# Patient Record
Sex: Male | Born: 1945
Health system: Southern US, Community
[De-identification: ages and names within clinical notes are randomized; demographics above are authoritative.]

## PROBLEM LIST (undated history)

## (undated) DIAGNOSIS — E785 Hyperlipidemia, unspecified: Secondary | ICD-10-CM

## (undated) DIAGNOSIS — M519 Unspecified thoracic, thoracolumbar and lumbosacral intervertebral disc disorder: Secondary | ICD-10-CM

## (undated) DIAGNOSIS — L409 Psoriasis, unspecified: Secondary | ICD-10-CM

## (undated) DIAGNOSIS — E119 Type 2 diabetes mellitus without complications: Secondary | ICD-10-CM

## (undated) DIAGNOSIS — R251 Tremor, unspecified: Secondary | ICD-10-CM

## (undated) DIAGNOSIS — I10 Essential (primary) hypertension: Secondary | ICD-10-CM

## (undated) DIAGNOSIS — E669 Obesity, unspecified: Secondary | ICD-10-CM

## (undated) HISTORY — DX: Essential (primary) hypertension: I10

## (undated) HISTORY — DX: Type 2 diabetes mellitus without complications: E11.9

## (undated) HISTORY — DX: Unspecified thoracic, thoracolumbar and lumbosacral intervertebral disc disorder: M51.9

## (undated) HISTORY — DX: Obesity, unspecified: E66.9

## (undated) HISTORY — DX: Psoriasis, unspecified: L40.9

## (undated) HISTORY — DX: Hyperlipidemia, unspecified: E78.5

## (undated) HISTORY — PX: LEG SURGERY: SHX1003

## (undated) HISTORY — PX: BACK SURGERY: SHX140

## (undated) HISTORY — DX: Tremor, unspecified: R25.1

---

## 1987-08-09 HISTORY — PX: LUMBAR LAMINECTOMY: SHX95

## 1998-04-12 ENCOUNTER — Encounter: Payer: Self-pay | Admitting: Neurosurgery

## 1998-04-12 ENCOUNTER — Ambulatory Visit (HOSPITAL_COMMUNITY): Admission: RE | Admit: 1998-04-12 | Discharge: 1998-04-12 | Payer: Self-pay | Admitting: Neurosurgery

## 2000-05-21 ENCOUNTER — Ambulatory Visit (HOSPITAL_COMMUNITY): Admission: RE | Admit: 2000-05-21 | Discharge: 2000-05-21 | Payer: Self-pay | Admitting: Neurosurgery

## 2000-05-21 ENCOUNTER — Encounter: Payer: Self-pay | Admitting: Neurosurgery

## 2000-09-19 ENCOUNTER — Encounter: Admission: RE | Admit: 2000-09-19 | Discharge: 2000-12-18 | Payer: Self-pay | Admitting: Emergency Medicine

## 2003-11-26 ENCOUNTER — Inpatient Hospital Stay (HOSPITAL_COMMUNITY): Admission: RE | Admit: 2003-11-26 | Discharge: 2003-11-27 | Payer: Self-pay | Admitting: Neurosurgery

## 2005-08-19 ENCOUNTER — Encounter (HOSPITAL_BASED_OUTPATIENT_CLINIC_OR_DEPARTMENT_OTHER): Admission: RE | Admit: 2005-08-19 | Discharge: 2005-09-20 | Payer: Self-pay | Admitting: Surgery

## 2006-06-21 ENCOUNTER — Encounter: Admission: RE | Admit: 2006-06-21 | Discharge: 2006-06-21 | Payer: Self-pay | Admitting: Neurosurgery

## 2007-09-11 ENCOUNTER — Encounter: Admission: RE | Admit: 2007-09-11 | Discharge: 2007-09-11 | Payer: Self-pay | Admitting: Endocrinology

## 2007-10-29 ENCOUNTER — Inpatient Hospital Stay (HOSPITAL_COMMUNITY): Admission: EM | Admit: 2007-10-29 | Discharge: 2007-11-12 | Payer: Self-pay | Admitting: Emergency Medicine

## 2007-11-06 ENCOUNTER — Ambulatory Visit: Payer: Self-pay | Admitting: Infectious Disease

## 2007-11-12 ENCOUNTER — Ambulatory Visit: Payer: Self-pay | Admitting: Surgery

## 2007-11-12 ENCOUNTER — Encounter (INDEPENDENT_AMBULATORY_CARE_PROVIDER_SITE_OTHER): Payer: Self-pay | Admitting: Internal Medicine

## 2008-09-03 ENCOUNTER — Inpatient Hospital Stay (HOSPITAL_COMMUNITY): Admission: EM | Admit: 2008-09-03 | Discharge: 2008-09-18 | Payer: Self-pay | Admitting: Emergency Medicine

## 2008-09-04 ENCOUNTER — Encounter (INDEPENDENT_AMBULATORY_CARE_PROVIDER_SITE_OTHER): Payer: Self-pay | Admitting: Internal Medicine

## 2008-09-04 ENCOUNTER — Ambulatory Visit: Payer: Self-pay | Admitting: Vascular Surgery

## 2008-09-10 ENCOUNTER — Ambulatory Visit: Payer: Self-pay | Admitting: Infectious Diseases

## 2008-09-10 ENCOUNTER — Encounter (INDEPENDENT_AMBULATORY_CARE_PROVIDER_SITE_OTHER): Payer: Self-pay | Admitting: Pulmonary Disease

## 2008-09-23 DIAGNOSIS — Z872 Personal history of diseases of the skin and subcutaneous tissue: Secondary | ICD-10-CM | POA: Insufficient documentation

## 2008-09-23 DIAGNOSIS — I10 Essential (primary) hypertension: Secondary | ICD-10-CM | POA: Insufficient documentation

## 2008-09-23 DIAGNOSIS — E119 Type 2 diabetes mellitus without complications: Secondary | ICD-10-CM | POA: Insufficient documentation

## 2008-09-23 DIAGNOSIS — E785 Hyperlipidemia, unspecified: Secondary | ICD-10-CM | POA: Insufficient documentation

## 2008-09-23 DIAGNOSIS — M109 Gout, unspecified: Secondary | ICD-10-CM | POA: Insufficient documentation

## 2008-09-25 ENCOUNTER — Ambulatory Visit: Payer: Self-pay | Admitting: Internal Medicine

## 2008-09-25 DIAGNOSIS — L97909 Non-pressure chronic ulcer of unspecified part of unspecified lower leg with unspecified severity: Secondary | ICD-10-CM | POA: Insufficient documentation

## 2008-09-25 DIAGNOSIS — R42 Dizziness and giddiness: Secondary | ICD-10-CM | POA: Insufficient documentation

## 2008-10-21 ENCOUNTER — Ambulatory Visit: Payer: Self-pay | Admitting: Internal Medicine

## 2009-03-09 ENCOUNTER — Ambulatory Visit: Payer: Self-pay | Admitting: Surgery

## 2009-03-09 ENCOUNTER — Inpatient Hospital Stay (HOSPITAL_COMMUNITY): Admission: EM | Admit: 2009-03-09 | Discharge: 2009-03-13 | Payer: Self-pay | Admitting: Emergency Medicine

## 2009-03-10 ENCOUNTER — Encounter (INDEPENDENT_AMBULATORY_CARE_PROVIDER_SITE_OTHER): Payer: Self-pay | Admitting: Internal Medicine

## 2009-11-20 ENCOUNTER — Encounter: Admission: RE | Admit: 2009-11-20 | Discharge: 2009-11-20 | Payer: Self-pay | Admitting: Neurosurgery

## 2010-08-29 ENCOUNTER — Encounter: Payer: Self-pay | Admitting: Neurosurgery

## 2010-11-13 LAB — DIFFERENTIAL
Basophils Absolute: 0 10*3/uL (ref 0.0–0.1)
Basophils Absolute: 0 10*3/uL (ref 0.0–0.1)
Basophils Relative: 1 % (ref 0–1)
Eosinophils Absolute: 0 10*3/uL (ref 0.0–0.7)
Eosinophils Relative: 1 % (ref 0–5)
Lymphocytes Relative: 22 % (ref 12–46)
Lymphocytes Relative: 29 % (ref 12–46)
Lymphs Abs: 1.5 10*3/uL (ref 0.7–4.0)
Lymphs Abs: 1.8 10*3/uL (ref 0.7–4.0)
Monocytes Absolute: 0.5 10*3/uL (ref 0.1–1.0)
Monocytes Relative: 7 % (ref 3–12)
Monocytes Relative: 9 % (ref 3–12)
Neutro Abs: 3.8 10*3/uL (ref 1.7–7.7)
Neutro Abs: 5 10*3/uL (ref 1.7–7.7)
Neutrophils Relative %: 61 % (ref 43–77)
Neutrophils Relative %: 71 % (ref 43–77)

## 2010-11-13 LAB — CBC
HCT: 35.8 % — ABNORMAL LOW (ref 39.0–52.0)
HCT: 35.9 % — ABNORMAL LOW (ref 39.0–52.0)
Hemoglobin: 12.1 g/dL — ABNORMAL LOW (ref 13.0–17.0)
Hemoglobin: 14.2 g/dL (ref 13.0–17.0)
MCHC: 33.5 g/dL (ref 30.0–36.0)
MCHC: 34 g/dL (ref 30.0–36.0)
MCV: 83.8 fL (ref 78.0–100.0)
MCV: 83.9 fL (ref 78.0–100.0)
Platelets: 209 10*3/uL (ref 150–400)
Platelets: 242 10*3/uL (ref 150–400)
Platelets: 244 10*3/uL (ref 150–400)
RBC: 4.29 MIL/uL (ref 4.22–5.81)
RBC: 5.09 MIL/uL (ref 4.22–5.81)
RDW: 13.6 % (ref 11.5–15.5)
RDW: 13.7 % (ref 11.5–15.5)
WBC: 6.2 10*3/uL (ref 4.0–10.5)
WBC: 6.7 10*3/uL (ref 4.0–10.5)

## 2010-11-13 LAB — CULTURE, BLOOD (ROUTINE X 2): Culture: NO GROWTH

## 2010-11-13 LAB — GLUCOSE, CAPILLARY
Glucose-Capillary: 114 mg/dL — ABNORMAL HIGH (ref 70–99)
Glucose-Capillary: 122 mg/dL — ABNORMAL HIGH (ref 70–99)
Glucose-Capillary: 129 mg/dL — ABNORMAL HIGH (ref 70–99)
Glucose-Capillary: 147 mg/dL — ABNORMAL HIGH (ref 70–99)
Glucose-Capillary: 147 mg/dL — ABNORMAL HIGH (ref 70–99)
Glucose-Capillary: 151 mg/dL — ABNORMAL HIGH (ref 70–99)
Glucose-Capillary: 153 mg/dL — ABNORMAL HIGH (ref 70–99)
Glucose-Capillary: 158 mg/dL — ABNORMAL HIGH (ref 70–99)
Glucose-Capillary: 158 mg/dL — ABNORMAL HIGH (ref 70–99)
Glucose-Capillary: 165 mg/dL — ABNORMAL HIGH (ref 70–99)
Glucose-Capillary: 172 mg/dL — ABNORMAL HIGH (ref 70–99)
Glucose-Capillary: 200 mg/dL — ABNORMAL HIGH (ref 70–99)

## 2010-11-13 LAB — BASIC METABOLIC PANEL
BUN: 18 mg/dL (ref 6–23)
BUN: 34 mg/dL — ABNORMAL HIGH (ref 6–23)
Calcium: 9.8 mg/dL (ref 8.4–10.5)
Chloride: 104 mEq/L (ref 96–112)
GFR calc Af Amer: >60 mL/min (ref >60)
Glucose, Bld: 114 mg/dL — ABNORMAL HIGH (ref 70–99)
Glucose, Bld: 131 mg/dL — ABNORMAL HIGH (ref 70–99)
Potassium: 3.7 mEq/L (ref 3.5–5.1)
Potassium: 3.7 mEq/L (ref 3.5–5.1)
Sodium: 136 mEq/L (ref 135–145)
Sodium: 136 mEq/L (ref 135–145)

## 2010-11-13 LAB — COMPREHENSIVE METABOLIC PANEL
Albumin: 2.9 g/dL — ABNORMAL LOW (ref 3.5–5.2)
BUN: 31 mg/dL — ABNORMAL HIGH (ref 6–23)
CO2: 25 mEq/L (ref 19–32)
Calcium: 8.9 mg/dL (ref 8.4–10.5)
Chloride: 102 mEq/L (ref 96–112)
Creatinine, Ser: 1.37 mg/dL (ref 0.4–1.5)
Total Bilirubin: 0.5 mg/dL (ref 0.3–1.2)

## 2010-11-13 LAB — WOUND CULTURE

## 2010-11-13 LAB — MRSA CULTURE

## 2010-11-13 LAB — SEDIMENTATION RATE: Sed Rate: 65 mm/hr — ABNORMAL HIGH (ref 0–16)

## 2010-11-13 LAB — C-REACTIVE PROTEIN: CRP: 12.3 mg/dL — ABNORMAL HIGH (ref <0.6)

## 2010-11-13 LAB — HEMOGLOBIN A1C
Hgb A1c MFr Bld: 6.5 % — ABNORMAL HIGH (ref 4.6–6.1)
Mean Plasma Glucose: 140 mg/dL

## 2010-11-22 LAB — GLUCOSE, CAPILLARY
Glucose-Capillary: 142 mg/dL — ABNORMAL HIGH (ref 70–99)
Glucose-Capillary: 156 mg/dL — ABNORMAL HIGH (ref 70–99)
Glucose-Capillary: 176 mg/dL — ABNORMAL HIGH (ref 70–99)
Glucose-Capillary: 184 mg/dL — ABNORMAL HIGH (ref 70–99)

## 2010-11-22 LAB — LIPID PANEL
HDL: 25 mg/dL — ABNORMAL LOW (ref >39)
Total CHOL/HDL Ratio: 3.6 RATIO
Triglycerides: 176 mg/dL — ABNORMAL HIGH (ref <150)

## 2010-11-22 LAB — CBC
HCT: 33.4 % — ABNORMAL LOW (ref 39.0–52.0)
HCT: 34 % — ABNORMAL LOW (ref 39.0–52.0)
HCT: 40.3 % (ref 39.0–52.0)
Hemoglobin: 11.2 g/dL — ABNORMAL LOW (ref 13.0–17.0)
Hemoglobin: 11.4 g/dL — ABNORMAL LOW (ref 13.0–17.0)
Hemoglobin: 13.5 g/dL (ref 13.0–17.0)
MCHC: 33.4 g/dL (ref 30.0–36.0)
MCHC: 33.5 g/dL (ref 30.0–36.0)
MCHC: 33.6 g/dL (ref 30.0–36.0)
MCV: 83.8 fL (ref 78.0–100.0)
MCV: 84.2 fL (ref 78.0–100.0)
MCV: 84.5 fL (ref 78.0–100.0)
Platelets: 250 10*3/uL (ref 150–400)
Platelets: 390 10*3/uL (ref 150–400)
RBC: 3.98 MIL/uL — ABNORMAL LOW (ref 4.22–5.81)
RDW: 14.6 % (ref 11.5–15.5)
RDW: 14.7 % (ref 11.5–15.5)
RDW: 14.8 % (ref 11.5–15.5)
WBC: 16.6 10*3/uL — ABNORMAL HIGH (ref 4.0–10.5)

## 2010-11-22 LAB — BASIC METABOLIC PANEL
BUN: 11 mg/dL (ref 6–23)
BUN: 29 mg/dL — ABNORMAL HIGH (ref 6–23)
CO2: 19 mEq/L (ref 19–32)
CO2: 23 mEq/L (ref 19–32)
CO2: 25 mEq/L (ref 19–32)
Calcium: 8.3 mg/dL — ABNORMAL LOW (ref 8.4–10.5)
Calcium: 8.5 mg/dL (ref 8.4–10.5)
Chloride: 102 mEq/L (ref 96–112)
Chloride: 92 mEq/L — ABNORMAL LOW (ref 96–112)
Creatinine, Ser: 1.07 mg/dL (ref 0.4–1.5)
Creatinine, Ser: 1.08 mg/dL (ref 0.4–1.5)
GFR calc Af Amer: >60 mL/min (ref >60)
GFR calc Af Amer: >60 mL/min (ref >60)
GFR calc non Af Amer: >60 mL/min (ref >60)
Glucose, Bld: 182 mg/dL — ABNORMAL HIGH (ref 70–99)
Glucose, Bld: 193 mg/dL — ABNORMAL HIGH (ref 70–99)
Potassium: 3.1 mEq/L — ABNORMAL LOW (ref 3.5–5.1)
Potassium: 3.8 mEq/L (ref 3.5–5.1)
Sodium: 133 mEq/L — ABNORMAL LOW (ref 135–145)

## 2010-11-22 LAB — CULTURE, BLOOD (ROUTINE X 2): Culture: NO GROWTH

## 2010-11-22 LAB — WOUND CULTURE

## 2010-11-22 LAB — HEMOGLOBIN A1C
Hgb A1c MFr Bld: 6.5 % — ABNORMAL HIGH (ref 4.6–6.1)
Mean Plasma Glucose: 140 mg/dL

## 2010-11-22 LAB — DIFFERENTIAL
Basophils Absolute: 0 10*3/uL (ref 0.0–0.1)
Basophils Absolute: 0.2 10*3/uL — ABNORMAL HIGH (ref 0.0–0.1)
Basophils Relative: 0 % (ref 0–1)
Basophils Relative: 1 % (ref 0–1)
Eosinophils Absolute: 0 10*3/uL (ref 0.0–0.7)
Eosinophils Absolute: 0 10*3/uL (ref 0.0–0.7)
Eosinophils Absolute: 0 10*3/uL (ref 0.0–0.7)
Eosinophils Relative: 0 % (ref 0–5)
Eosinophils Relative: 0 % (ref 0–5)
Lymphocytes Relative: 7 % — ABNORMAL LOW (ref 12–46)
Lymphs Abs: 1.2 10*3/uL (ref 0.7–4.0)
Lymphs Abs: 1.3 10*3/uL (ref 0.7–4.0)
Monocytes Absolute: 0.3 10*3/uL (ref 0.1–1.0)
Monocytes Absolute: 1.2 10*3/uL — ABNORMAL HIGH (ref 0.1–1.0)
Monocytes Absolute: 1.7 10*3/uL — ABNORMAL HIGH (ref 0.1–1.0)
Monocytes Relative: 10 % (ref 3–12)
Neutro Abs: 15.7 10*3/uL — ABNORMAL HIGH (ref 1.7–7.7)

## 2010-11-23 LAB — GLUCOSE, CAPILLARY
Glucose-Capillary: 109 mg/dL — ABNORMAL HIGH (ref 70–99)
Glucose-Capillary: 113 mg/dL — ABNORMAL HIGH (ref 70–99)
Glucose-Capillary: 115 mg/dL — ABNORMAL HIGH (ref 70–99)
Glucose-Capillary: 127 mg/dL — ABNORMAL HIGH (ref 70–99)
Glucose-Capillary: 128 mg/dL — ABNORMAL HIGH (ref 70–99)
Glucose-Capillary: 146 mg/dL — ABNORMAL HIGH (ref 70–99)
Glucose-Capillary: 147 mg/dL — ABNORMAL HIGH (ref 70–99)
Glucose-Capillary: 148 mg/dL — ABNORMAL HIGH (ref 70–99)
Glucose-Capillary: 152 mg/dL — ABNORMAL HIGH (ref 70–99)
Glucose-Capillary: 163 mg/dL — ABNORMAL HIGH (ref 70–99)
Glucose-Capillary: 163 mg/dL — ABNORMAL HIGH (ref 70–99)
Glucose-Capillary: 163 mg/dL — ABNORMAL HIGH (ref 70–99)
Glucose-Capillary: 170 mg/dL — ABNORMAL HIGH (ref 70–99)
Glucose-Capillary: 172 mg/dL — ABNORMAL HIGH (ref 70–99)
Glucose-Capillary: 175 mg/dL — ABNORMAL HIGH (ref 70–99)
Glucose-Capillary: 177 mg/dL — ABNORMAL HIGH (ref 70–99)
Glucose-Capillary: 178 mg/dL — ABNORMAL HIGH (ref 70–99)
Glucose-Capillary: 188 mg/dL — ABNORMAL HIGH (ref 70–99)
Glucose-Capillary: 188 mg/dL — ABNORMAL HIGH (ref 70–99)
Glucose-Capillary: 190 mg/dL — ABNORMAL HIGH (ref 70–99)
Glucose-Capillary: 193 mg/dL — ABNORMAL HIGH (ref 70–99)
Glucose-Capillary: 195 mg/dL — ABNORMAL HIGH (ref 70–99)
Glucose-Capillary: 196 mg/dL — ABNORMAL HIGH (ref 70–99)
Glucose-Capillary: 198 mg/dL — ABNORMAL HIGH (ref 70–99)
Glucose-Capillary: 203 mg/dL — ABNORMAL HIGH (ref 70–99)
Glucose-Capillary: 212 mg/dL — ABNORMAL HIGH (ref 70–99)
Glucose-Capillary: 217 mg/dL — ABNORMAL HIGH (ref 70–99)

## 2010-11-23 LAB — BASIC METABOLIC PANEL
BUN: 13 mg/dL (ref 6–23)
BUN: 13 mg/dL (ref 6–23)
CO2: 25 mEq/L (ref 19–32)
CO2: 26 mEq/L (ref 19–32)
CO2: 27 mEq/L (ref 19–32)
CO2: 27 mEq/L (ref 19–32)
Calcium: 8.1 mg/dL — ABNORMAL LOW (ref 8.4–10.5)
Calcium: 8.2 mg/dL — ABNORMAL LOW (ref 8.4–10.5)
Calcium: 8.3 mg/dL — ABNORMAL LOW (ref 8.4–10.5)
Calcium: 8.7 mg/dL (ref 8.4–10.5)
Chloride: 93 mEq/L — ABNORMAL LOW (ref 96–112)
Chloride: 96 mEq/L (ref 96–112)
Chloride: 98 mEq/L (ref 96–112)
Creatinine, Ser: 0.98 mg/dL (ref 0.4–1.5)
Creatinine, Ser: 1.07 mg/dL (ref 0.4–1.5)
Creatinine, Ser: 1.14 mg/dL (ref 0.4–1.5)
GFR calc Af Amer: >60 mL/min (ref >60)
GFR calc Af Amer: >60 mL/min (ref >60)
GFR calc Af Amer: >60 mL/min (ref >60)
GFR calc Af Amer: >60 mL/min (ref >60)
GFR calc Af Amer: >60 mL/min (ref >60)
GFR calc non Af Amer: >60 mL/min (ref >60)
GFR calc non Af Amer: >60 mL/min (ref >60)
Glucose, Bld: 220 mg/dL — ABNORMAL HIGH (ref 70–99)
Glucose, Bld: 220 mg/dL — ABNORMAL HIGH (ref 70–99)
Potassium: 4.4 mEq/L (ref 3.5–5.1)
Sodium: 130 mEq/L — ABNORMAL LOW (ref 135–145)
Sodium: 131 mEq/L — ABNORMAL LOW (ref 135–145)
Sodium: 133 mEq/L — ABNORMAL LOW (ref 135–145)

## 2010-11-23 LAB — ANAEROBIC CULTURE

## 2010-11-23 LAB — DIFFERENTIAL
Basophils Absolute: 0 10*3/uL (ref 0.0–0.1)
Basophils Absolute: 0 10*3/uL (ref 0.0–0.1)
Basophils Absolute: 0 10*3/uL (ref 0.0–0.1)
Basophils Absolute: 0.1 10*3/uL (ref 0.0–0.1)
Basophils Relative: 0 % (ref 0–1)
Basophils Relative: 0 % (ref 0–1)
Basophils Relative: 1 % (ref 0–1)
Eosinophils Absolute: 0.1 10*3/uL (ref 0.0–0.7)
Eosinophils Absolute: 0.1 10*3/uL (ref 0.0–0.7)
Eosinophils Absolute: 0.1 10*3/uL (ref 0.0–0.7)
Eosinophils Absolute: 0.1 10*3/uL (ref 0.0–0.7)
Eosinophils Absolute: 0.2 10*3/uL (ref 0.0–0.7)
Eosinophils Relative: 1 % (ref 0–5)
Eosinophils Relative: 2 % (ref 0–5)
Lymphocytes Relative: 10 % — ABNORMAL LOW (ref 12–46)
Lymphocytes Relative: 10 % — ABNORMAL LOW (ref 12–46)
Lymphs Abs: 2.1 10*3/uL (ref 0.7–4.0)
Monocytes Absolute: 0.6 10*3/uL (ref 0.1–1.0)
Monocytes Absolute: 0.6 10*3/uL (ref 0.1–1.0)
Monocytes Absolute: 1.2 10*3/uL — ABNORMAL HIGH (ref 0.1–1.0)
Monocytes Absolute: 1.8 10*3/uL — ABNORMAL HIGH (ref 0.1–1.0)
Monocytes Relative: 10 % (ref 3–12)
Monocytes Relative: 10 % (ref 3–12)
Monocytes Relative: 8 % (ref 3–12)
Neutro Abs: 11.8 10*3/uL — ABNORMAL HIGH (ref 1.7–7.7)
Neutro Abs: 14.1 10*3/uL — ABNORMAL HIGH (ref 1.7–7.7)
Neutro Abs: 16 10*3/uL — ABNORMAL HIGH (ref 1.7–7.7)
Neutro Abs: 4.5 10*3/uL (ref 1.7–7.7)
Neutrophils Relative %: 82 % — ABNORMAL HIGH (ref 43–77)
Neutrophils Relative %: 82 % — ABNORMAL HIGH (ref 43–77)
Neutrophils Relative %: 83 % — ABNORMAL HIGH (ref 43–77)

## 2010-11-23 LAB — CBC
HCT: 30.2 % — ABNORMAL LOW (ref 39.0–52.0)
HCT: 33.4 % — ABNORMAL LOW (ref 39.0–52.0)
HCT: 33.8 % — ABNORMAL LOW (ref 39.0–52.0)
HCT: 34 % — ABNORMAL LOW (ref 39.0–52.0)
HCT: 34.6 % — ABNORMAL LOW (ref 39.0–52.0)
Hemoglobin: 10.4 g/dL — ABNORMAL LOW (ref 13.0–17.0)
Hemoglobin: 11.2 g/dL — ABNORMAL LOW (ref 13.0–17.0)
Hemoglobin: 11.3 g/dL — ABNORMAL LOW (ref 13.0–17.0)
Hemoglobin: 11.6 g/dL — ABNORMAL LOW (ref 13.0–17.0)
Hemoglobin: 11.7 g/dL — ABNORMAL LOW (ref 13.0–17.0)
Hemoglobin: 11.7 g/dL — ABNORMAL LOW (ref 13.0–17.0)
MCHC: 33.1 g/dL (ref 30.0–36.0)
MCHC: 33.4 g/dL (ref 30.0–36.0)
MCHC: 33.8 g/dL (ref 30.0–36.0)
MCHC: 33.8 g/dL (ref 30.0–36.0)
MCV: 83.2 fL (ref 78.0–100.0)
MCV: 83.5 fL (ref 78.0–100.0)
MCV: 83.6 fL (ref 78.0–100.0)
MCV: 83.6 fL (ref 78.0–100.0)
MCV: 83.7 fL (ref 78.0–100.0)
MCV: 83.8 fL (ref 78.0–100.0)
MCV: 83.9 fL (ref 78.0–100.0)
Platelets: 537 10*3/uL — ABNORMAL HIGH (ref 150–400)
Platelets: 554 10*3/uL — ABNORMAL HIGH (ref 150–400)
Platelets: 617 10*3/uL — ABNORMAL HIGH (ref 150–400)
Platelets: 647 10*3/uL — ABNORMAL HIGH (ref 150–400)
Platelets: 685 10*3/uL — ABNORMAL HIGH (ref 150–400)
Platelets: 689 10*3/uL — ABNORMAL HIGH (ref 150–400)
RBC: 3.59 MIL/uL — ABNORMAL LOW (ref 4.22–5.81)
RBC: 3.85 MIL/uL — ABNORMAL LOW (ref 4.22–5.81)
RBC: 3.94 MIL/uL — ABNORMAL LOW (ref 4.22–5.81)
RBC: 4.06 MIL/uL — ABNORMAL LOW (ref 4.22–5.81)
RBC: 4.11 MIL/uL — ABNORMAL LOW (ref 4.22–5.81)
RBC: 4.18 MIL/uL — ABNORMAL LOW (ref 4.22–5.81)
RDW: 14.7 % (ref 11.5–15.5)
RDW: 14.8 % (ref 11.5–15.5)
RDW: 15 % (ref 11.5–15.5)
RDW: 15 % (ref 11.5–15.5)
RDW: 15.1 % (ref 11.5–15.5)
WBC: 11 10*3/uL — ABNORMAL HIGH (ref 4.0–10.5)
WBC: 6.4 10*3/uL (ref 4.0–10.5)
WBC: 9.8 10*3/uL (ref 4.0–10.5)

## 2010-11-23 LAB — CULTURE, ROUTINE-ABSCESS

## 2010-11-23 LAB — URINALYSIS, ROUTINE W REFLEX MICROSCOPIC
Leukocytes, UA: NEGATIVE
Nitrite: NEGATIVE
Protein, ur: 30 mg/dL — AB
Specific Gravity, Urine: 1.018 (ref 1.005–1.030)
Urobilinogen, UA: 1 mg/dL (ref 0.0–1.0)

## 2010-11-23 LAB — CULTURE, BLOOD (ROUTINE X 2): Culture: NO GROWTH

## 2010-11-23 LAB — URINE MICROSCOPIC-ADD ON

## 2010-11-23 LAB — URINE CULTURE: Special Requests: NEGATIVE

## 2010-12-21 NOTE — Group Therapy Note (Signed)
James, Roberson NO.:  [id number]   MEDICAL RECORD NO.:  [id number]          PATIENT TYPE:  INP   LOCATION:  1509                         FACILITY:  [facility]   PHYSICIAN:  Altha Harm, MDDATE OF BIRTH:  1946-04-20                                 PROGRESS NOTE   DISCHARGE DIAGNOSES:  1. Cellulitis of right lower extremity.  2. Abscess of right lower extremity, status post incision and      drainage.  3. Diabetes type 2, uncontrolled.  4. Hypertension.  5. Leukocytosis.   DISCHARGE MEDICATIONS:  To be determined at the time of discharge.   CONSULTATIONS:  Dr. Johna Sheriff, surgery.   PROCEDURE:  1. Status post culture of right wound at time of admission.  2. I and D of abscess on the right lower extremity just inferior to      the right medial malleolus.   DIAGNOSTIC STUDIES:  1. X-ray of the right foot and leg, which shows diffuse soft tissue      swelling without acute or specific findings.  2. MR of the right lower extremity done on the 27th at admission,      which shows diffuse subcutaneous soft tissue swelling or edema      without discrete soft tissue lesions.  No findings for myofascitis      or osteomyelitis.  Findings suspicious for DVT.  Recommend venous      Doppler examination.  3. Chest x-ray, 2 view, done on February 1, which shows no pneumonia      and low lung volumes.  Repeat MR of the lower extremity done today,      February 2, which shows progressive right lower leg cellulitis.      Proximal to the surgical incision, medial of the ankle lesion,      there is an enlarging and ill-defined fluid collection within the      anterior and medial subcutaneous fat, suspicious for a superficial      abscess.  No evidence of deep abscess or osteomyelitis.  4. Doppler ultrasound of the lower extremities done on January 28th,      which shows no evidence of DVT, superficial thrombosis, or baker's      cyst.   CODE STATUS:  Full code.   ALLERGIES:  1. MOXIFLOXACIN.  2. ADHESIVE.   CHIEF COMPLAINT:  Rapidly progressive right lower extremity cellulitis over the past 48  hours prior to admission.   HISTORY OF PRESENT ILLNESS:  Please refer to the H and P, performed by Dr. Ardyth Harps, for details of  the HPI.   HOSPITAL COURSE:  1. Rapidly progressive right lower extremity cellulitis:  The patient      was admitted with right lower extremity cellulitis.  The patient      was started on vancomycin and Cefepime at the time of admission.      He initially had some improvement in the cellulitis of the right      lower extremity; however, within 48 hours, the patient had      developed an area of superficial abscess on the right leg  just      inferior to the medial malleolus.  I asked Dr. Derrell Lolling, who is on      call for surgery, to come back and see the patient.  He did a      bedside I and D on the patient with drainage of serosanguineous      fluid with no purulent fluids.  All of the cultures that were taken      thus far have been negative.  The patient has continued to have an      elevated white blood cell count, despite the antibiotics.      Antibiotics were changed from vancomycin and cefepime to vancomycin      and imipenem.  MRI was repeated on the lower extremity today, which      shows progression of the cellulitis and another abscess formation,      superficial, just above the area that was incised and drained on      the 31st.  I spoke to Dr. Johna Sheriff, and his plan is to take the      patient down to surgery for an I and D under anesthesia.  There is      no evidence of myofascitis or osteomyelitis at this time; however,      the cellulitis of the right lower extremity is quite impressive.      In light of the persistent leukocytosis, despite antibiotics, I      have asked Dr. Maurice March of infectious disease to see the patient, and I      anticipate a consult by him today on this patient.  We also looked      at a  chest x-ray to look for other sources of infection, and a      chest x-ray is negative.  A urinalysis is being sent on this      patient to hopefully evaluate his urine for evidence of a urinary      tract infection; however, I suspect that the persistent      leukocytosis is all related to the cellulitis of the right lower      extremity.  2. Diabetes type 2:  The patient is usually well controlled on his      usual regimen of medications; however, with his infection, his      diabetes has been out of control with his blood pressures going      into the 200's.  I am presently in the process of changing the      patient over to an insulin drip with a glucose stabilizer for      better control of his blood sugars.  3. Hypertension:  This has been well controlled during this      hospitalization.  4. Hyperlipidemia:  Patient is on his usual medications.   In terms of his diet, the patient has been on a medium carb-modified,  heart-healthy diet.  In terms of physical restrictions, the patient has  been essentially on bedrest with lower extremities elevated.  He has  been on GI and DVT prophylaxis with Lovenox and Protonix.   This brings [place] up to date on this patient's hospital course at this  point.      Altha Harm, MD  Electronically Signed     MAM/MEDQ  D:  09/09/2008  T:  09/09/2008  Job:  [id number]   cc:   [name], M.D.  Fax: [phone]

## 2010-12-21 NOTE — Op Note (Signed)
NAMEBASTION, BOLGER NO.:  [id number]   MEDICAL RECORD NO.:  [id number]          PATIENT TYPE:  INP   LOCATION:  1509                         FACILITY:  [facility]   PHYSICIAN:  Angelia Mould. Derrell Lolling, M.D.DATE OF BIRTH:  03-Mar-1946   DATE OF PROCEDURE:  09/07/2008  DATE OF DISCHARGE:                               OPERATIVE REPORT   PREOPERATIVE DIAGNOSIS:  Soft tissue abscess right ankle with  cellulitis.   POSTOPERATIVE DIAGNOSIS:  Soft tissue abscess right ankle with  cellulitis.   OPERATION PERFORMED:  Incision and drainage of right ankle soft tissue  abscess with debridement of skin and subcutaneous tissue.   SURGEON:  Angelia Mould. Derrell Lolling, M.D.   OPERATIVE INDICATIONS:  This is a 65 year old white male with non-  insulin-dependent diabetes mellitus.  He has a history of soft tissue  infections of his right lower extremity dating to March of 2009  requiring debridement of an ulcer along his medial ankle by Dr. Colin Benton at  that time.  He was readmitted this time on January 27.  He was seen by  Dr. Glenna Fellows who evaluated him and noted that he had cellulitis  of his right lower extremity from the knee down.  Doppler studies were  negative for deep venous thrombosis.  MRI is negative for deep space  infection.  I was on call this weekend and was called by the medical  hospitalist for a focal area on the medial right ankle with concern for  soft tissue infection.  I came to evaluate him shortly thereafter.   OPERATIVE TECHNIQUE:  I first removed all of the bandages and examined  the right lower extremity.  He has cellulitis from the knee down to the  foot.  The edema in the calf is resolving nicely.  He has significant  edema of the entire foot.  He has a small ulcer near the medial  malleolus from his previous debridement.  Inferior to this there is an  area of about 2.5 cm of palpable fluid collection.  There is no  overlying skin necrosis.  This area is  tender.   I then removed all of the bandages and prepped and draped the right  foot.  Xylocaine 1% with epinephrine was used as a local infiltration  anesthetic.  Using a knife I incised into this area and got a bloody  thick fluid without any odor.  A culture was obtained at this point.  I  then debrided all of the overlying skin and subcutaneous tissue which  made the wound almost 2 cm in diameter.  The abscess then tracked  superiorly and I simply slit the skin superiorly and folded it back so  that I could make sure that I had unroofed the entire abscess cavity.  This appeared to have taken  care of the entire abscess.  This was irrigated out and then packed with  saline moistened gauze.  We then redressed the entire extremity with  Silvadene on the chronic open areas.  The patient tolerated the  procedure well.  Estimated blood loss was about 5 mL.  Complications  none.      Angelia Mould. Derrell Lolling, M.D.  Electronically Signed     HMI/MEDQ  D:  09/07/2008  T:  09/07/2008  Job:  [id number]   cc:   Lorne Skeens. Hoxworth, M.D.  [address], [address]  [place]  [place] [address]

## 2010-12-21 NOTE — Consult Note (Signed)
NAMEMarland Roberson  ALIM, CATTELL NO.:  [id number]   MEDICAL RECORD NO.:  [id number]          PATIENT TYPE:  EMS   LOCATION:  ED                           FACILITY:  [facility]   PHYSICIAN:  Lorne Skeens. Hoxworth, M.D.DATE OF BIRTH:  [birth year]   DATE OF CONSULTATION:  DATE OF DISCHARGE:                                 CONSULTATION   CHIEF COMPLAINT:  Pain, swelling, and redness right lower extremity.   HISTORY OF PRESENT ILLNESS:  I was asked by the emergency room PA at  [facility] to evaluate James Roberson.  He is a 65 year old white male with  non-insulin dependent diabetes mellitus.  He has a history of a soft  tissue infection of his right lower extremity in March 2009.  This  apparently was MRSA, according to records, and he did require  debridement of an ulcer along his medial right ankle by Dr. Colin Benton at  that time.  The infection cleared up although the patient states the  wound has never healed and he has been followed in PT and by Dr. Juleen China.  He states he was in his usual state of health until just about 24 hours  ago when he noted some redness and tenderness along his posterior right  calf.  He saw Dr. Juleen China, at that time and was started on oral Keflex.  He states over the last 24 hours he has had rapid worsening of his leg  with increasing redness, swelling and pain involving essentially his  entire right lower extremity from the knee down, and then more recently  some redness around his medial thigh and groin as well.  He has had  fever, he states up to 102 degrees at home.  He presented to the  emergency room this afternoon.  He is being admitted by [facility]  [facility].  I was asked to see the patient in consultation.   PAST MEDICAL HISTORY:  Surgery, is significant as above, plus history of  cervical laminectomy and lumbar laminectomy on 3 occasions in years  past.  Medically, he is followed for diabetes, hypertension, gout,  hyperlipidemia.   MEDICATIONS ON  ADMISSION:  1. Aleve twice daily.  2. Allopurinol 100 mg daily  3. Metformin HCl ER 500 mg twice daily.  4. Ambien 10 mg at bedtime.  5. Cephalexin started yesterday.  6. Diovan 320/25 daily.  7. Januvia 100 mg daily.  8. Lipitor 40 daily.  9. Soriatane 25 mg daily.  10.Vicodin p.r.n.   ALLERGIES:  AVELOX has caused anaphylaxis.   SOCIAL HISTORY:  He is married, accompanied by his wife.  Nonsmoker.  Occasional alcohol.   FAMILY HISTORY:  Noncontributory.   REVIEW OF SYSTEMS:  GENERAL:  Positive for fever and malaise with this  illness.  HEENT:  No vision, hearing, swallowing problems.  RESPIRATORY:  Some mild cough, nonproductive no shortness of breath.  CARDIAC:  No  chest pain, palpitations and denies history of heart disease.  ABDOMEN:  Some nausea with this illness.  No abdominal pain or change in bowels.  EXTREMITIES:  As above.  NEUROLOGIC:  No numbness, weakness, syncope.  PHYSICAL EXAM:  VITAL SIGNS:  Temperature is 97.5, heart rate 114,  respirations 20, blood pressure 134/84.  GENERAL:  Alert, well developed and in no acute distress.  SKIN:  Warm and dry.  See extremities.  HEENT:  No palpable mass or thyromegaly.  Sclerae nonicteric.  Oropharynx clear.  LUNGS:  Clear without wheezing, increased work of breathing with no  cervical, subclavicular or inguinal nodes palpable.  CARDIAC:  Regular tachycardia.  No murmur.  No JVD.  ABDOMEN:  Mildly obese, soft, nontender.  No masses.  EXTREMITIES:  There is marked cellulitis involving the right lower  extremity from the knee to the ankle with relative sparing of the foot.  There is 2+ edema of the same area.  There is superficial desquamation  over the calf.  There are a couple of small superficial bullae along the  lateral ankle with apparent purulent material beneath these.  There is a  2 cm clean base chronic-appearing ulcer of the medial ankle generally  away from the center of the cellulitis and this appears clean.   There is  no apparent necrotic tissue on exam.  His calf is moderately tender.  NEUROVASCULAR:  Intact with good dorsiflexion and plantar flexion.  Good  sensation of the foot and are bounding pedal pulses.   LABORATORY:  White count of 17.4, hemoglobin 13.5, platelets 250,000.  Electrolytes BUN and creatinine abnormal for BUN of 29, glucose is 182.  Plain tib-fib x-rays of right lower extremity negative except for soft  tissue swelling.   ASSESSMENT/PLAN:  Severe cellulitis of the right lower extremity, rapid  onset over the past 24 hours in this diabetic patient with history of  methicillin-resistant Staphylococcus aureus infection of the right lower  extremity.  This likely represents a methicillin-resistant  Staphylococcus aureus or strep infection.  At this point I do not see  any necrotic tissue or indication for immediate debridement.  I am going  to I and D the superficial bulla on the lateral right ankle and culture  this fluid.  Will obtain MRI of the lower extremity, look for any  evidence of deep abscess.  I would get Doppler ultrasound to look for  any evidence of DVT.  The patient being started on broad-spectrum  antibiotics to include gram positive coverage with vancomycin.  Will be  observed closely.  If he fails to respond quickly, may well require  surgical exploration and/or debridement.      Lorne Skeens. Hoxworth, M.D.  Electronically Signed     BTH/MEDQ  D:  09/03/2008  T:  09/03/2008  Job:  [id number]

## 2010-12-21 NOTE — Discharge Summary (Signed)
James Roberson, James Roberson NO.:  [id number]   MEDICAL RECORD NO.:  [id number]          PATIENT TYPE:  INP   LOCATION:  1304                         FACILITY:  [facility]    PHYSICIAN:  Hillery Aldo, M.D.   DATE OF BIRTH:  23-Jul-1946   DATE OF ADMISSION:  03/09/2009  DATE OF DISCHARGE:  03/13/2009                               DISCHARGE SUMMARY   PRIMARY CARE PHYSICIAN:  [name], M.D.   DERMATOLOGIST:  Ria Bush. Jorja Loa, M.D.   DISCHARGE DIAGNOSES:  1. 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  2. Pustular psoriasis.  3. Diabetes mellitus.  4. Hypertension.  5. Dyslipidemia.  6. History of gout.  7. Stage 2 chronic kidney disease.  8. Hypokalemia.  9. Mild liver function tests elevation.  10.Normocytic anemia.   DISCHARGE MEDICATIONS:  1. Avelox 400 mg daily x2 weeks.  2. Bactroban topically b.i.d. p.r.n. to any open sores.  3. Soriatane 25 mg p.o. daily.  4. Methoxsalen 1% with phototherapy topically 3 times per week.  5. Lipitor 40 mg p.o. daily.  6. Metformin 500 mg p.o. b.i.d.  7. Januvia 100 mg p.o. daily.  8. Allopurinol 100 mg p.o. daily.  9. Diovan/HCT 320/25 one tablet p.o. daily.  10.Percocet 5/325 two tablets p.o. q.6 h. p.r.n. pain.  11.Ambien 10 mg p.o. q.h.s. p.r.n. sleep.  12.Vitamin D 1000 international units p.o. daily.  13.Fish oil 1200 mg p.o. daily.  14.Aleve 2 tablets p.o. b.i.d. p.r.n.   CONSULTATIONS:  Vania Rea. Supple, M.D. of Orthopedics.   BRIEF ADMISSION HISTORY OF PRESENT ILLNESS:  The patient is a 65-year-  old male with past medical history of right lower extremity infection  with cellulitis, diabetic foot ulcer involving this area with a history  of incision and drainage as well as debridement of the subcutaneous  tissue and fascia dating back to November 05, 2007.  The patient presented  to the hospital for evaluation of increasing erythema and pain of the  right lower  extremity.  Upon review of his old medical records, initial  cultures dating back to October 29, 2007 were positive for methicillin-  sensitive staph aureus.  All subsequent cultures have been negative.  The patient was referred to the hospitalist service for IV antibiotics  for treatment of acute cellulitis.  For the full details, please see the  dictated report done by Dr. Selena Batten.   PROCEDURES/DIAGNOSTIC STUDIES:  1. Right ankle films on March 09, 2009 showed soft tissue swelling.  2. Right tibia-fibula films on March 09, 2009 showed no acute bony      abnormalities of the right tibia or fibula.  3. Right lower extremity Doppler studies on March 10, 2009 were      negative for DVT or superficial thrombosis.  4. MRI of the right lower extremity on March 10, 2009 showed diffuse      subcutaneous edema and mild enhancement compatible with      residual/recurrent cellulitis.  Compared with prior examination 6      months earlier, this has improved.  No evidence of significant  myofasciitis or soft tissue abscess.  No evidence of osteomyelitis.   LABORATORY DATA:  Discharge laboratory values:  Nasal staph screen was  negative for staph aureus.  Wound cultures grew broadly sensitive staph  aureus.  The only antibiotic it was resistant to was penicillin.  Blood  cultures were negative x2.  Sodium was 136, potassium 3.7, chloride 104,  bicarb 26, BUN 18, creatinine 1.22, glucose 131, calcium 8.9, white  blood cell count was 6.7, hemoglobin 12.1, hematocrit 35.9, platelets  242.  C. reactive protein was elevated at 12.3.  Hemoglobin A1c was  6.5%.  ESR was 65.   HOSPITAL COURSE:  1. Right lower extremity cellulitis:  The patient has recurrent      methicillin-sensitive staph aureus cellulitis.  He was initially      empirically treated with broad-spectrum antibiotic therapy      including vancomycin and Zosyn.  When culture results were      finalized, his antibiotic therapy was narrowed to  vancomycin.      Doppler studies were negative for concurrent deep venous      thrombosis.  The patient has severe pustular psoriasis and has skin      that sloughs and occasionally develops denuded skin which serves as      the portal of entry.  Given this as well as his diabetes, he s at      risk for recurrent problems with infection.  At this time, we do      recommend 2 weeks of empiric treatment with Avelox.  If he develops      any further problems with cellulitis, would consider long-term      suppressive therapy.  In the interim, would recommend attempts at      decontamination of carrier status with biweekly bleach baths, one      full tub bath with one-quarter cup of bleach, daily showers with      antibacterial soap, and Bactroban ointment to any open sores.  The      patient was encouraged to continue to travel and if necessary, to      consult with his primary care physician for a prescription of      antibiotics to take with him should he develop any problems with      recurrent cellulitis.  2. Psoriasis:  The patient has severe pustular psoriasis.  He is under      the care of Dr. Jorja Loa and receives [employer].  Telephone      consultation was made with Dr. Jorja Loa regarding appropriate      treatment.  Dr. Jorja Loa did recommend ongoing therapy with Soritane      given that it has no immunosuppressive effects.  He also      recommended the twice weekly tub baths with one-quarter cup of      bleach.  The patient was maintained on Soritane and appears to have      reasonably good control of his outbreak.  3. Diabetes:  The patient does have excellent outpatient control given      his hemoglobin A1c of 6.5%.  He had some elevation of blood      glucoses while in the hospital which is likely attributable to his      acute infection.  The patient was put on moderate sliding-scale      insulin while in the hospital an addition to his metformin.  4. Hypertension:  The patient's  blood pressure was well controlled  throughout his hospital stay.  The hydrochlorothiazide was held      secondary to mild renal insufficiency.  This is felt to be chronic      in nature.  5. Dyslipidemia:  The patient was maintained on his usual dose of      statin and fish oil therapy.  6. Gout:  The patient was maintained on Allopurinol with no acute      gouty flares during this hospital stay.  7. Stage II chronic kidney disease:  The patient does have mild renal      insufficiency consistent with a diagnosis of stage II chronic      kidney disease.  The patient's hydrochlorothiazide was held.  His      creatinine decreased from 1.37-1.22 where it stabilized.  His      underlying stage II chronic kidney disease is likely due to      diabetic nephropathy.  He should be monitored closely as an      outpatient with appropriate referral to a nephrologist as needed.  8. Hypokalemia:  The patient's potassium was appropriately repleted.  9. Mild LFT elevation:  The patient did have liver function tests done      on admission which showed mild elevation of LFTs.  This is most      likely a side effect of Soritane.  He is also on statin therapy.      His transaminases were approximately two times normal and not      indicative of discontinuing medications.  10.Normocytic anemia:  The patient does have mild normocytic anemia      which is likely due to chronic disease.  No further diagnostic      evaluation was undertaken during the course of his hospital stay.   DISPOSITION:  The patient is medically stable.   DISCHARGE INSTRUCTIONS:  He will be discharged home with an additional 2  weeks of therapy with Avelox.  He is instructed to apply Bactroban to  any open sores two times per day.  He is instructed to bathe in a full  tub of water with one-quarter cup of bleach two times per week.  He is  encouraged to shower daily with antibacterial soap.  He is encouraged to  contact his primary  care physician for any plans to travel out of the  country and to receive a supply of antibiotic therapy for travel  purposes should he have a recurrence of cellulitis.  He is instructed to  follow up with Dr. Jorja Loa as usual and to follow up with Dr. Juleen China as  needed.   DISCHARGE DIET:  Diabetic, low-sodium, heart-healthy.   ACTIVITY:  No restrictions on activity.   CONDITION ON DISCHARGE:  Improved.   Time spent coordinating care for discharge and discharge instructions  equals 35 minutes.      Hillery Aldo, M.D.  Electronically Signed     CR/MEDQ  D:  03/13/2009  T:  03/13/2009  Job:  [id number]   cc:   [name], M.D.  Fax: [phone]   Ria Bush. Jorja Loa, M.D.  Fax: [phone]

## 2010-12-21 NOTE — Op Note (Signed)
[name], [name] NO.:  [id number]   MEDICAL RECORD NO.:  [id number]          PATIENT TYPE:  INP   LOCATION:  1509                         FACILITY:  [facility]   PHYSICIAN:  Sharlet Salina T. Hoxworth, M.D.DATE OF BIRTH:  May 01, 1946   DATE OF PROCEDURE:  09/16/2008  DATE OF DISCHARGE:                               OPERATIVE REPORT   PREOPERATIVE DIAGNOSIS:  Subcutaneous abscesses right lower extremity  surgery.   POSTOPERATIVE DIAGNOSIS:  Subcutaneous abscesses right lower extremity.   SURGICAL PROCEDURE:  Incision, drainage and debridement of subcutaneous  abscesses right lower extremity.   SURGEON:  B. Hoxworth, M.D.   ANESTHESIA:  General.   BRIEF HISTORY:  Mr. James Roberson is a 65 year old male admitted September 03, 2008 with very severe cellulitis of the right lower extremity.  He is  diabetic.  His cellulitis has gradually improved over the last couple of  weeks, but diffuse severe cellulitis has coalesced into several  subcutaneous pockets of purulent material which required incision and  drainage.  He continues to improve overall, but now has persistent  tenderness, redness and fluctuance of the posterior mid calf with a  smaller area over the mid tibia as well.  I have recommended incision  and drainage in the operating room.  He is brought to the OR for this  procedure.   DESCRIPTION OF OPERATION:  The patient was brought to the operating room  and placed supine position on the operating table and general  orotracheal anesthesia was induced.  He was carefully positioned and  padded in a frog-leg position so that the circumference of the right  extremity could be prepped from the knee to the ankle and sterilely  draped.  Correct patient and procedure were verified.  He was already on  broad-spectrum antibiotics.  I made a longitudinal incision over the  larger pocket in the mid calf and a large discrete pocket of thick  purulent material and necrotic  subcutaneous tissue was entered.  This  was all suctioned.  All pockets mechanically broken up and debrided.  This all was in the subcutaneous tissue from apparent subcu necrosis  with viable skin and underlying muscle.  Again, all loculations were  broken up and the cavity ended up being about 10 cm in length x 5-6 cm  in width over the posterior calf.  This was done to about a 4-5 cm  incision.  The wound was thoroughly irrigated and packed with 1-inch  Iodoform gauze.  There was a smaller area directly anterior over the  shin as well that was incised and about a 4 cm pocket of similar  material entered, cultured, drained, debrided, irrigated and packed as  well.  The other wounds on his leg appeared very clean and there was no  other evidence of abscesses.  The wounds were dressed with 4x4 and  Kerlix and he was taken to the recovery room in good condition.      Lorne Skeens. Hoxworth, M.D.  Electronically Signed     BTH/MEDQ  D:  09/16/2008  T:  09/16/2008  Job:  [id number]

## 2010-12-21 NOTE — Op Note (Signed)
NAME:  NEWEL, OIEN NO.:  [id number]   MEDICAL RECORD NO.:  [id number]          PATIENT TYPE:  INP   LOCATION:  5157                         FACILITY:  [facility]   PHYSICIAN:  Alfonse Ras, MD   DATE OF BIRTH:  1945-11-16   DATE OF PROCEDURE:  11/05/2007  DATE OF DISCHARGE:                               OPERATIVE REPORT   PREOPERATIVE DIAGNOSES:  Soft tissue infection, diabetic ulcer of the  right lower extremity.   POSTOPERATIVE DIAGNOSES:  Soft tissue infection, diabetic ulcer of the  right lower extremity.   PROCEDURE:  Incision and drainage and debridement of skin and  subcutaneous tissue and fascia.   ANESTHESIA:  General laryngeal mask.   SURGEON:  Alfonse Ras, M.D.   DESCRIPTION:  The patient was taken to the operating room, placed in a  supine position.  After adequate general anesthesia was induced using  laryngeal mask, the right lower extremity was prepped and draped in a  normal sterile fashion.  Using a circular incision around the previous  ulcer, dead subcutaneous tissue and fascia were debrided with blunt  dissection and vigorously pulse lavaged.  Tissue cultures were sent.  This was packed with Betadine-soaked Kerlix and wrapped with a Kerlix  wrap.  The patient tolerated the procedure well and went to PACU in good  condition.      Alfonse Ras, MD  Electronically Signed     KRE/MEDQ  D:  11/05/2007  T:  11/06/2007  Job:  [id number]

## 2010-12-21 NOTE — Discharge Summary (Signed)
NAMEBOWMAN, HIGBIE NO.:  [id number]   MEDICAL RECORD NO.:  [id number]          PATIENT TYPE:  INP   LOCATION:  1509                         FACILITY:  [facility]   PHYSICIAN:  Charlestine Massed, MDDATE OF BIRTH:  08-02-1946   DATE OF ADMISSION:  09/03/2008  DATE OF DISCHARGE:  09/18/2008                               DISCHARGE SUMMARY   PRIMARY CARE PHYSICIAN:  1. Dr. Juleen China.  2. Infectious disease specialist, Dr. Orvan Falconer.  3. Surgeon, Dr. Johna Sheriff.   REASON FOR ADMISSION:  Right lower extremity cellulitis.   HOSPITAL COURSE:  Mr. Charlis Harner is pleasant 65 year old Caucasian  male who has prior history of type 2 diabetes, psoriasis and history of  MRSA cellulitis of right ankle in April of 2009, who came with rapidly  progressive right lower extremity cellulitis over the past 24 hours.  The patient noticed he had a small blister that was draining on the  right calf also, and leg was erythematous and edematous.  The patient  was on cephalexin at home as per advice of his PMD, but the infection  did not respond to that.  He was having high fevers of 103.   The patient was diagnosed with diagnosed with cellulitis/fasciitis  secondary to infection, and he was started on vancomycin and broad-  spectrum antibiotic coverage, and MRI was also ordered.  The patient had  a surgical consult.   Surgery did a debridement of the right lower extremity on January 31 and  was found to have fluid collection inside and a small opening in the  medial malleolus, and debridement was done.  The patient had repeated  debridement done on February 2 and on February 9 after which the patient  was being seen by Dr. Johna Sheriff of surgery, and regular dressing has been  advised.   So far, all the blood cultures, urine cultures and wound cultures taken  during surgery have reportedly shown no growth.  The patient was started  on antibiotics right away, but the cultures are all still  out so far.  The patient has been continued on vancomycin and has completed a 14-day  course of vancomycin so far and has completed a 7-day course of  Augmentin also so far.   Infectious disease specialist, Dr. Orvan Falconer, had been called for consult  from the beginning, and he has been following this case.  Currently, the  patient's antibiotic coverage was put on as per his advice.   The patient has been seen by surgery today and has been advised for  discharge to continue home care with wound dressing daily, and Dr.  Orvan Falconer was consulted about discharge medications, and Dr. Orvan Falconer  concluded that there is no need for any further outpatient medication as  the patient has completed a full course of antibiotics here in the  hospital 14 days.  The patient is being discharged with previous  medications as he was taking before and with the new medications for  diabetes started during this admission.   DISCHARGE DIAGNOSES:  1. Right lower extremity cellulitis.  All cultures have been negative  so far.  2. Diabetes.  3. Hypertension.  4. History of psoriasis, gout and dyslipidemia.   DISCHARGE MEDICATIONS:  New medications on discharge are:  1. Percocet 5/325 two tablets p.o. q.6 h. p.r.n. for pain.  2. Lantus insulin 25 units at bedtime subcutaneously.  3. NovoLog insulin coverage q.a.c. every night sensitive scale dosing.  4. Dilantin 160 mg p.o. daily.   The previous medication Diovan/hydrochlorothiazide combination was  stopped.   The existing medications to be continued are:  1. Allopurinol 100 mg daily.  2. Metformin 500 mg b.i.d.  3. Ambien 10 mg p.o. every night.  4. Fish oil 1200 mg p.o. daily.  5. Vitamin D 1000 mg daily.  6. Januvia 100 mg daily.  7. Lipitor 40 mg daily.  8. Soriatane 25 mg daily.   The medications to be stopped are Aleve, Keflex,  Diovan/hydrochlorothiazide combination and Vicodin.   DISCHARGE INSTRUCTIONS:  1. Fall precautions to be  observed, walk with assistance, use crutches      for walking.  2. Daily dressing as mentioned by surgery with saline gauze by home      care.  Home care has been set up.  3. Proper insulin dosing and blood sugar control.   FOLLOW UP:  1. Follow up with Dr. Johna Sheriff within 2 weeks; call [phone] for      appointment.  2. Follow up with Dr. Orvan Falconer on February 18; appointment will be      been given to the patient.  3. Please see the primary doctor, Dr. Juleen China, in 3 weeks.   DISPOSITION:  Being discharged back home with home care.   A total of spent on the discharge, home care setup and  education the patient.      Charlestine Massed, MD  Electronically Signed     UT/MEDQ  D:  09/18/2008  T:  09/18/2008  Job:  [id number]   cc:   [name], M.D.  Fax: [phone]   Cliffton Asters, M.D.  Fax: [phone]   Lorne Skeens. Hoxworth, M.D.  [address], [address]  [place]  [place] [address]

## 2010-12-21 NOTE — Discharge Summary (Signed)
NAMEYISHAI, REHFELD NO.:  [id number]   MEDICAL RECORD NO.:  [id number]          PATIENT TYPE:  INP   LOCATION:  5157                         FACILITY:  [facility]   PHYSICIAN:  Lonia Blood, M.D.DATE OF BIRTH:  03/10/1946   DATE OF ADMISSION:  10/29/2007  DATE OF DISCHARGE:  11/15/2007                               DISCHARGE SUMMARY   PRIMARY CARE PHYSICIAN:  [name], M.D.   DISCHARGE DIAGNOSES:  1. Severe right lower extremity diabetic cellulitis with abscess.      a.     Status post incision and drainage with debridement.      b.     Methicillin susceptible staph aureus by culture.      c.     Much improved on IV antibiotics.      d.     Outpatient wound care arranged.      e.     Normal ABIs confirmed during hospital stay  2. Diabetes mellitus.  3. Hyperlipidemia.  4. Hypertension.  5. Gout.  6. Psoriasis.  7. Status post lumbar laminectomy, L3-L4, with diskectomy.   DISCHARGE MEDICATIONS:  1. Keflex 500 mg p.o. q.i.d. through November 17, 2007; then discontinue.  2. MS Contin 60 mg p.o. b.i.d. - #40 given.  3. Morphine sulfate 15 mg one to two p.o. q.3h. p.r.n. pain - #60      given.  4. Soriatane 25 mg p.o. daily.  5. Dovonex cream applied twice daily.  6. Lipitor 40 mg q.h.s.  7. Amaryl 1 mg p.o. daily.  8. Metformin 500 mg p.o. b.i.d.  9. Januvia 100 mg p.o. daily.  10.Allopurinol 100 mg p.o. daily.  11.Diovan 320/25 daily.  12.Ambien 10 mg p.o. q.h.s. p.r.n.  13.Vitamin D 1000 mg p.o. daily.  14.Fish oil 1200 mg p.o. daily.   FOLLOW UP:  1. Patient is advised to call Dr. Michaelle Birks to arrange for follow-      up appointment at [phone].  He should be seen within 7-10 days for      reevaluation of his foot wound.  2. The patient is advised to follow up with Dr. Juleen China within 7-10 days      for recheck of his diabetes and to assure that he is progressing      well from the standpoint of his other chronic medical problems.   WOUND  CARE:  The patient is to continue wet-to-dry dressings to his  right foot wound on a b.i.d. basis.  The patient is also scheduled for  daily pulsatile lavage at an outpatient facility.  This wound care is to  continue until the patient is instructed otherwise by Dr. Colin Benton.  The  patient's lower extremity should be wrapped with an Ace wrap, snug from  toes up to his knee.   CONSULTATIONS:  1. General surgery - Dr. Baruch Merl.  2. Infectious disease.   PROCEDURES:  1. Incision and drainage with debridement of skin and subcutaneous      tissue and fascia, November 05, 2007, of the right foot.  2. Bilateral lower extremity ABIs accomplished on November 12, 2007 - 1.2      bilaterally.  3. MRI of the right foot, November 04, 2007 - progression of inflammatory      process within the distal right ankle.  An ulceration is identified      with an underlying subcutaneous abscess measuring 3.0 cm superior      to inferior.  Phlegmonous inflammatory change tract cephalad.  Deep      abscess inflammation of the flexor hallicis muscle which is      reflective of underlying myositis.  No osseous involvement      appreciated.  4. MRI of the right extremity on October 30, 2007 - soft tissue edema      consistent with cellulitis.  No abscess or osteomyelitis.   HOSPITAL COURSE:  Mr. Megan Hayduk is a pleasant 65 year old gentleman  with a complex medical history as detailed above.  He presented to the  hospital on the date of admission with complaints of severe right lower  extremity swelling and erythema.  The patient had been undergoing  outpatient treatment with Augmentin for cellulitis but this clearly had  failed.  The patient was admitted to the acute units.  MRI revealed  significant severe cellulitis but initially did not suggest an actual  abscess.  The patient was placed on empiric IV antibiotics.  He failed  to improve.  As a result, repeat MRI was carried out.  This time, there  was evidence  to suggest an abscess as noted above.  The patient was  taken to the OR.  Incision and drainage and debridement was carried out.  The patient tolerated the procedure well.  Postoperative wound care was  directed by the general surgery service.  ID was consulted to assure  that the patient was on appropriate antibiotic therapy.  Wound cultures  in fact did return positive for methicillin-susceptible Staphylococcus  aureus.  The patient's antibiotics were narrowed accordingly.  The  patient tolerated IV Ancef without difficulty.  He continued to improve  clinically.  Arrangements were made for outpatient wound care.  ABIs  were carried out for completeness sake to assure that there was adequate  blood flow to the extremities.  This was felt to be the case, as the  patient was in fact already healing nicely.  ABIs confirmed appropriate  blood flow to both extremities.   On November 12, 2007, the patient was cleared for discharge home.  Wound  care and antibiotic therapy is as noted above.      Lonia Blood, M.D.  Electronically Signed     JTM/MEDQ  D:  11/12/2007  T:  11/12/2007  Job:  [id number]   cc:   Alfonse Ras, MD  [name], M.D.

## 2010-12-21 NOTE — H&P (Signed)
NAMEMarland Kitchen  TIMOUTHY, GILARDI NO.:  [id number]   MEDICAL RECORD NO.:  [id number]          PATIENT TYPE:  EMS   LOCATION:  ED                           FACILITY:  [facility]   PHYSICIAN:  Massie Maroon, MD        DATE OF BIRTH:  15-Oct-1945   DATE OF ADMISSION:  03/09/2009  DATE OF DISCHARGE:                              HISTORY & PHYSICAL   CHIEF COMPLAINT:  Right lower extremity cellulitis.   HISTORY OF PRESENT ILLNESS:  A 65 year old male with diabetes,  psoriasis, history of MRSA cellulitis of the right ankle in April of  2009 presents with complaints of redness of the right distal lower  extremity spreading upwards towards the knee and to the inner thigh of  his right leg over the past day.  He has had fevers.  He has noted some  small blisters on the right distal lower extremity.  There is warmth and  tenderness of his leg.  Patient has previously been diagnosed with  cellulitis/fasciitis secondary to infection and was started on  vancomycin and broad-spectrum antibiotic coverage in the past.  Surgery  apparently did debridement of the right lower extremity on January 31  and found a fluid collection inside a small opening in the medial  malleolus and debridement was done.  The patient had repeated  debridement on February 2 and February 9 after which the patient was  seen by Dr. Johna Sheriff of Surgery and regular dressings have been advised.  Blood cultures, urine cultures and wound cultures reportedly showed no  growth.  The patient was brought to the ED for cellulitis.  The patient  will be admitted for cellulitis, possibly MRSA infection.   PAST MEDICAL HISTORY:  1. Hypertension.  2. Hyperlipidemia.  3. Diabetes.  4. Gout.  5. Right lower extremity cellulitis, soft tissue abscess in the right      ankle status post I and D on January 31, February 2, February 9.  6. History of soft tissue infection, diabetic ulcer of the right      distal lower extremity with incision,  drainage and debridement of      the skin and subcutaneous tissue and fascia on November 05, 2007.  7. Removal of syntheses small stature cervical plate from Z6-X0,      exploration of C6-C7 arthrodesis and C3-C4 and C4-C5 anterior      cervical diskectomy and arthrodesis with iliac crest allograft      small stature cervical plating by Dr. Shirlean Kelly for C3-C4, C4-      C5 cervical spondylosis and degenerative disk disease with      myelopathy.  8. Lumbar laminectomy L3-L4 and diskectomy.  9. Psoriasis   SOCIAL HISTORY:  The patient lives with his spouse.  He occasionally  drinks.  He is a nonsmoker.  He denies drug abuse.   FAMILY HISTORY:  No history of MRSA disease.   ALLERGIES:  AVELOX CAUSES ANAPHYLAXIS, AMBIEN CAUSES HALLUCINATIONS,  PLASTIC TAPE.   MEDICATIONS:  1. Allopurinol 100 mg p.o. daily.  2. Fish oil 1200 mg p.o. daily.  3.  Vitamin B1 one thousand international units daily.  4. Januvia 100 mg p.o. daily.  5. Lipitor 40 mg p.o. daily (pravastatin 80 mg p.o. nightly).  6. Soriatane 25 mg p.o. daily.  7. Metformin 1000 mg p.o. b.i.d.  8. Diovan 80 mg p.o. daily.  9. Naproxen 500 mg p.o. b.i.d.   REVIEW OF SYSTEMS:  Negative for all 10 organ systems except for  pertinent positives stated above.   PHYSICAL EXAM:  Temperature 97.4, pulse 96, blood pressure 143/88,  respiratory rate 20, pulse ox 97% on room air.  HEENT:  Anicteric, EOMI, no nystagmus, pupils 1.5 mm, symmetric, direct,  consensual, near reflexes intact.  Mucous membranes moist.  NECK:  No JVD, no bruit, no thyromegaly, no adenopathy.  HEART:  Regular rate and rhythm.  S1, S2.  No murmurs, gallops or rubs.  LUNGS:  Clear to auscultation bilaterally.  ABDOMEN:  Soft, nontender, nondistended.  Positive bowel sounds.  EXTREMITIES:  No cyanosis, clubbing, DP pulses 2+ bilaterally.  SKIN:  Erythema spreading from the right ankle to the right knee and  then also in the right inner thigh.  There is warmth  associated with the  erythema.  There are some subcutaneous pustules in the right distal  lower extremity on the back of the left distal lower extremity.  There  is a 0.5 cm opening near the right medial malleolus which is packed with  gauze.  There is no evidence of inguinal adenopathy.  Negative Homans'  sign.  NEURO EXAM:  Nonfocal, cranial nerves II-XII intact, reflexes 2+,  symmetric, diffuse with downgoing toes bilaterally, motor strength 5/5  in all four extremities, pinprick intact.   X-ray of the ankle shows soft tissue swelling, no fracture dislocation.  X-ray of the right tib-fib shows no bony abnormalities to the right  tibia or fibula.   LABORATORIES:  Dilantin less than 2.5.  Sodium 136, potassium 3.7,  chloride 100, bicarb 24, BUN 34, creatinine 1.39.  WBC 7.0, hemoglobin 14.2, platelet count 244.   ASSESSMENT/PLAN:  1. Cellulitis, presumed methicillin-resistant Staphylococcus aureus:      We will obtain a wound culture from the right medial malleolus as      well as blood cultures x2 sets.  We will check an ESR and a CRP.      The patient was started on vancomycin, pharmacy to dose IV as well      as Zosyn 4.5 grams IV every 8 hours.  I wonder if he may have some      prepatellar bursitis as well as his right knee.  We will check an      ultrasound to rule out deep vein thrombosis though I think this is      doubtful.  Swelling is most likely secondary to cellulitis.  2. Diabetes:  This patient will be continued on Januvia as well as      metformin.  We will check fingerstick blood sugars in the morning      and at bedtime and use insulin sliding scale.  3. Hyperlipidemia:  Continue pravastatin 80 mg nightly.  4. Hypertension:  Diovan 80 mg p.o. daily.  5. Gout:  Continue allopurinol 100 mg p.o. daily.  6. Psoriasis:  Continue Soriatane 25 mg p.o. daily.  7. Deep vein thrombosis prophylaxis:  Lovenox 40 mg subcu daily.      Massie Maroon, MD  Electronically  Signed     JYK/MEDQ  D:  03/09/2009  T:  03/09/2009  Job:  [id number]   cc:   [name], M.D.  Fax: [phone]   Cliffton Asters, M.D.  Fax: [phone]   Lorne Skeens. Hoxworth, M.D.  [address], [address]  [place]  [place] [address]

## 2010-12-21 NOTE — Discharge Summary (Signed)
NAMEMICKIE, KOZIKOWSKI NO.:  [id number]   MEDICAL RECORD NO.:  [id number]          PATIENT TYPE:  INP   LOCATION:  1304                         FACILITY:  [facility]   PHYSICIAN:  Hillery Aldo, M.D.   DATE OF BIRTH:  [birth year]   DATE OF ADMISSION:  03/09/2009  DATE OF DISCHARGE:  03/13/2009                               DISCHARGE SUMMARY   ADDENDUM:   PRIMARY CARE PHYSICIAN:  [name], M.D.   DERMATOLOGIST:  Ria Bush. Jorja Loa, M.D.   The patient was noted to have an Avelox allergy and therefore he was  discharged on clindamycin 300 mg p.o. q.6 h x2 weeks instead of Avelox.  The patient was instructed to contact his primary care physician and  discontinue clindamycin if he should develop any severe diarrhea.      Hillery Aldo, M.D.  Electronically Signed     CR/MEDQ  D:  03/13/2009  T:  03/13/2009  Job:  [id number]   cc:   [name], M.D.  Fax: [phone]   Ria Bush. Jorja Loa, M.D.  Fax: [phone]

## 2010-12-21 NOTE — H&P (Signed)
NAME:  James Roberson, James Roberson              ACCOUNT NO.:  [id number]   MEDICAL RECORD NO.:  [id number]          PATIENT TYPE:  INP   LOCATION:  1823                         FACILITY:  [facility]   PHYSICIAN:  Hind I Eda Paschal, MD      DATE OF BIRTH:  09-28-1945   DATE OF ADMISSION:  10/29/2007  DATE OF DISCHARGE:                              HISTORY & PHYSICAL   PRIMARY CARE PHYSICIAN:  Dr. Darci Needle.   CHIEF COMPLAINT:  Right lower extremity swelling.   HISTORY OF PRESENT ILLNESS:  This is a 65 year old male with a history  of diabetes mellitus on oral hypoglycemic agents admitted today with  right lower extremity swelling and redness which started on Saturday as  a small red area around the size of a silver coin.  He woke up today and  found the swelling was progressively worse.  The patient had to go to  his primary care where he recommended the patient come to the emergency  room for evaluation of severe cellulitis and failed outpatient  treatment.  The patient was treated with Augmentin p.o. for 3 days.   The patient denies any fever, denies any shortness of breath, denies any  nausea or vomiting, denies any numbness or weakness of his extremities.   ALLERGIES:  AVELOX.   MEDICATIONS:  1. Augmentin 875 one tab twice daily for infection.  2. Soriatane 25 mg one tab daily.  3. Dovonex cream apply twice daily.  4. Lipitor 40 mg p.o. nightly.  5. Amaryl 1 mg daily.  6. Metformin 500 mg p.o. b.i.d.  7. Januvia 100 mg daily.  8. Allopurinol 100 mg.  9. Diovan 320/25 one tab daily for blood pressure.  10.Hydrocodone/APAP 5/500 one to two tabs as needed for pain.  11.Ambien 10 mg p.o. as needed.  12.Vitamin D 1000 mg p.o. daily.  13.Fish oil 1200 mg p.o. daily.   PAST MEDICAL HISTORY:  1. Diabetes mellitus.  2. Hyperlipidemia.  3. Hypertension.  4. Gout.  5. History of  psoriasis.  6. Status post lumbar laminectomy of L3-L4 and diskectomy.   FAMILY HISTORY:  Positive for diabetes,  hypertension.   SOCIAL HISTORY:  He is married.  He lives in [place].  He denies any  smoking.  He drinks alcohol occasionally.  He denies any IV drug abuse.  He has 3 children, all of them are healthy.   PHYSICAL EXAMINATION:  VITAL SIGNS:  Temperature 97.7, blood pressure  155/81, pulse rate 113, respiratory rate 20, saturation 97% on room air.  GENERAL:  The patient is alert, oriented, very pleasant gentleman.  HEENT:  Normocephalic atraumatic.  Pupils equal, reactive to light and  accommodation.  Extraocular muscle movements within normal.  NECK:  Supple.  No JVD.  No lymphadenopathy.  HEART:  S1 and S2.  No other sound.  ABDOMEN:  Soft, nontender.  Bowel sounds positive.  EXTREMITIES:  Peripheral pulses intact bilaterally.  There is some right  medial and lateral malleolus erythema, tender to palpation, warm,  extending from the above the toes to the mid thigh.  There are some  pustules around  the medial malleolus.  Peripheral pulses intact and  there is no evidence of cyanosis.  NEUROLOGIC:  The patient alert and oriented x3 with no focal  neurological findings.   LABORATORY:  White blood cells 15.6, hemoglobin 14.4, hematocrit 42.8,  platelets 285.  Sodium 136, potassium 3.18, chloride 99, carbon dioxide  26, glucose 117, BUN 21, creatinine 1.31, calcium 9.6.  Total protein  7.6, albumin 3.8.  X-ray of the right ankle with no acute bony  pathology.   ASSESSMENT:  1. Right lower extremity cellulitis.  2. Diabetes mellitus.  3. Hypertension.  4. Hyperlipidemia.  5. Mild tachycardia.   PLAN:  1. We will admit the patient for IV antibiotics, mainly vancomycin and      Zosyn.  2. We will get an MRI of the lower extremity to rule out osteomyelitis      or gas gangrene.  3. We will follow the patient's hospital course.  4. We will get hemoglobin A1c for evaluation of his diabetes.  5. Meanwhile, we will continue with his home diabetic medication in      addition to  sliding scale coverage.  6. Further recommendations to be addressed as hospital course.      Hind Bosie Helper, MD  Electronically Signed     HIE/MEDQ  D:  10/29/2007  T:  10/29/2007  Job:  [id number]

## 2010-12-21 NOTE — H&P (Signed)
NAME:  KEITON, COSMA NO.:  [id number]   MEDICAL RECORD NO.:  [id number]          PATIENT TYPE:  EMS   LOCATION:  ED                           FACILITY:  [facility]   PHYSICIAN:  Peggye Pitt, M.D. DATE OF BIRTH:  1946-01-09   DATE OF ADMISSION:  09/03/2008  DATE OF DISCHARGE:                              HISTORY & PHYSICAL   PRIMARY CARE PHYSICIAN:  [name], M.D.   CHIEF COMPLAINT:  Right lower extremity cellulitis.   HISTORY OF PRESENT ILLNESS:  Mr. Kantner is a 65 year old Caucasian man  with a history of diabetes mellitus and psoriasis.  Also a history of  MSSA right lower extremity cellulitis in March of 2009.  He presents to  the hospital with a 24-hour history of rapidly progressive right lower  extremity edema and erythema.   Dictation ended at this point.      Peggye Pitt, M.D.     EH/MEDQ  D:  09/03/2008  T:  09/03/2008  Job:  [id number]

## 2010-12-21 NOTE — Op Note (Signed)
James Roberson, James Roberson NO.:  [id number]   MEDICAL RECORD NO.:  [id number]          PATIENT TYPE:  INP   LOCATION:  1509                         FACILITY:  [facility]   PHYSICIAN:  Sharlet Salina T. Hoxworth, M.D.DATE OF BIRTH:  Feb 26, 1946   DATE OF PROCEDURE:  09/09/2008  DATE OF DISCHARGE:                               OPERATIVE REPORT   PREOPERATIVE DIAGNOSES:  Cellulitis and abscess right lower extremity.   POSTOPERATIVE DIAGNOSES:  Cellulitis and abscess right lower extremity.   SURGICAL PROCEDURES:  Incision and drainage, debridement right lower  extremity.   SURGEON:  Lorne Skeens. Hoxworth, M.D.   ANESTHESIA:  General.   BRIEF HISTORY:  This is a 65 year old diabetic male with venous stasis  disease and history of cellulitis of the right lower extremity.  He was  admitted with severe cellulitis of the right lower extremity on 01/27.  He initially had only a small superficial abscess over the lateral  malleolus apparent for drainage.  He initially improved on antibiotics.  Four days ago he developed an area of fluctuance over the medial  malleolus which was incised and drained by Dr. Derrell Lolling with initially  some improvement but now he has failed to resolve significant cellulitis  from the knee down and increasing white count.  MR today shows a  definite fluid collection about 5 cm consistent with abscess more  proximally on the medial right lower extremity.  I have recommended  general anesthesia incision and drainage.  He is brought to the  operating room for this procedure.  Ashby Dawes, risks of bleeding,  anesthetic complications discussed and understood.   DESCRIPTION OF OPERATION:  The patient brought to the operating room,  placed in supine position on the operating table.  General anesthesia  was induced.  The right lower extremity was widely sterilely prepped and  draped.  He was already on broad-spectrum antibiotics.  Directed  examination based on the MR did  reveal an area of bogginess and  fluctuance about 6 or 7 cm above the medial malleolus, low anterior.  I  incised into this and there was thick purulent material and about a 6 x  4 cm cavity.  This was cultured.  There was also necrotic subcutaneous  tissue that was debrided.  All loculations were broken up and I  attempted to explore back in all directions from the cavity and there  did not appear to be any further tracking or necrotic tissue from this  area.  Hemostasis obtained with cautery and the wound was thoroughly  irrigated with saline.  The previous I and D site over the medial  malleolus was then carefully explored and although it looked fine, with  pressure down over the dorsum the foot and inferior to the I and D site  there was thick purulent material that was expressed into the wound.  This was also true from just above the wound.  I then therefore extended  this incision inferiorly down onto the dorsum of foot and superiorly up  toward the I and D site I had just performed and found further necrotic  subcutaneous  tissue and some purulence which was cultured and then also  completely debrided and irrigated and I could not find any further  tracking after this point.  Also on the original superficial I and D  site over the lateral malleolus there was a small amount of  necrotic subcu at the base of the wound that was debrided.  This did not  seem to track anywhere.  Hemostasis obtained with cautery in the wounds.  Again they were all packed with moist saline gauze and the wound dressed  with a bulky 4 x 4 and Kerlix dressing.  He was taken to recovery in  good condition.      Lorne Skeens. Hoxworth, M.D.  Electronically Signed     BTH/MEDQ  D:  09/09/2008  T:  09/10/2008  Job:  [id number]

## 2010-12-21 NOTE — Group Therapy Note (Signed)
NAMEMarland Kitchen  James Roberson, James Roberson NO.:  [id number]   MEDICAL RECORD NO.:  [id number]          PATIENT TYPE:  INP   LOCATION:  1509                         FACILITY:  [facility]   PHYSICIAN:  Richarda Overlie, MD       DATE OF BIRTH:  12/31/1945                                 PROGRESS NOTE   Mr. James Roberson was assessed by Lorne Skeens. Hoxworth, M.D. this  morning and has been scheduled to go down to the OR for another area  that needs to be debrided.   PHYSICAL EXAMINATION:  VITAL SIGNS:  Blood pressure 99/61, pulse of 96, respirations 20, 96% on  room air.  HEENT:  Pupils equal and reactive.  Extraocular movements intact.  LUNGS:  Clear to auscultation.  CARDIOVASCULAR:  Regular rate and rhythm.  ABDOMEN:  Soft, nontender, nondistended.  RIGHT LOWER EXTREMITY:  Still has an area of increased erythema and  tenderness on the posteromedial aspect of his right calf.  Distal pulses 2+ bilaterally.   LABS:  Sodium 130, potassium 4, chloride 96, bicarb 27, glucose 162, creatinine  1.28, calcium 8.7.  Vancomycin trough 30.2.  WBC 7.7, hemoglobin 11.2,  hematocrit 34 and platelet count of 439.   ASSESSMENT/PLAN:  1. Right lower extremity cellulitis.  Discussed with Dr. Orvan Falconer      today at pager # [phone] to see if the patient's antibiotic      coverage needs to be changed at this point.  As far as his      assessment no change in his antibiotic coverage as needed.  We      appreciate Dr. Blair Dolphin and Dr. Jamse Mead input.  The patient      will continue to need intravenous antibiotics at this point.  2. Mild hypotension.  We will hold Benicar, hydrochlorothiazide for      now.  3. Diabetes.  Stable at this time.  4. Hyponatremia.  Discontinue hydrochlorothiazide.   DISPOSITION:  Continue inpatient care at this time.  The patient not stable for  discharge.      Richarda Overlie, MD  Electronically Signed     NA/MEDQ  D:  09/16/2008  T:  09/16/2008  Job:  [id number]

## 2010-12-21 NOTE — H&P (Signed)
NAME:  James Roberson, MINUS NO.:  [id number]   MEDICAL RECORD NO.:  [id number]          PATIENT TYPE:  EMS   LOCATION:  ED                           FACILITY:  [facility]   PHYSICIAN:  Peggye Pitt, M.D. DATE OF BIRTH:  08/22/1945   DATE OF ADMISSION:  09/03/2008  DATE OF DISCHARGE:                              HISTORY & PHYSICAL   PRIMARY CARE PHYSICIAN:  [name], M.D.   CHIEF COMPLAINT:  Right lower extremity cellulitis.   HISTORY OF PRESENT ILLNESS:  Mr. Leeds is a pleasant 65 year old  Caucasian man with a history of type 2 diabetes mellitus, psoriasis, and  a history of methicillin-sensitive Staphylococcus aureus cellulitis to  his right ankle back in April of 2009.  He presents with a rapidly  progressive right lower extremity cellulitis over the past 24 hours.  On  Monday night, which is 2 days prior to admission, he noticed a small  blister that was draining some serosanguineous material over the inner  aspect of his right calf.  When he woke up the next day, the cellulitis  had progressed to his knee.  His leg was very erythematous and  edematous.  He also had 3 draining blisters with fluid that was  purulent.  He went to his primary care physician, Dr. Juleen China, who  prescribed him cephalexin, which he has taken all 5 doses.  Today he  noticed that the erythema had spread to his groin area and he had some  lymph nodes.  He became concerned and came into the hospital to seek  further evaluation and management.  For this reason, he is brought into  the hospital.  He also describes fevers up to 102.3 and chills the day  prior to admission.  Other than that, no other symptoms.   ALLERGIES:  HE HAS ALLERGIES TO AVELOX.  THIS IS AN ANAPHYLACTIC  REACTION.   PAST MEDICAL HISTORY:  Significant for:  1. Type 2 diabetes.  2. Hypertension.  3. Hyperlipidemia.  4. Gout.  5. Psoriasis.  6. A history of right lower extremity Methicillin-sensitive  Staphylococcus aureus cellulitis in April of 2009.   HOME MEDICATIONS:  1. Allopurinol 100 mg daily.  2. Metformin 500 mg twice daily.  3. Ambien 10 mg q.h.s. p.r.n.  4. Keflex 500 mg 4 times a day.  5. Diovan/HCTZ 320/25 mg daily.  6. Fish oil 1200 mg daily.  7. Vitamin B 1000 mg daily.  8. Januvia 1000 mg daily.  9. Lipitor 40 mg daily.  10.Vicodin 5/500 mg 2 tablets as needed for pain.  11.Soriatane 25 mg daily.   SOCIAL HISTORY:  Denies any illicit drug use or smoking.  He does state  that he drinks about 1-2 glasses of wine per week.  He is married.  He  has 2 children.   FAMILY HISTORY:  Noncontributory.   REVIEW OF SYSTEMS:  Negative except as already mentioned in the HPI.   PHYSICAL EXAM:  VITAL SIGNS:  Upon admission blood pressure 134/84,  heart rate 114, respirations 20, 02 saturation 96% on room air with a  temp of 97.5.  GENERAL:  He is alert, awake, oriented x3.  Not in acute distress.  HEENT:  Normocephalic, atraumatic.  His pupils are equally reactive to  light and accommodation.  His neck is supple.  No JVD.  No lymphadenopathy.  No bruits.  No  goiter.  His heart is tachycardic but with a regular rhythm.  No murmurs, rubs,  or gallops.  His lungs are clear to auscultation bilaterally.  His abdomen is soft, nontender, nondistended with positive bowel sounds.  His neurologic exam is grossly intact and nonfocal.  His extremities, his left he has no edema, positive pulses.  On his  right lower extremity he has about 3+ pitting edema up to his thigh.  He  has erythema that progresses from the dorsum of his foot all the way up  to his knee.  He also has some erythema around the lateral aspect of his  thigh right above his knee.  Also, the inner aspect of his thigh right  at the groin area with 2 palpable lymph nodes.  The skin of his right  lower extremity appears to be desquamating and he has some areas of  seropurulent drainage.  No onychomycosis.   LAB WORK  ON ADMISSION:  Sodium 133, potassium 3.8, chloride 102, bicarb  19, BUN 29, creatinine 1.18, with a glucose of 182.  WBC 17.4 with an  ANC of 15.7, a hemoglobin of 13.5, and a platelet count of 250.  He had  an x-ray of his right leg that is consistent with some soft tissue  edema.   ASSESSMENT AND PLAN:  1. For his right lower extremity cellulitis, will proceed with      obtaining blood cultures.  Dr. Johna Sheriff with surgery has already      been consulted by the emergency department physician.  He has      opened one of the blisters and has cultured the fluid.  For now,      will continue the antibiotics that were started in the emergency      room, which are vancomycin and cefepime.  Will also check magnetic      resonance imaging to rule out deep infection, an abscess, or      osteomyelitis.  Will also check a right lower extremity Doppler to      make sure that he does not have deep vein thrombosis.  Surgery has      recommended Silvadene wraps twice daily to that extremity.  Will      also obtain a wound care consultation.  2. For his type 2 diabetes, will check an A1c.  Will continue his      Januvia, hold the metformin while in the hospital, and start him on      a moderate sliding-scale insulin.  3. For his hypertension, will continue his home medications.  Blood      pressure is currently well controlled.  4. For his hyperlipidemia, will check a fasting lipid panel and      continue his home dose of statins.  5. For his psoriasis, will continue his Soriatane.  6. For prophylaxis while in the hospital, placed on Protonix for      gastrointestinal prophylaxis and Lovenox for deep vein thrombosis      prophylaxis.      Peggye Pitt, M.D.  Electronically Signed     EH/MEDQ  D:  09/03/2008  T:  09/03/2008  Job:  [id number]   cc:   [name], M.D.  Fax: [phone]

## 2011-05-02 LAB — CBC
HCT: 35 — ABNORMAL LOW
MCHC: 33.6
MCHC: 34.3
MCV: 83.6
MCV: 84.1
Platelets: 254
Platelets: 273
Platelets: 285
Platelets: 395
RDW: 13.1
WBC: 10.1
WBC: 10.6 — ABNORMAL HIGH

## 2011-05-02 LAB — BASIC METABOLIC PANEL
BUN: 11
BUN: 11
BUN: 13
CO2: 24
CO2: 24
Chloride: 100
Chloride: 103
Chloride: 104
Creatinine, Ser: 1.06
GFR calc non Af Amer: >60
Glucose, Bld: 132 — ABNORMAL HIGH
Glucose, Bld: 135 — ABNORMAL HIGH
Potassium: 3.7
Potassium: 3.8

## 2011-05-02 LAB — URINALYSIS, ROUTINE W REFLEX MICROSCOPIC
Nitrite: NEGATIVE
Specific Gravity, Urine: 1.03
Urobilinogen, UA: 0.2
pH: 5.5

## 2011-05-02 LAB — WOUND CULTURE

## 2011-05-02 LAB — URINE DRUGS OF ABUSE SCREEN W ALC, ROUTINE (REF LAB)
Amphetamine Screen, Ur: NEGATIVE
Barbiturate Quant, Ur: NEGATIVE
Benzodiazepines.: NEGATIVE
Ethyl Alcohol: <10
Marijuana Metabolite: NEGATIVE
Opiate Screen, Urine: NEGATIVE
Propoxyphene: NEGATIVE

## 2011-05-02 LAB — PHOSPHORUS: Phosphorus: 3.2

## 2011-05-02 LAB — URINE MICROSCOPIC-ADD ON

## 2011-05-02 LAB — TROPONIN I: Troponin I: 0.02

## 2011-05-02 LAB — URIC ACID: Uric Acid, Serum: 4.9

## 2011-05-02 LAB — PROTIME-INR: Prothrombin Time: 13.9

## 2011-05-02 LAB — DIFFERENTIAL
Basophils Relative: 0
Lymphs Abs: 1.7
Monocytes Absolute: 1.3 — ABNORMAL HIGH
Monocytes Relative: 9
Neutro Abs: 12.5 — ABNORMAL HIGH
Neutrophils Relative %: 80 — ABNORMAL HIGH

## 2011-05-02 LAB — ANAEROBIC CULTURE

## 2011-05-02 LAB — LIPID PANEL
Cholesterol: 110
HDL: 41
Total CHOL/HDL Ratio: 2.7
Triglycerides: 101

## 2011-05-02 LAB — COMPREHENSIVE METABOLIC PANEL
Albumin: 3.8
Alkaline Phosphatase: 71
BUN: 21
Calcium: 9.6
Glucose, Bld: 117 — ABNORMAL HIGH
Potassium: 3.8
Sodium: 136
Total Protein: 7.6

## 2011-05-02 LAB — HEMOGLOBIN A1C
Hgb A1c MFr Bld: 6.6 — ABNORMAL HIGH
Mean Plasma Glucose: 158

## 2011-05-02 LAB — CARDIAC PANEL(CRET KIN+CKTOT+MB+TROPI)
CK, MB: 2
CK, MB: 2.2
Relative Index: INVALID
Total CK: 83
Troponin I: 0.02

## 2011-05-02 LAB — CULTURE, BLOOD (ROUTINE X 2)

## 2011-05-02 LAB — HOMOCYSTEINE: Homocysteine: 8.2

## 2011-05-02 LAB — CALCIUM: Calcium: 9.5

## 2011-05-03 LAB — CBC
HCT: 39.2
Platelets: 428 — ABNORMAL HIGH
RDW: 13.1

## 2011-05-03 LAB — BASIC METABOLIC PANEL
BUN: 14
Calcium: 9.6
GFR calc non Af Amer: >60
Glucose, Bld: 105 — ABNORMAL HIGH

## 2011-05-03 LAB — C-REACTIVE PROTEIN: CRP: 1.6 — ABNORMAL HIGH (ref <0.6)

## 2011-11-10 DIAGNOSIS — L255 Unspecified contact dermatitis due to plants, except food: Secondary | ICD-10-CM | POA: Diagnosis not present

## 2011-11-10 DIAGNOSIS — I10 Essential (primary) hypertension: Secondary | ICD-10-CM | POA: Diagnosis not present

## 2011-11-10 DIAGNOSIS — E119 Type 2 diabetes mellitus without complications: Secondary | ICD-10-CM | POA: Diagnosis not present

## 2011-12-07 DIAGNOSIS — E119 Type 2 diabetes mellitus without complications: Secondary | ICD-10-CM | POA: Diagnosis not present

## 2011-12-07 DIAGNOSIS — Z125 Encounter for screening for malignant neoplasm of prostate: Secondary | ICD-10-CM | POA: Diagnosis not present

## 2011-12-07 DIAGNOSIS — E789 Disorder of lipoprotein metabolism, unspecified: Secondary | ICD-10-CM | POA: Diagnosis not present

## 2011-12-07 DIAGNOSIS — I10 Essential (primary) hypertension: Secondary | ICD-10-CM | POA: Diagnosis not present

## 2011-12-12 DIAGNOSIS — E789 Disorder of lipoprotein metabolism, unspecified: Secondary | ICD-10-CM | POA: Diagnosis not present

## 2011-12-12 DIAGNOSIS — M109 Gout, unspecified: Secondary | ICD-10-CM | POA: Diagnosis not present

## 2011-12-12 DIAGNOSIS — E119 Type 2 diabetes mellitus without complications: Secondary | ICD-10-CM | POA: Diagnosis not present

## 2011-12-12 DIAGNOSIS — I10 Essential (primary) hypertension: Secondary | ICD-10-CM | POA: Diagnosis not present

## 2012-01-25 DIAGNOSIS — L408 Other psoriasis: Secondary | ICD-10-CM | POA: Diagnosis not present

## 2012-02-06 DIAGNOSIS — E1139 Type 2 diabetes mellitus with other diabetic ophthalmic complication: Secondary | ICD-10-CM | POA: Diagnosis not present

## 2012-04-10 DIAGNOSIS — E789 Disorder of lipoprotein metabolism, unspecified: Secondary | ICD-10-CM | POA: Diagnosis not present

## 2012-04-12 DIAGNOSIS — E789 Disorder of lipoprotein metabolism, unspecified: Secondary | ICD-10-CM | POA: Diagnosis not present

## 2012-04-12 DIAGNOSIS — R109 Unspecified abdominal pain: Secondary | ICD-10-CM | POA: Diagnosis not present

## 2012-04-12 DIAGNOSIS — E119 Type 2 diabetes mellitus without complications: Secondary | ICD-10-CM | POA: Diagnosis not present

## 2012-05-24 DIAGNOSIS — Z23 Encounter for immunization: Secondary | ICD-10-CM | POA: Diagnosis not present

## 2012-05-24 DIAGNOSIS — E119 Type 2 diabetes mellitus without complications: Secondary | ICD-10-CM | POA: Diagnosis not present

## 2012-08-16 DIAGNOSIS — E119 Type 2 diabetes mellitus without complications: Secondary | ICD-10-CM | POA: Diagnosis not present

## 2012-08-16 DIAGNOSIS — I10 Essential (primary) hypertension: Secondary | ICD-10-CM | POA: Diagnosis not present

## 2012-08-23 DIAGNOSIS — E119 Type 2 diabetes mellitus without complications: Secondary | ICD-10-CM | POA: Diagnosis not present

## 2012-08-23 DIAGNOSIS — F4321 Adjustment disorder with depressed mood: Secondary | ICD-10-CM | POA: Diagnosis not present

## 2012-09-26 DIAGNOSIS — L408 Other psoriasis: Secondary | ICD-10-CM | POA: Diagnosis not present

## 2012-12-05 DIAGNOSIS — Z125 Encounter for screening for malignant neoplasm of prostate: Secondary | ICD-10-CM | POA: Diagnosis not present

## 2012-12-05 DIAGNOSIS — E789 Disorder of lipoprotein metabolism, unspecified: Secondary | ICD-10-CM | POA: Diagnosis not present

## 2012-12-05 DIAGNOSIS — Z79899 Other long term (current) drug therapy: Secondary | ICD-10-CM | POA: Diagnosis not present

## 2012-12-05 DIAGNOSIS — E119 Type 2 diabetes mellitus without complications: Secondary | ICD-10-CM | POA: Diagnosis not present

## 2012-12-12 DIAGNOSIS — L0291 Cutaneous abscess, unspecified: Secondary | ICD-10-CM | POA: Diagnosis not present

## 2012-12-12 DIAGNOSIS — E789 Disorder of lipoprotein metabolism, unspecified: Secondary | ICD-10-CM | POA: Diagnosis not present

## 2012-12-12 DIAGNOSIS — L0390 Cellulitis, unspecified: Secondary | ICD-10-CM | POA: Diagnosis not present

## 2012-12-12 DIAGNOSIS — E119 Type 2 diabetes mellitus without complications: Secondary | ICD-10-CM | POA: Diagnosis not present

## 2013-01-02 DIAGNOSIS — L408 Other psoriasis: Secondary | ICD-10-CM | POA: Diagnosis not present

## 2013-02-06 DIAGNOSIS — M65849 Other synovitis and tenosynovitis, unspecified hand: Secondary | ICD-10-CM | POA: Diagnosis not present

## 2013-02-06 DIAGNOSIS — M79609 Pain in unspecified limb: Secondary | ICD-10-CM | POA: Diagnosis not present

## 2013-02-06 DIAGNOSIS — E119 Type 2 diabetes mellitus without complications: Secondary | ICD-10-CM | POA: Diagnosis not present

## 2013-02-06 DIAGNOSIS — M7989 Other specified soft tissue disorders: Secondary | ICD-10-CM | POA: Diagnosis not present

## 2013-02-06 DIAGNOSIS — M65839 Other synovitis and tenosynovitis, unspecified forearm: Secondary | ICD-10-CM | POA: Diagnosis not present

## 2013-02-11 DIAGNOSIS — M659 Synovitis and tenosynovitis, unspecified: Secondary | ICD-10-CM | POA: Diagnosis not present

## 2013-04-09 DIAGNOSIS — E789 Disorder of lipoprotein metabolism, unspecified: Secondary | ICD-10-CM | POA: Diagnosis not present

## 2013-04-09 DIAGNOSIS — E119 Type 2 diabetes mellitus without complications: Secondary | ICD-10-CM | POA: Diagnosis not present

## 2013-04-15 DIAGNOSIS — E119 Type 2 diabetes mellitus without complications: Secondary | ICD-10-CM | POA: Diagnosis not present

## 2013-04-15 DIAGNOSIS — E789 Disorder of lipoprotein metabolism, unspecified: Secondary | ICD-10-CM | POA: Diagnosis not present

## 2013-04-15 DIAGNOSIS — L0291 Cutaneous abscess, unspecified: Secondary | ICD-10-CM | POA: Diagnosis not present

## 2013-05-10 DIAGNOSIS — E789 Disorder of lipoprotein metabolism, unspecified: Secondary | ICD-10-CM | POA: Diagnosis not present

## 2013-05-17 DIAGNOSIS — E119 Type 2 diabetes mellitus without complications: Secondary | ICD-10-CM | POA: Diagnosis not present

## 2013-05-17 DIAGNOSIS — E789 Disorder of lipoprotein metabolism, unspecified: Secondary | ICD-10-CM | POA: Diagnosis not present

## 2013-05-17 DIAGNOSIS — L0291 Cutaneous abscess, unspecified: Secondary | ICD-10-CM | POA: Diagnosis not present

## 2013-05-17 DIAGNOSIS — N189 Chronic kidney disease, unspecified: Secondary | ICD-10-CM | POA: Diagnosis not present

## 2013-05-17 DIAGNOSIS — Z23 Encounter for immunization: Secondary | ICD-10-CM | POA: Diagnosis not present

## 2013-05-20 ENCOUNTER — Other Ambulatory Visit: Payer: Self-pay | Admitting: Endocrinology

## 2013-05-20 DIAGNOSIS — N19 Unspecified kidney failure: Secondary | ICD-10-CM

## 2013-05-23 ENCOUNTER — Ambulatory Visit
Admission: RE | Admit: 2013-05-23 | Discharge: 2013-05-23 | Disposition: A | Discharge status: Home / Self Care | Payer: Medicare Other | Source: Ambulatory Visit | Attending: Endocrinology | Admitting: Endocrinology

## 2013-05-23 DIAGNOSIS — N19 Unspecified kidney failure: Secondary | ICD-10-CM

## 2013-05-23 DIAGNOSIS — N179 Acute kidney failure, unspecified: Secondary | ICD-10-CM | POA: Diagnosis not present

## 2013-05-31 DIAGNOSIS — E119 Type 2 diabetes mellitus without complications: Secondary | ICD-10-CM | POA: Diagnosis not present

## 2013-06-03 DIAGNOSIS — L408 Other psoriasis: Secondary | ICD-10-CM | POA: Diagnosis not present

## 2013-06-05 DIAGNOSIS — E789 Disorder of lipoprotein metabolism, unspecified: Secondary | ICD-10-CM | POA: Diagnosis not present

## 2013-06-05 DIAGNOSIS — I10 Essential (primary) hypertension: Secondary | ICD-10-CM | POA: Diagnosis not present

## 2013-06-05 DIAGNOSIS — E119 Type 2 diabetes mellitus without complications: Secondary | ICD-10-CM | POA: Diagnosis not present

## 2013-06-05 DIAGNOSIS — Z23 Encounter for immunization: Secondary | ICD-10-CM | POA: Diagnosis not present

## 2013-07-23 ENCOUNTER — Encounter: Payer: Medicare Other | Attending: Endocrinology | Admitting: *Deleted

## 2013-07-23 ENCOUNTER — Encounter: Payer: Self-pay | Admitting: *Deleted

## 2013-07-23 VITALS — Ht 69.0 in | Wt 204.0 lb

## 2013-07-23 DIAGNOSIS — Z713 Dietary counseling and surveillance: Secondary | ICD-10-CM | POA: Diagnosis not present

## 2013-07-23 DIAGNOSIS — E119 Type 2 diabetes mellitus without complications: Secondary | ICD-10-CM | POA: Diagnosis not present

## 2013-07-23 NOTE — Patient Instructions (Signed)
Plan:  Consider checking BG at alternate times per day   Consider talking to your MD about adding basal insulin to assist with BG control especially in the AM

## 2013-07-23 NOTE — Progress Notes (Signed)
Appt start time: 1000 end time:  1130.  Assessment:  Patient was seen on  07/23/13 for individual diabetes education. He was diagnosed 20 years ago and received diabetes education about 15 years ago. He has an infection on his ankle which can become severe in a matter of hours so he needs to stay near home to be able to get medical care immediately if his ankle flares up. He lives with his wife, their daughter and 4 grandchildren ages 12-16. He is financially responsible for the extended family which is quite stressful for him. Also, his wife has dementia which adds to his stress. He states he has frequent headaches and is unable to sleep unless he takes a sleeping pill. He also states he is unwilling to take insulin.  Current HbA1c: 7.4%  Preferred Learning Style: Auditory  Learning Readiness:   Ready  Change in progress  MEDICATIONS: see list  DIETARY INTAKE:  24-hr recall: for past 4-6 months since he cut out the carbs completely B ( AM): skips  Snk ( AM): none  L (11 AM): regular oatmeal with cream, New Balance oleo, Splenda OR 2-3 eggs or omelet with vegetables and cheese OR tuna with lettuce and tomato, water Snk (3 PM): occasionally a small portion of leftover meat and sometimes vegetables D (6 PM): meat, vegetables, water Snk ( PM): enjoys cookies and chocolate, typically has low carb cracker with almond butter Beverages: water  Usual physical activity: house and yard work  Estimated energy needs: 1600 calories 180 g carbohydrates 120 g protein 44 g fat  Intervention:  Nutrition counseling provided.  Discussed diabetes disease process and treatment options.  Discussed physiology of diabetes and role of obesity on insulin resistance.  Encouraged moderate weight reduction to improve glucose levels.  Discussed role of medications and diet in glucose control  Provided initial education on macronutrients on glucose levels.  Plan to provided education on carb counting,  importance of regularly scheduled meals/snacks, and meal planning at next visit  Discussed effects of physical activity on glucose levels and long-term glucose control.    Reviewed patient medications.  Discussed role of medication on blood glucose and possible side effects. Also discussed insulin action and the benefit of providing a basal insulin to help improve BG control as well as multiple symptoms of hyperglycemia that he is having.  Discussed blood glucose monitoring and interpretation.  Discussed recommended target ranges and individual ranges.    Plan to discuss at next visit:  Described short-term complications: hyper- and hypo-glycemia.  Discussed causes,symptoms, and treatment options.  Discussed prevention, detection, and treatment of long-term complications.  Discussed the role of prolonged elevated glucose levels on body systems.  Discussed role of stress on blood glucose levels and discussed strategies to manage psychosocial issues.  Discussed recommendations for long-term diabetes self-care.  Established checklist for medical, dental, and emotional self-care.  Plan:  Consider checking BG at alternate times per day   Consider talking to your MD about adding basal insulin to assist with BG control especially in the AM   Teaching Method Utilized: Visual and Auditory  Handouts given during visit include: Living Well with Diabetes Carb Counting and Food Label handouts Meal Plan Card  Insulin handout  Barriers to learning/adherence to lifestyle change: financial and stress of living with extended family and wife with early dementia  Diabetes self-care support plan:   [facility] support group  Demonstrated degree of understanding via:  Teach Back   Monitoring/Evaluation:  Dietary intake, exercise, consider  taking insulin to better control BG, and body weight in 6 week(s).

## 2013-08-06 DIAGNOSIS — E119 Type 2 diabetes mellitus without complications: Secondary | ICD-10-CM | POA: Diagnosis not present

## 2013-08-13 DIAGNOSIS — E119 Type 2 diabetes mellitus without complications: Secondary | ICD-10-CM | POA: Diagnosis not present

## 2013-08-13 DIAGNOSIS — L02419 Cutaneous abscess of limb, unspecified: Secondary | ICD-10-CM | POA: Diagnosis not present

## 2013-08-13 DIAGNOSIS — M7730 Calcaneal spur, unspecified foot: Secondary | ICD-10-CM | POA: Diagnosis not present

## 2013-08-14 ENCOUNTER — Other Ambulatory Visit: Payer: Self-pay | Admitting: Endocrinology

## 2013-08-14 DIAGNOSIS — IMO0002 Reserved for concepts with insufficient information to code with codable children: Secondary | ICD-10-CM

## 2013-08-15 ENCOUNTER — Ambulatory Visit
Admission: RE | Admit: 2013-08-15 | Discharge: 2013-08-15 | Disposition: A | Discharge status: Home / Self Care | Payer: Medicare Other | Source: Ambulatory Visit | Attending: Endocrinology | Admitting: Endocrinology

## 2013-08-15 DIAGNOSIS — IMO0002 Reserved for concepts with insufficient information to code with codable children: Secondary | ICD-10-CM

## 2013-08-15 DIAGNOSIS — L03119 Cellulitis of unspecified part of limb: Secondary | ICD-10-CM | POA: Diagnosis not present

## 2013-08-15 DIAGNOSIS — L02419 Cutaneous abscess of limb, unspecified: Secondary | ICD-10-CM | POA: Diagnosis not present

## 2013-08-16 DIAGNOSIS — L97309 Non-pressure chronic ulcer of unspecified ankle with unspecified severity: Secondary | ICD-10-CM | POA: Diagnosis not present

## 2013-08-19 DIAGNOSIS — E119 Type 2 diabetes mellitus without complications: Secondary | ICD-10-CM | POA: Diagnosis not present

## 2013-08-21 DIAGNOSIS — L0291 Cutaneous abscess, unspecified: Secondary | ICD-10-CM | POA: Diagnosis not present

## 2013-08-21 DIAGNOSIS — L0390 Cellulitis, unspecified: Secondary | ICD-10-CM | POA: Diagnosis not present

## 2013-08-21 DIAGNOSIS — E119 Type 2 diabetes mellitus without complications: Secondary | ICD-10-CM | POA: Diagnosis not present

## 2013-08-21 DIAGNOSIS — E789 Disorder of lipoprotein metabolism, unspecified: Secondary | ICD-10-CM | POA: Diagnosis not present

## 2013-08-24 DIAGNOSIS — M19079 Primary osteoarthritis, unspecified ankle and foot: Secondary | ICD-10-CM | POA: Diagnosis not present

## 2013-09-03 DIAGNOSIS — N183 Chronic kidney disease, stage 3 unspecified: Secondary | ICD-10-CM | POA: Diagnosis not present

## 2013-09-03 DIAGNOSIS — D631 Anemia in chronic kidney disease: Secondary | ICD-10-CM | POA: Diagnosis not present

## 2013-09-03 DIAGNOSIS — N2581 Secondary hyperparathyroidism of renal origin: Secondary | ICD-10-CM | POA: Diagnosis not present

## 2013-09-05 DIAGNOSIS — L97309 Non-pressure chronic ulcer of unspecified ankle with unspecified severity: Secondary | ICD-10-CM | POA: Diagnosis not present

## 2013-09-12 DIAGNOSIS — I83219 Varicose veins of right lower extremity with both ulcer of unspecified site and inflammation: Secondary | ICD-10-CM | POA: Diagnosis not present

## 2013-09-12 DIAGNOSIS — L97309 Non-pressure chronic ulcer of unspecified ankle with unspecified severity: Secondary | ICD-10-CM | POA: Diagnosis not present

## 2013-09-12 DIAGNOSIS — L97919 Non-pressure chronic ulcer of unspecified part of right lower leg with unspecified severity: Secondary | ICD-10-CM | POA: Diagnosis not present

## 2013-09-12 DIAGNOSIS — L97929 Non-pressure chronic ulcer of unspecified part of left lower leg with unspecified severity: Secondary | ICD-10-CM | POA: Diagnosis not present

## 2013-09-12 DIAGNOSIS — E1149 Type 2 diabetes mellitus with other diabetic neurological complication: Secondary | ICD-10-CM | POA: Diagnosis not present

## 2013-09-19 DIAGNOSIS — L97309 Non-pressure chronic ulcer of unspecified ankle with unspecified severity: Secondary | ICD-10-CM | POA: Diagnosis not present

## 2013-09-19 DIAGNOSIS — I83229 Varicose veins of left lower extremity with both ulcer of unspecified site and inflammation: Secondary | ICD-10-CM | POA: Diagnosis not present

## 2013-09-19 DIAGNOSIS — L97919 Non-pressure chronic ulcer of unspecified part of right lower leg with unspecified severity: Secondary | ICD-10-CM | POA: Diagnosis not present

## 2013-09-19 DIAGNOSIS — I83219 Varicose veins of right lower extremity with both ulcer of unspecified site and inflammation: Secondary | ICD-10-CM | POA: Diagnosis not present

## 2013-09-19 DIAGNOSIS — E1149 Type 2 diabetes mellitus with other diabetic neurological complication: Secondary | ICD-10-CM | POA: Diagnosis not present

## 2013-11-19 DIAGNOSIS — N2581 Secondary hyperparathyroidism of renal origin: Secondary | ICD-10-CM | POA: Diagnosis not present

## 2013-11-19 DIAGNOSIS — D631 Anemia in chronic kidney disease: Secondary | ICD-10-CM | POA: Diagnosis not present

## 2013-11-19 DIAGNOSIS — N183 Chronic kidney disease, stage 3 unspecified: Secondary | ICD-10-CM | POA: Diagnosis not present

## 2013-11-19 DIAGNOSIS — N039 Chronic nephritic syndrome with unspecified morphologic changes: Secondary | ICD-10-CM | POA: Diagnosis not present

## 2013-11-25 DIAGNOSIS — L408 Other psoriasis: Secondary | ICD-10-CM | POA: Diagnosis not present

## 2013-12-24 DIAGNOSIS — H35379 Puckering of macula, unspecified eye: Secondary | ICD-10-CM | POA: Diagnosis not present

## 2013-12-24 DIAGNOSIS — E119 Type 2 diabetes mellitus without complications: Secondary | ICD-10-CM | POA: Diagnosis not present

## 2013-12-25 DIAGNOSIS — Z125 Encounter for screening for malignant neoplasm of prostate: Secondary | ICD-10-CM | POA: Diagnosis not present

## 2013-12-25 DIAGNOSIS — E789 Disorder of lipoprotein metabolism, unspecified: Secondary | ICD-10-CM | POA: Diagnosis not present

## 2013-12-25 DIAGNOSIS — Z79899 Other long term (current) drug therapy: Secondary | ICD-10-CM | POA: Diagnosis not present

## 2013-12-25 DIAGNOSIS — N189 Chronic kidney disease, unspecified: Secondary | ICD-10-CM | POA: Diagnosis not present

## 2013-12-25 DIAGNOSIS — E119 Type 2 diabetes mellitus without complications: Secondary | ICD-10-CM | POA: Diagnosis not present

## 2014-01-01 DIAGNOSIS — E119 Type 2 diabetes mellitus without complications: Secondary | ICD-10-CM | POA: Diagnosis not present

## 2014-01-01 DIAGNOSIS — L97509 Non-pressure chronic ulcer of other part of unspecified foot with unspecified severity: Secondary | ICD-10-CM | POA: Diagnosis not present

## 2014-01-01 DIAGNOSIS — E789 Disorder of lipoprotein metabolism, unspecified: Secondary | ICD-10-CM | POA: Diagnosis not present

## 2014-01-01 DIAGNOSIS — M109 Gout, unspecified: Secondary | ICD-10-CM | POA: Diagnosis not present

## 2014-03-27 DIAGNOSIS — E119 Type 2 diabetes mellitus without complications: Secondary | ICD-10-CM | POA: Diagnosis not present

## 2014-04-03 DIAGNOSIS — R51 Headache: Secondary | ICD-10-CM | POA: Diagnosis not present

## 2014-04-03 DIAGNOSIS — E119 Type 2 diabetes mellitus without complications: Secondary | ICD-10-CM | POA: Diagnosis not present

## 2014-04-03 DIAGNOSIS — I10 Essential (primary) hypertension: Secondary | ICD-10-CM | POA: Diagnosis not present

## 2014-04-29 DIAGNOSIS — M542 Cervicalgia: Secondary | ICD-10-CM | POA: Diagnosis not present

## 2014-04-29 DIAGNOSIS — IMO0002 Reserved for concepts with insufficient information to code with codable children: Secondary | ICD-10-CM | POA: Diagnosis not present

## 2014-04-29 DIAGNOSIS — G5600 Carpal tunnel syndrome, unspecified upper limb: Secondary | ICD-10-CM | POA: Diagnosis not present

## 2014-04-29 DIAGNOSIS — R51 Headache: Secondary | ICD-10-CM | POA: Diagnosis not present

## 2014-07-14 DIAGNOSIS — E118 Type 2 diabetes mellitus with unspecified complications: Secondary | ICD-10-CM | POA: Diagnosis not present

## 2014-07-21 DIAGNOSIS — E789 Disorder of lipoprotein metabolism, unspecified: Secondary | ICD-10-CM | POA: Diagnosis not present

## 2014-07-21 DIAGNOSIS — I10 Essential (primary) hypertension: Secondary | ICD-10-CM | POA: Diagnosis not present

## 2014-07-21 DIAGNOSIS — E118 Type 2 diabetes mellitus with unspecified complications: Secondary | ICD-10-CM | POA: Diagnosis not present

## 2014-10-29 ENCOUNTER — Inpatient Hospital Stay (HOSPITAL_COMMUNITY)
Admission: EM | Admit: 2014-10-29 | Discharge: 2014-11-03 | DRG: 872 | Disposition: A | Discharge status: Home / Self Care | Payer: Medicare Other | Attending: Internal Medicine | Admitting: Internal Medicine

## 2014-10-29 ENCOUNTER — Encounter (HOSPITAL_COMMUNITY): Payer: Self-pay | Admitting: Emergency Medicine

## 2014-10-29 DIAGNOSIS — Z888 Allergy status to other drugs, medicaments and biological substances status: Secondary | ICD-10-CM

## 2014-10-29 DIAGNOSIS — M109 Gout, unspecified: Secondary | ICD-10-CM | POA: Diagnosis present

## 2014-10-29 DIAGNOSIS — N183 Chronic kidney disease, stage 3 (moderate): Secondary | ICD-10-CM | POA: Diagnosis not present

## 2014-10-29 DIAGNOSIS — Z87891 Personal history of nicotine dependence: Secondary | ICD-10-CM

## 2014-10-29 DIAGNOSIS — N179 Acute kidney failure, unspecified: Secondary | ICD-10-CM | POA: Diagnosis present

## 2014-10-29 DIAGNOSIS — I129 Hypertensive chronic kidney disease with stage 1 through stage 4 chronic kidney disease, or unspecified chronic kidney disease: Secondary | ICD-10-CM | POA: Diagnosis not present

## 2014-10-29 DIAGNOSIS — A419 Sepsis, unspecified organism: Secondary | ICD-10-CM | POA: Diagnosis not present

## 2014-10-29 DIAGNOSIS — L03115 Cellulitis of right lower limb: Secondary | ICD-10-CM | POA: Diagnosis present

## 2014-10-29 DIAGNOSIS — E119 Type 2 diabetes mellitus without complications: Secondary | ICD-10-CM

## 2014-10-29 DIAGNOSIS — L02419 Cutaneous abscess of limb, unspecified: Secondary | ICD-10-CM

## 2014-10-29 DIAGNOSIS — E669 Obesity, unspecified: Secondary | ICD-10-CM | POA: Diagnosis present

## 2014-10-29 DIAGNOSIS — L02415 Cutaneous abscess of right lower limb: Secondary | ICD-10-CM | POA: Diagnosis not present

## 2014-10-29 DIAGNOSIS — N189 Chronic kidney disease, unspecified: Secondary | ICD-10-CM | POA: Diagnosis present

## 2014-10-29 DIAGNOSIS — Z66 Do not resuscitate: Secondary | ICD-10-CM | POA: Diagnosis present

## 2014-10-29 DIAGNOSIS — I10 Essential (primary) hypertension: Secondary | ICD-10-CM | POA: Diagnosis present

## 2014-10-29 DIAGNOSIS — Z6829 Body mass index (BMI) 29.0-29.9, adult: Secondary | ICD-10-CM

## 2014-10-29 DIAGNOSIS — L0390 Cellulitis, unspecified: Secondary | ICD-10-CM | POA: Diagnosis present

## 2014-10-29 DIAGNOSIS — L03119 Cellulitis of unspecified part of limb: Secondary | ICD-10-CM

## 2014-10-29 DIAGNOSIS — E785 Hyperlipidemia, unspecified: Secondary | ICD-10-CM | POA: Diagnosis present

## 2014-10-29 LAB — CBC WITH DIFFERENTIAL/PLATELET
BASOS PCT: 0 % (ref 0–1)
Basophils Absolute: 0 10*3/uL (ref 0.0–0.1)
EOS ABS: 0.1 10*3/uL (ref 0.0–0.7)
EOS PCT: 1 % (ref 0–5)
HEMATOCRIT: 39.7 % (ref 39.0–52.0)
HEMOGLOBIN: 13.3 g/dL (ref 13.0–17.0)
LYMPHS ABS: 1.8 10*3/uL (ref 0.7–4.0)
LYMPHS PCT: 18 % (ref 12–46)
MCH: 28.1 pg (ref 26.0–34.0)
MCHC: 33.5 g/dL (ref 30.0–36.0)
MCV: 83.8 fL (ref 78.0–100.0)
MONO ABS: 0.7 10*3/uL (ref 0.1–1.0)
MONOS PCT: 7 % (ref 3–12)
Neutro Abs: 7.2 10*3/uL (ref 1.7–7.7)
Neutrophils Relative %: 74 % (ref 43–77)
PLATELETS: 289 10*3/uL (ref 150–400)
RBC: 4.74 MIL/uL (ref 4.22–5.81)
RDW: 13.1 % (ref 11.5–15.5)
WBC: 9.9 10*3/uL (ref 4.0–10.5)

## 2014-10-29 LAB — BASIC METABOLIC PANEL
ANION GAP: 11 (ref 5–15)
BUN: 22 mg/dL (ref 6–23)
CHLORIDE: 103 mmol/L (ref 96–112)
CO2: 23 mmol/L (ref 19–32)
CREATININE: 1.72 mg/dL — AB (ref 0.50–1.35)
Calcium: 9.6 mg/dL (ref 8.4–10.5)
GFR calc Af Amer: 45 mL/min — ABNORMAL LOW (ref >90)
GFR calc non Af Amer: 39 mL/min — ABNORMAL LOW (ref >90)
GLUCOSE: 169 mg/dL — AB (ref 70–99)
Potassium: 4.8 mmol/L (ref 3.5–5.1)
Sodium: 137 mmol/L (ref 135–145)

## 2014-10-29 NOTE — ED Notes (Signed)
Patient here with lower right leg swelling, redness, and warmth. States previous history of the same several times. Patient's PCP is [facility], and was put on clindamycin yesterday. Presents tonight because leg is getting worse and redness is moving proximal. Tender to palpation, obvious heat radiating from leg.

## 2014-10-29 NOTE — ED Notes (Signed)
Patient has redness to the groin states the "infection" has never been in that area before.

## 2014-10-29 NOTE — ED Provider Notes (Signed)
CSN: [id number]     Arrival date & time 10/29/14  2004 History   First MD Initiated Contact with Patient 10/29/14 2202     Chief Complaint  Patient presents with  . Recurrent Skin Infections     (Consider location/radiation/quality/duration/timing/severity/associated sxs/prior Treatment) HPI James Roberson is a 69 year old male with past medical history of diabetes, hyperlipidemia, hypertension who presents the ER complaining of right lower extremity redness, swelling. Patient states his symptoms began gradually on Monday, and have since progressed rapidly. Patient states he has had multiple admissions in the past for cellulitis of his leg, and several washout procedures for cellulitis of the same leg. Patient reports associated redness in his right upper thigh which he has never noticed before. Patient states he has not had a infection is likely the past 5 years that has required admission. Patient states he is had multiple episodes of what appeared to be infection, began oral clindamycin, and his symptoms resolved. Patient states he was evaluated by his PCP yesterday, was placed on oral clindamycin, and has not had any improvement of his symptoms since being on clindamycin. Patient denies fever, numbness, tingling, shortness of breath, chest pain, nausea, vomiting.  Past Medical History  Diagnosis Date  . Diabetes mellitus without complication   . Hyperlipidemia   . Hypertension   . Obesity    Past Surgical History  Procedure Laterality Date  . Lumbar laminectomy  1989  . Back surgery    . Leg surgery     History reviewed. No pertinent family history. History  Substance Use Topics  . Smoking status: Former Smoker    Quit date: 07/23/1966  . Smokeless tobacco: Never Used  . Alcohol Use: Yes     Comment: 1 a quarter    Review of Systems  Constitutional: Negative for fever.  HENT: Negative for trouble swallowing.   Eyes: Negative for visual disturbance.  Respiratory: Negative  for shortness of breath.   Cardiovascular: Negative for chest pain.  Gastrointestinal: Negative for nausea, vomiting and abdominal pain.  Genitourinary: Negative for dysuria.  Musculoskeletal: Negative for neck pain.  Skin: Positive for color change. Negative for rash.       Leg swelling Leg redness  Neurological: Negative for dizziness, weakness and numbness.  Psychiatric/Behavioral: Negative.       Allergies  Avelox abc; Zolpidem tartrate; and Tape  Home Medications   Prior to Admission medications   Medication Sig Start Date End Date Taking? Authorizing Provider  acitretin (SORIATANE) 25 MG capsule Take 25 mg by mouth every other day.    Yes Historical Provider, MD  allopurinol (ZYLOPRIM) 100 MG tablet Take 100 mg by mouth daily.   Yes Historical Provider, MD  atorvastatin (LIPITOR) 40 MG tablet Take 40 mg by mouth daily.   Yes Historical Provider, MD  BuPROPion HCl (WELLBUTRIN PO) Take 1 tablet by mouth daily. Patient unsure of dose. Please call pharmacy in AM if patient is admitted   Yes Historical Provider, MD  Choline Fenofibrate (TRILIPIX) 135 MG capsule Take 135 mg by mouth every other day.   Yes Historical Provider, MD  CLINDAMYCIN HCL PO Take 1 tablet by mouth 4 (four) times daily. Started medication on 10-28-14. Patient unsure of dose. Please call pharmacy in AM if patient is admitted   Yes Historical Provider, MD  insulin aspart (NOVOLOG) 100 UNIT/ML injection Inject 16 Units into the skin at bedtime.   Yes Historical Provider, MD  sitaGLIPtin (JANUVIA) 100 MG tablet Take 100 mg by mouth daily.  Yes Historical Provider, MD  valsartan-hydrochlorothiazide (DIOVAN-HCT) 320-25 MG per tablet Take 1 tablet by mouth daily.   Yes Historical Provider, MD  zolpidem (AMBIEN) 10 MG tablet Take 10 mg by mouth at bedtime as needed for sleep.   Yes Historical Provider, MD   BP 138/65 mmHg  Pulse 101  Temp(Src) 98 F (36.7 C) (Oral)  Resp 18  Ht '5\' 9"'  (1.753 m)  Wt 200 lb (90.719  kg)  BMI 29.52 kg/m2  SpO2 93% Physical Exam  Constitutional: He is oriented to person, place, and time. He appears well-developed and well-nourished. No distress.  HENT:  Head: Normocephalic and atraumatic.  Mouth/Throat: Oropharynx is clear and moist. No oropharyngeal exudate.  Eyes: Right eye exhibits no discharge. Left eye exhibits no discharge. No scleral icterus.  Neck: Normal range of motion.  Cardiovascular: Normal rate, regular rhythm and normal heart sounds.   No murmur heard. Pulmonary/Chest: Effort normal and breath sounds normal. No respiratory distress.  Abdominal: Soft. There is no tenderness.  Musculoskeletal: Normal range of motion. He exhibits no edema or tenderness.  Neurological: He is alert and oriented to person, place, and time. No cranial nerve deficit. Coordination normal.  Skin: Skin is warm and dry. No rash noted. He is not diaphoretic.  Psychiatric: He has a normal mood and affect.  Nursing note and vitals reviewed.   ED Course  Procedures (including critical care time) Labs Review Labs Reviewed  BASIC METABOLIC PANEL - Abnormal; Notable for the following:    Glucose, Bld 169 (*)    Creatinine, Ser 1.72 (*)    GFR calc non Af Amer 39 (*)    GFR calc Af Amer 45 (*)    All other components within normal limits  URINALYSIS, ROUTINE W REFLEX MICROSCOPIC - Abnormal; Notable for the following:    Hgb urine dipstick SMALL (*)    All other components within normal limits  CULTURE, BLOOD (ROUTINE X 2)  CULTURE, BLOOD (ROUTINE X 2)  CBC WITH DIFFERENTIAL/PLATELET  URINE MICROSCOPIC-ADD ON  SEDIMENTATION RATE  C-REACTIVE PROTEIN    Imaging Review No results found.   EKG Interpretation None           MDM   Final diagnoses:  Cellulitis and abscess of leg   Patient with 3 days of swelling to right lower extremity. Patient with multiple episodes of this in the past, had several procedures occluding washout procedures, return tonight after 24  hours of antibiotics by mouth. Patient reports worsening of his symptoms even after being on outpatient therapy. Although patient has not been on outpatient therapy long, patient's cellulitis is significant in his lower leg, there is streaking in his upper thigh. With patient's significant history of cellulitis in that same leg and requiring procedures and multiple admissions in the past, we'll admit patient for IV antibiotics and further workup. Spoke with Dr. Hal Hope who agrees to admit patient to [facility]. Blood cultures drawn, ESR, CRP sent. Patient placed on vancomycin per pharmacy consult. The patient appears reasonably stabilized for admission considering the current resources, flow, and capabilities available in the ED at this time, and I doubt any other [facility] requiring further screening and/or treatment in the ED prior to admission.  BP 138/65 mmHg  Pulse 101  Temp(Src) 98 F (36.7 C) (Oral)  Resp 18  Ht '5\' 9"'  (1.753 m)  Wt 200 lb (90.719 kg)  BMI 29.52 kg/m2  SpO2 93%  Signed,  Dahlia Bailiff, PA-C 12:55 AM   Patient discussed with Dr.  Ernestina Patches, MD     Dahlia Bailiff, PA-C 10/30/14 9787  Ernestina Patches, MD 10/30/14 [phone]

## 2014-10-30 ENCOUNTER — Encounter (HOSPITAL_COMMUNITY): Payer: Self-pay | Admitting: Internal Medicine

## 2014-10-30 DIAGNOSIS — I129 Hypertensive chronic kidney disease with stage 1 through stage 4 chronic kidney disease, or unspecified chronic kidney disease: Secondary | ICD-10-CM | POA: Diagnosis present

## 2014-10-30 DIAGNOSIS — A419 Sepsis, unspecified organism: Principal | ICD-10-CM

## 2014-10-30 DIAGNOSIS — E119 Type 2 diabetes mellitus without complications: Secondary | ICD-10-CM

## 2014-10-30 DIAGNOSIS — N179 Acute kidney failure, unspecified: Secondary | ICD-10-CM

## 2014-10-30 DIAGNOSIS — L0390 Cellulitis, unspecified: Secondary | ICD-10-CM | POA: Diagnosis present

## 2014-10-30 DIAGNOSIS — Z87891 Personal history of nicotine dependence: Secondary | ICD-10-CM | POA: Diagnosis not present

## 2014-10-30 DIAGNOSIS — L03115 Cellulitis of right lower limb: Secondary | ICD-10-CM

## 2014-10-30 DIAGNOSIS — Z888 Allergy status to other drugs, medicaments and biological substances status: Secondary | ICD-10-CM | POA: Diagnosis not present

## 2014-10-30 DIAGNOSIS — I10 Essential (primary) hypertension: Secondary | ICD-10-CM | POA: Diagnosis not present

## 2014-10-30 DIAGNOSIS — Z66 Do not resuscitate: Secondary | ICD-10-CM | POA: Diagnosis present

## 2014-10-30 DIAGNOSIS — E785 Hyperlipidemia, unspecified: Secondary | ICD-10-CM

## 2014-10-30 DIAGNOSIS — N189 Chronic kidney disease, unspecified: Secondary | ICD-10-CM | POA: Diagnosis present

## 2014-10-30 DIAGNOSIS — M109 Gout, unspecified: Secondary | ICD-10-CM | POA: Diagnosis present

## 2014-10-30 DIAGNOSIS — N183 Chronic kidney disease, stage 3 (moderate): Secondary | ICD-10-CM | POA: Diagnosis present

## 2014-10-30 DIAGNOSIS — Z6829 Body mass index (BMI) 29.0-29.9, adult: Secondary | ICD-10-CM | POA: Diagnosis not present

## 2014-10-30 DIAGNOSIS — E669 Obesity, unspecified: Secondary | ICD-10-CM | POA: Diagnosis present

## 2014-10-30 LAB — COMPREHENSIVE METABOLIC PANEL
ALK PHOS: 89 U/L (ref 39–117)
ALT: 46 U/L (ref 0–53)
AST: 33 U/L (ref 0–37)
Albumin: 2.8 g/dL — ABNORMAL LOW (ref 3.5–5.2)
Anion gap: 8 (ref 5–15)
BILIRUBIN TOTAL: 0.5 mg/dL (ref 0.3–1.2)
BUN: 22 mg/dL (ref 6–23)
CO2: 26 mmol/L (ref 19–32)
Calcium: 9.1 mg/dL (ref 8.4–10.5)
Chloride: 103 mmol/L (ref 96–112)
Creatinine, Ser: 1.57 mg/dL — ABNORMAL HIGH (ref 0.50–1.35)
GFR calc Af Amer: 50 mL/min — ABNORMAL LOW (ref >90)
GFR, EST NON AFRICAN AMERICAN: 43 mL/min — AB (ref >90)
Glucose, Bld: 240 mg/dL — ABNORMAL HIGH (ref 70–99)
Potassium: 3.6 mmol/L (ref 3.5–5.1)
SODIUM: 137 mmol/L (ref 135–145)
Total Protein: 6.4 g/dL (ref 6.0–8.3)

## 2014-10-30 LAB — CBC WITH DIFFERENTIAL/PLATELET
Basophils Absolute: 0 10*3/uL (ref 0.0–0.1)
Basophils Relative: 0 % (ref 0–1)
Eosinophils Absolute: 0.1 10*3/uL (ref 0.0–0.7)
Eosinophils Relative: 1 % (ref 0–5)
HCT: 37.3 % — ABNORMAL LOW (ref 39.0–52.0)
Hemoglobin: 12.4 g/dL — ABNORMAL LOW (ref 13.0–17.0)
LYMPHS ABS: 1.6 10*3/uL (ref 0.7–4.0)
LYMPHS PCT: 19 % (ref 12–46)
MCH: 27.4 pg (ref 26.0–34.0)
MCHC: 33.2 g/dL (ref 30.0–36.0)
MCV: 82.5 fL (ref 78.0–100.0)
Monocytes Absolute: 0.7 10*3/uL (ref 0.1–1.0)
Monocytes Relative: 8 % (ref 3–12)
Neutro Abs: 6.2 10*3/uL (ref 1.7–7.7)
Neutrophils Relative %: 72 % (ref 43–77)
PLATELETS: 284 10*3/uL (ref 150–400)
RBC: 4.52 MIL/uL (ref 4.22–5.81)
RDW: 13.1 % (ref 11.5–15.5)
WBC: 8.6 10*3/uL (ref 4.0–10.5)

## 2014-10-30 LAB — GLUCOSE, CAPILLARY
GLUCOSE-CAPILLARY: 224 mg/dL — AB (ref 70–99)
GLUCOSE-CAPILLARY: 233 mg/dL — AB (ref 70–99)
Glucose-Capillary: 184 mg/dL — ABNORMAL HIGH (ref 70–99)
Glucose-Capillary: 180 mg/dL — ABNORMAL HIGH (ref 70–99)

## 2014-10-30 LAB — URINALYSIS, ROUTINE W REFLEX MICROSCOPIC
Bilirubin Urine: NEGATIVE
GLUCOSE, UA: NEGATIVE mg/dL
Ketones, ur: NEGATIVE mg/dL
Leukocytes, UA: NEGATIVE
Nitrite: NEGATIVE
PH: 5 (ref 5.0–8.0)
PROTEIN: NEGATIVE mg/dL
Specific Gravity, Urine: 1.026 (ref 1.005–1.030)
Urobilinogen, UA: 0.2 mg/dL (ref 0.0–1.0)

## 2014-10-30 LAB — C-REACTIVE PROTEIN: CRP: 9.9 mg/dL — ABNORMAL HIGH (ref <0.60)

## 2014-10-30 LAB — LACTIC ACID, PLASMA
Lactic Acid, Venous: 2 mmol/L (ref 0.5–2.0)
Lactic Acid, Venous: 1.4 mmol/L (ref 0.5–2.0)

## 2014-10-30 LAB — URINE MICROSCOPIC-ADD ON

## 2014-10-30 LAB — SEDIMENTATION RATE: SED RATE: 68 mm/h — AB (ref 0–16)

## 2014-10-30 MED ORDER — ACETAMINOPHEN 650 MG RE SUPP: 650.0000 mg | Freq: Four times a day (QID) | RECTAL | Status: DC | PRN

## 2014-10-30 MED ORDER — FENOFIBRATE 160 MG PO TABS
160.0000 mg | ORAL_TABLET | Freq: Every day | ORAL | Status: DC
  Administered 2014-10-30 – 2014-11-03 (×5): 160 mg via ORAL
  Filled 2014-10-30 (×5): qty 1

## 2014-10-30 MED ORDER — ONDANSETRON HCL 4 MG/2ML IJ SOLN: 4.0000 mg | Freq: Four times a day (QID) | INTRAMUSCULAR | Status: DC | PRN

## 2014-10-30 MED ORDER — ACITRETIN 25 MG PO CAPS
25.0000 mg | ORAL_CAPSULE | ORAL | Status: DC
  Administered 2014-10-31 – 2014-11-02 (×2): 25 mg via ORAL
  Filled 2014-10-30: qty 1

## 2014-10-30 MED ORDER — HYDROCODONE-ACETAMINOPHEN 5-325 MG PO TABS
1.0000 | ORAL_TABLET | ORAL | Status: DC | PRN
  Administered 2014-10-30 – 2014-11-02 (×12): 1 via ORAL
  Filled 2014-10-30 (×13): qty 1

## 2014-10-30 MED ORDER — ACETAMINOPHEN 325 MG PO TABS: 650.0000 mg | ORAL_TABLET | Freq: Four times a day (QID) | ORAL | Status: DC | PRN

## 2014-10-30 MED ORDER — MORPHINE SULFATE 2 MG/ML IJ SOLN
1.0000 mg | INTRAMUSCULAR | Status: DC | PRN
  Administered 2014-10-30 – 2014-11-02 (×5): 1 mg via INTRAVENOUS
  Filled 2014-10-30 (×5): qty 1

## 2014-10-30 MED ORDER — PIPERACILLIN-TAZOBACTAM 3.375 G IVPB
3.3750 g | Freq: Three times a day (TID) | INTRAVENOUS | Status: DC
  Administered 2014-10-30 – 2014-11-03 (×14): 3.375 g via INTRAVENOUS
  Filled 2014-10-30 (×16): qty 50

## 2014-10-30 MED ORDER — INSULIN ASPART 100 UNIT/ML ~~LOC~~ SOLN
0.0000 [IU] | Freq: Three times a day (TID) | SUBCUTANEOUS | Status: DC
  Administered 2014-10-30 (×2): 3 [IU] via SUBCUTANEOUS
  Administered 2014-10-30: 2 [IU] via SUBCUTANEOUS
  Administered 2014-10-31: 3 [IU] via SUBCUTANEOUS
  Administered 2014-10-31 (×2): 2 [IU] via SUBCUTANEOUS
  Administered 2014-11-01 (×2): 3 [IU] via SUBCUTANEOUS
  Administered 2014-11-01 – 2014-11-02 (×2): 2 [IU] via SUBCUTANEOUS
  Administered 2014-11-02: 3 [IU] via SUBCUTANEOUS
  Administered 2014-11-02 – 2014-11-03 (×2): 2 [IU] via SUBCUTANEOUS
  Administered 2014-11-03: 3 [IU] via SUBCUTANEOUS

## 2014-10-30 MED ORDER — ZOLPIDEM TARTRATE 5 MG PO TABS
5.0000 mg | ORAL_TABLET | Freq: Every evening | ORAL | Status: DC | PRN
Start: 2014-10-30 — End: 2014-11-03
  Administered 2014-10-30 – 2014-11-02 (×4): 5 mg via ORAL
  Filled 2014-10-30 (×4): qty 1

## 2014-10-30 MED ORDER — VANCOMYCIN HCL 10 G IV SOLR
1500.0000 mg | INTRAVENOUS | Status: DC
  Administered 2014-10-31 – 2014-11-03 (×4): 1500 mg via INTRAVENOUS
  Filled 2014-10-30 (×5): qty 1500

## 2014-10-30 MED ORDER — ATORVASTATIN CALCIUM 40 MG PO TABS
40.0000 mg | ORAL_TABLET | Freq: Every day | ORAL | Status: DC
  Administered 2014-10-30 – 2014-11-03 (×5): 40 mg via ORAL
  Filled 2014-10-30 (×5): qty 1

## 2014-10-30 MED ORDER — ENOXAPARIN SODIUM 40 MG/0.4ML ~~LOC~~ SOLN
40.0000 mg | Freq: Every day | SUBCUTANEOUS | Status: DC
Start: 2014-10-30 — End: 2014-11-03
  Administered 2014-10-30 – 2014-11-03 (×5): 40 mg via SUBCUTANEOUS
  Filled 2014-10-30 (×5): qty 0.4

## 2014-10-30 MED ORDER — LINAGLIPTIN 5 MG PO TABS
5.0000 mg | ORAL_TABLET | Freq: Every day | ORAL | Status: DC
Start: 2014-10-30 — End: 2014-11-03
  Administered 2014-10-30 – 2014-11-03 (×5): 5 mg via ORAL
  Filled 2014-10-30 (×5): qty 1

## 2014-10-30 MED ORDER — VANCOMYCIN HCL 10 G IV SOLR
1500.0000 mg | Freq: Once | INTRAVENOUS | Status: AC
  Administered 2014-10-30: 1500 mg via INTRAVENOUS
  Filled 2014-10-30: qty 1500

## 2014-10-30 MED ORDER — PIPERACILLIN-TAZOBACTAM 3.375 G IVPB 30 MIN: 3.3750 g | Freq: Once | INTRAVENOUS | Status: DC

## 2014-10-30 MED ORDER — VANCOMYCIN HCL IN DEXTROSE 1-5 GM/200ML-% IV SOLN: 1000.0000 mg | Freq: Once | INTRAVENOUS | Status: DC

## 2014-10-30 MED ORDER — ONDANSETRON HCL 4 MG PO TABS: 4.0000 mg | ORAL_TABLET | Freq: Four times a day (QID) | ORAL | Status: DC | PRN

## 2014-10-30 MED ORDER — SODIUM CHLORIDE 0.9 % IV SOLN
INTRAVENOUS | Status: AC
  Administered 2014-10-30: 02:00:00 via INTRAVENOUS

## 2014-10-30 MED ORDER — INSULIN DETEMIR 100 UNIT/ML ~~LOC~~ SOLN
16.0000 [IU] | Freq: Every day | SUBCUTANEOUS | Status: DC
  Administered 2014-10-30 – 2014-10-31 (×3): 16 [IU] via SUBCUTANEOUS
  Filled 2014-10-30 (×4): qty 0.16

## 2014-10-30 MED ORDER — ALLOPURINOL 100 MG PO TABS
100.0000 mg | ORAL_TABLET | Freq: Every day | ORAL | Status: DC
  Administered 2014-10-30 – 2014-11-03 (×5): 100 mg via ORAL
  Filled 2014-10-30 (×5): qty 1

## 2014-10-30 NOTE — Progress Notes (Signed)
[facility]                                                                              Patient Demographics  James Roberson, is a 69 y.o. male, DOB - 09/17/1945, AVW:[id number]  Admit date - 10/29/2014   Admitting Physician Eduard Clos, MD  Outpatient Primary MD for the patient is Cliffton Asters, MD  LOS - 0   Chief Complaint  Patient presents with  . Recurrent Skin Infections      HPI on 10/30/2014 by Dr. Midge Minium James Roberson is a 69 y.o. male with history of recurrent cellulitis, diabetes mellitus type 2, hypertension, hyperlipidemia, gout, chronic kidney disease presents to the ER because of worsening swelling and erythema of his right lower ex 20. Patient has noticed erythema and swelling on his distal right leg 3 days ago which has progressively worsened. Patient denies any fever chills. Patient was prescribed clindamycin and had taken it for last 24 hours. Despite being on clindamycin patient's erythema has progressed proximally to his thighs. Patient has been admitted for IV antibiotics for his cellulitis.  Assessment & Plan   Sepsis secondary to RLE cellulitis -Up on admission, patient was tachycardic with a lactic acid of 2, CRP of 9.9 -vital signs have normalized, lactic acid currently 1.4 -Continue vancomycin and Zosyn -Posterior extremity Doppler pending  Diabetes Mellitus, type 2 -Metformin held -Continue Levemir, Januvia, insulin sliding scale a CBG monitoring  Hypertension -Patient currently on valsartan/HCTZ at home -Currently held, blood pressure stable  Hyperlipidemia  -Continue statin and fenofibrate  History of gout -Continue allopurinol  Chronic kidney disease, stage III -Creatinine slightly improved  Code Status: DNR  Family Communication: None at bedside  Disposition Plan: Admitted this morning, continue IV antibiotics  Time Spent in minutes   30 minutes  Procedures  None  Consults   None  DVT Prophylaxis   Lovenox  Lab Results  Component Value Date   PLT 284 10/30/2014    Medications  Scheduled Meds: . [START ON 10/31/2014] acitretin  25 mg Oral QODAY  . allopurinol  100 mg Oral Daily  . atorvastatin  40 mg Oral Daily  . enoxaparin (LOVENOX) injection  40 mg Subcutaneous Daily  . fenofibrate  160 mg Oral Daily  . insulin aspart  0-9 Units Subcutaneous TID WC  . insulin detemir  16 Units Subcutaneous QHS  . linagliptin  5 mg Oral Daily  . piperacillin-tazobactam (ZOSYN)  IV  3.375 g Intravenous Q8H  . [START ON 10/31/2014] vancomycin  1,500 mg Intravenous Q24H   Continuous Infusions: . sodium chloride 75 mL/hr at 10/30/14 0219   PRN Meds:.acetaminophen **OR** acetaminophen, HYDROcodone-acetaminophen, morphine injection, ondansetron **OR** ondansetron (ZOFRAN) IV, zolpidem  Antibiotics    Anti-infectives    Start     Dose/Rate Route Frequency Ordered Stop   10/31/14 0100  vancomycin (VANCOCIN) 1,500 mg in sodium chloride 0.9 % 500 mL IVPB     1,500 mg 250 mL/hr over 120 Minutes Intravenous Every 24 hours 10/30/14 0022     10/30/14 0200  piperacillin-tazobactam (ZOSYN) IVPB 3.375 g     3.375 g 12.5 mL/hr over 240 Minutes Intravenous Every 8 hours 10/30/14 0110  10/30/14 0115  piperacillin-tazobactam (ZOSYN) IVPB 3.375 g  Status:  Discontinued     3.375 g 100 mL/hr over 30 Minutes Intravenous  Once 10/30/14 0108 10/30/14 0109   10/30/14 0115  vancomycin (VANCOCIN) IVPB 1000 mg/200 mL premix  Status:  Discontinued     1,000 mg 200 mL/hr over 60 Minutes Intravenous  Once 10/30/14 0108 10/30/14 0110   10/30/14 0100  vancomycin (VANCOCIN) 1,500 mg in sodium chloride 0.9 % 500 mL IVPB     1,500 mg 250 mL/hr over 120 Minutes Intravenous  Once 10/30/14 0021 10/30/14 [id number]        Subjective:   James Roberson seen and examined today.  Patient feels his leg swelling has improved some, but continues to have pain in his right calf.  Denies chest pain, shortness of breath, abdominal  pain.   Objective:   Filed Vitals:   10/30/14 0106 10/30/14 0107 10/30/14 0520 10/30/14 1301  BP: 146/69  133/70 139/66  Pulse:    90  Temp: 99.1 F (37.3 C)  98.9 F (37.2 C) 97.5 F (36.4 C)  TempSrc: Oral  Oral Oral  Resp: 20  16   Height:  5\' 9"  (1.753 m)    Weight:  91.3 kg (201 lb 4.5 oz)    SpO2: 95%  96% 98%    Wt Readings from Last 3 Encounters:  10/30/14 91.3 kg (201 lb 4.5 oz)  07/23/13 92.534 kg (204 lb)  10/21/08 88.043 kg (194 lb 1.6 oz)     Intake/Output Summary (Last 24 hours) at 10/30/14 1306 Last data filed at 10/30/14 [id number]  Gross per 24 hour  Intake 1111.25 ml  Output    450 ml  Net 661.25 ml    Exam  General: Well developed, well nourished, NAD, appears stated age  HEENT: NCAT, mucous membranes moist.   Cardiovascular: S1 S2 auscultated, no rubs, murmurs or gallops. Regular rate and rhythm.  Respiratory: Clear to auscultation bilaterally with equal chest rise  Abdomen: Soft, nontender, nondistended, + bowel sounds  Extremities: warm dry without cyanosis clubbing. RLE edema and erythema, area of erythema on right inner thigh.    Neuro: AAOx3, cranial nerves grossly intact. 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  Radiology Reports No results found.  CBC  Recent Labs Lab 10/29/14 2317 10/30/14 0422  WBC 9.9 8.6  HGB 13.3 12.4*  HCT 39.7 37.3*  PLT 289 284  MCV 83.8 82.5  MCH 28.1 27.4  MCHC 33.5 33.2  RDW 13.1 13.1  LYMPHSABS 1.8 1.6  MONOABS 0.7 0.7  EOSABS 0.1 0.1  BASOSABS 0.0 0.0    Chemistries   Recent Labs Lab 10/29/14 2317 10/30/14 0422  NA 137 137  K 4.8 3.6  CL 103 103  CO2 23 26  GLUCOSE 169* 240*  BUN 22 22    CREATININE 1.72* 1.57*  CALCIUM 9.6 9.1  AST  --  33  ALT  --  46  ALKPHOS  --  89  BILITOT  --  0.5   ------------------------------------------------------------------------------------------------------------------ estimated creatinine clearance is 49.6 mL/min (by C-G formula based on Cr of 1.57). ------------------------------------------------------------------------------------------------------------------ No results for input(s): HGBA1C in the last 72 hours. ------------------------------------------------------------------------------------------------------------------ No results for input(s): CHOL, HDL, LDLCALC,  TRIG, CHOLHDL, LDLDIRECT in the last 72 hours. ------------------------------------------------------------------------------------------------------------------ No results for input(s): TSH, T4TOTAL, T3FREE, THYROIDAB in the last 72 hours.  Invalid input(s): FREET3 ------------------------------------------------------------------------------------------------------------------ No results for input(s): VITAMINB12, FOLATE, FERRITIN, TIBC, IRON, RETICCTPCT in the last 72 hours.  Coagulation profile No results for input(s): INR, PROTIME in the last 168 hours.  No results for input(s): DDIMER in the last 72 hours.  Cardiac Enzymes No results for input(s): CKMB, TROPONINI, MYOGLOBIN in the last 168 hours.  Invalid input(s): CK ------------------------------------------------------------------------------------------------------------------ Invalid input(s): POCBNP    Saoirse Legere D.O. on 10/30/2014 at 1:06 PM  Between 7am to 7pm - Pager - [phone]  After 7pm go to [email or web address] - password [email or web address]  And look for the night coverage person covering for me after hours  [facility] Office  [phone]

## 2014-10-30 NOTE — Progress Notes (Signed)
Inpatient Diabetes Program Recommendations  AACE/ADA: New Consensus Statement on Inpatient Glycemic Control (2013)  Target Ranges:  Prepandial:   less than 140 mg/dL      Peak postprandial:   less than 180 mg/dL (1-2 hours)      Critically ill patients:  140 - 180 mg/dL   Results for James Roberson, Xylon S (MRN [id number]) as of 10/30/2014 09:52  Ref. Range 10/30/2014 08:26  Glucose-Capillary Latest Range: 70-99 mg/dL 409224 (H)    Reason for assessment: new admission, cbg elevated  Diabetes history: Type 2 Outpatient Diabetes medications: Januvia 100mg /day Current orders for Inpatient glycemic control: Levemir 16 units q day, Novolog 0-9 units tid with meals, Trajenta 5mg /day.   Please consider ordering a HgA1C to determine blood sugar control over the past three months- this will help us determine discharge medication needs.   Susette RacerJulie Nya Monds, RN, BA, MHA, CDE Diabetes Coordinator Inpatient Diabetes Program  [phone] (Team Pager) [phone] Patrcia Dolly([place] Office) 10/30/2014 9:56 AM

## 2014-10-30 NOTE — Progress Notes (Signed)
Patient arrived to assigned room @ 0100 via stretcher, ambulated from stretcher to bed using can and 1 assist, gait unsteady due to cellulitis of right lower ext, Patient alert, oriented, wife at bedside, SR up, call bell in reach. MD at bedside at this time. Will monitor

## 2014-10-30 NOTE — Care Management Note (Unsigned)
    Page 1 of 1   10/30/2014     6:12:41 PM CARE MANAGEMENT NOTE 10/30/2014  Patient:  James Roberson,James Roberson   Account Number:  [id number]  Date Initiated:  10/30/2014  Documentation initiated by:  Letha CapeAYLOR,Broc Caspers  Subjective/Objective Assessment:   dx l foot cellulitis  admit- from home with spouse.     Action/Plan:   Anticipated DC Date:  11/01/2014   Anticipated DC Plan:  HOME/SELF CARE      DC Planning Services  CM consult      Choice offered to / List presented to:             Status of service:  In process, will continue to follow Medicare Important Message given?  NA - LOS <3 / Initial given by admissions (If response is "NO", the following Medicare IM given date fields will be blank) Date Medicare IM given:   Medicare IM given by:   Date Additional Medicare IM given:   Additional Medicare IM given by:    Discharge Disposition:    Per UR Regulation:  Reviewed for med. necessity/level of care/duration of stay  If discussed at Long Length of Stay Meetings, dates discussed:    Comments:  10/30/14 1811 Letha Capeeborah Gibbs Naugle RN, BSN [phone] NCM will cont to follow for dc needs.

## 2014-10-30 NOTE — Progress Notes (Signed)
Utilization review completed.  

## 2014-10-30 NOTE — Progress Notes (Signed)
Dr Toniann FailKakrakandy sent message that patient wants to be a DNR

## 2014-10-30 NOTE — ED Notes (Signed)
IV attempt unsuccessful

## 2014-10-30 NOTE — H&P (Addendum)
[facility] History and Physical  CHAPMAN MATTEUCCI YNW:[id number] DOB: August 04, 1946 DOA: 10/29/2014  Referring physician: ER physician. PCP: Cliffton Asters, MD Dr. Juleen China.  Chief Complaint: Right lower extremity worsening erythema and swelling.  HPI: James Roberson is a 69 y.o. male with history of recurrent cellulitis, diabetes mellitus type 2, hypertension, hyperlipidemia, gout, chronic kidney disease presents to the ER because of worsening swelling and erythema of his right lower ex 20. Patient has noticed erythema and swelling on his distal right leg 3 days ago which has progressively worsened. Patient denies any fever chills. Patient was prescribed clindamycin and had taken it for last 24 hours. Despite being on clindamycin patient's erythema has progressed proximally to his thighs. Patient has been admitted for IV antibiotics for his cellulitis.   Review of Systems: As presented in the history of presenting illness, rest negative.  Past Medical History  Diagnosis Date  . Diabetes mellitus without complication   . Hyperlipidemia   . Hypertension   . Obesity    Past Surgical History  Procedure Laterality Date  . Lumbar laminectomy  1989  . Back surgery    . Leg surgery     Social History:  reports that he quit smoking about 48 years ago. He has never used smokeless tobacco. He reports that he drinks alcohol. His drug history is not on file. Where does patient live home. Can patient participate in ADLs? Yes.  Allergies  Allergen Reactions  . Avelox Abc [Moxifloxacin]   . Zolpidem Tartrate     REACTION: hallucinations  . Tape Rash    Family History:  Family History  Problem Relation Age of Onset  . CAD Father       Prior to Admission medications   Medication Sig Start Date End Date Taking? Authorizing Provider  acitretin (SORIATANE) 25 MG capsule Take 25 mg by mouth every other day.    Yes Historical Provider, MD  allopurinol (ZYLOPRIM) 100 MG tablet Take 100 mg  by mouth daily.   Yes Historical Provider, MD  atorvastatin (LIPITOR) 40 MG tablet Take 40 mg by mouth daily.   Yes Historical Provider, MD  BuPROPion HCl (WELLBUTRIN PO) Take 1 tablet by mouth daily. Patient unsure of dose. Please call pharmacy in AM if patient is admitted   Yes Historical Provider, MD  Choline Fenofibrate (TRILIPIX) 135 MG capsule Take 135 mg by mouth every other day.   Yes Historical Provider, MD  CLINDAMYCIN HCL PO Take 1 tablet by mouth 4 (four) times daily. Started medication on 10-28-14. Patient unsure of dose. Please call pharmacy in AM if patient is admitted   Yes Historical Provider, MD  insulin aspart (NOVOLOG) 100 UNIT/ML injection Inject 16 Units into the skin at bedtime.   Yes Historical Provider, MD  sitaGLIPtin (JANUVIA) 100 MG tablet Take 100 mg by mouth daily.   Yes Historical Provider, MD  valsartan-hydrochlorothiazide (DIOVAN-HCT) 320-25 MG per tablet Take 1 tablet by mouth daily.   Yes Historical Provider, MD  zolpidem (AMBIEN) 10 MG tablet Take 10 mg by mouth at bedtime as needed for sleep.   Yes Historical Provider, MD    Physical Exam: Filed Vitals:   10/29/14 2330 10/30/14 0015 10/30/14 0106 10/30/14 0107  BP: 142/65 138/65 146/69   Pulse: 96 101    Temp:   99.1 F (37.3 C)   TempSrc:   Oral   Resp:   20   Height:  (1.753 m)    (1.753 m)  Weight: 90.719  kg (200 lb)   91.3 kg (201 lb 4.5 oz)  SpO2: 96% 93% 95%      General:  Well-developed and nourished.  Eyes: Anicteric no pallor.  ENT: No discharge from the ears eyes nose or mouth.  Neck: No mass felt.  Cardiovascular: S1-S2 heard.  Respiratory: No rhonchi or crepitations.  Abdomen: Soft nontender bowel sounds present.  Skin: Patient has erythema extending from the distal right lower extremity up to the knee and there is also erythema in the medial aspect of the thigh. Patient has chronic skin changes of the palm and plantar aspects secondary to palmar plantar  pustulosis.  Musculoskeletal: Edema of the right lower extremity.  Psychiatric: Appears normal.  Neurologic: Alert and oriented to time place and person. Moves all extremities.  Labs on Admission:  Basic Metabolic Panel:  Recent Labs Lab 10/29/14 2317  NA 137  K 4.8  CL 103  CO2 23  GLUCOSE 169*  BUN 22  CREATININE 1.72*  CALCIUM 9.6   Liver Function Tests: No results for input(s): AST, ALT, ALKPHOS, BILITOT, PROT, ALBUMIN in the last 168 hours. No results for input(s): LIPASE, AMYLASE in the last 168 hours. No results for input(s): AMMONIA in the last 168 hours. CBC:  Recent Labs Lab 10/29/14 2317  WBC 9.9  NEUTROABS 7.2  HGB 13.3  HCT 39.7  MCV 83.8  PLT 289   Cardiac Enzymes: No results for input(s): CKTOTAL, CKMB, CKMBINDEX, TROPONINI in the last 168 hours.  BNP (last 3 results) No results for input(s): BNP in the last 8760 hours.  ProBNP (last 3 results) No results for input(s): PROBNP in the last 8760 hours.  CBG: No results for input(s): GLUCAP in the last 168 hours.  Radiological Exams on Admission: No results found.   Assessment/Plan Principal Problem:   Cellulitis Active Problems:   Essential hypertension   Diabetes mellitus type 2, controlled   Hyperlipidemia   Renal failure (ARF), acute on chronic   Sepsis   1. Sepsis secondary to Right lower extremity cellulitis - patient has similar areas extending from the distal right lower extremity to the proximal thigh. Does not have any symptoms concerning for compartment syndrome at this time. Patient has been placed on vancomycin and Zosyn. Keep right leg elevated while lying down. Closely observe. Check Dopplers to rule out DVT. Check lactate levels. And gently hydrate. 2. Diabetes mellitus type 2 - continue Levemir and aspart sliding scale coverage. Since patient has chronic kidney disease we will hold off metformin for now. Continue Januvia. 3. Hypertension - continue present medications.  Patient is on ARB. Closely follow metabolic panel given that patient has chronic kidney disease. Holding of HCTZ secondary to patient getting IV hydration. 4. Hyperlipidemia - on statins. 5. History of gout - on allopurinol. 6. Chronic kidney disease stage III - closely follow metabolic panel. See #3.   DVT Prophylaxis Lovenox.  Code Status: Full code.  Family Communication: Patient's wife at the bedside.  Disposition Plan: Admit to inpatient.    Novie Maggio N. [facility] Pager [phone].  If 7PM-7AM, please contact night-coverage [email or web address] Password [email or web address] 10/30/2014, 1:47 AM

## 2014-10-30 NOTE — Progress Notes (Signed)
ANTIBIOTIC CONSULT NOTE - INITIAL  Pharmacy Consult for Vancomcyin and Zosyn Indication:  Cellulitis  Allergies  Allergen Reactions  . Avelox Abc [Moxifloxacin]   . Zolpidem Tartrate     REACTION: hallucinations  . Tape Rash    Patient Measurements: Height: 5\' 9"  (175.3 cm) Weight: 201 lb 4.5 oz (91.3 kg) IBW/kg (Calculated) : 70.7   Vital Signs: Temp: 99.1 F (37.3 C) (03/24 0106) Temp Source: Oral (03/24 0106) BP: 146/69 mmHg (03/24 0106) Pulse Rate: 101 (03/24 0015) Intake/Output from previous day:   Intake/Output from this shift:    Labs:  Recent Labs  10/29/14 2317  WBC 9.9  HGB 13.3  PLT 289  CREATININE 1.72*   Estimated Creatinine Clearance: 45.2 mL/min (by C-G formula based on Cr of 1.72). No results for input(s): VANCOTROUGH, VANCOPEAK, VANCORANDOM, GENTTROUGH, GENTPEAK, GENTRANDOM, TOBRATROUGH, TOBRAPEAK, TOBRARND, AMIKACINPEAK, AMIKACINTROU, AMIKACIN in the last 72 hours.   Microbiology: No results found for this or any previous visit (from the past 720 hour(s)).  Medical History: Past Medical History  Diagnosis Date  . Diabetes mellitus without complication   . Hyperlipidemia   . Hypertension   . Obesity      Assessment: 69 y.o male presented to the ER complaining of right lower extremity redness, swelling. He has had multiple admissions in the past for cellulitis of his leg, and several washout procedures for cellulitis of the same leg. Pt states he was evaluated by his PCP on 10/28/14, was placed on oral clindamycin, and has not had any improvement of his symptoms since being on clindamycin. Pharmacy consulted to dose IV vancomycin and zosyn for cellulitis. SCr 1.72, estimated CrCl ~ 45 ml/min.   Goal of Therapy:  Vancomycin trough level 10-15 mcg/ml  Plan:  Vancomycin 1500 mg IV q24h Zosyn 3.375 gm IV q8h (infuse each dose over 4hrs) Monitor clinical status, renal function daily.    James Roberson, RPh Clinical Pharmacist Pager:  [phone] 10/30/2014,3:20 AM

## 2014-10-30 NOTE — Progress Notes (Signed)
Patient refuses to elevate right leg on pillow, states "I've tried it all and it doesn't help".

## 2014-10-30 NOTE — Progress Notes (Signed)
Admission paperwork/assessment complete, patient oriented to room, call bell, bed controls and all orders with verbal understanding. Medicated per orders, IVF infusing to left upper arm without problems. Patient instructed to call for assistance to go to bathroom with verbal understanding. Refusing to watch safety video at this time. Will monitor.

## 2014-10-31 LAB — GLUCOSE, CAPILLARY
GLUCOSE-CAPILLARY: 256 mg/dL — AB (ref 70–99)
Glucose-Capillary: 172 mg/dL — ABNORMAL HIGH (ref 70–99)
Glucose-Capillary: 187 mg/dL — ABNORMAL HIGH (ref 70–99)
Glucose-Capillary: 233 mg/dL — ABNORMAL HIGH (ref 70–99)

## 2014-10-31 LAB — BASIC METABOLIC PANEL
ANION GAP: 10 (ref 5–15)
BUN: 12 mg/dL (ref 6–23)
CO2: 27 mmol/L (ref 19–32)
CREATININE: 1.36 mg/dL — AB (ref 0.50–1.35)
Calcium: 9.3 mg/dL (ref 8.4–10.5)
Chloride: 99 mmol/L (ref 96–112)
GFR calc non Af Amer: 52 mL/min — ABNORMAL LOW (ref >90)
GFR, EST AFRICAN AMERICAN: 60 mL/min — AB (ref >90)
Glucose, Bld: 234 mg/dL — ABNORMAL HIGH (ref 70–99)
POTASSIUM: 3.6 mmol/L (ref 3.5–5.1)
Sodium: 136 mmol/L (ref 135–145)

## 2014-10-31 LAB — CBC
HCT: 37.5 % — ABNORMAL LOW (ref 39.0–52.0)
HEMOGLOBIN: 12.4 g/dL — AB (ref 13.0–17.0)
MCH: 27.4 pg (ref 26.0–34.0)
MCHC: 33.1 g/dL (ref 30.0–36.0)
MCV: 83 fL (ref 78.0–100.0)
Platelets: 332 10*3/uL (ref 150–400)
RBC: 4.52 MIL/uL (ref 4.22–5.81)
RDW: 13.2 % (ref 11.5–15.5)
WBC: 8 10*3/uL (ref 4.0–10.5)

## 2014-10-31 NOTE — Progress Notes (Signed)
[facility]                                                                              Patient Demographics  James Roberson, is a 69 y.o. male, DOB - 1946/07/06, ZOX:[id number]  Admit date - 10/29/2014   Admitting Physician Eduard ClosArshad N Kakrakandy, MD  Outpatient Primary MD for the patient is Cliffton AstersJohn Campbell, MD  LOS - 1   Chief Complaint  Patient presents with  . Recurrent Skin Infections      HPI on 10/30/2014 by Dr. Midge MiniumArshad Kakrakandy Wynne DustJonathan S Roberson is a 69 y.o. male with history of recurrent cellulitis, diabetes mellitus type 2, hypertension, hyperlipidemia, gout, chronic kidney disease presents to the ER because of worsening swelling and erythema of his right lower ex 20. Patient has noticed erythema and swelling on his distal right leg 3 days ago which has progressively worsened. Patient denies any fever chills. Patient was prescribed clindamycin and had taken it for last 24 hours. Despite being on clindamycin patient's erythema has progressed proximally to his thighs. Patient has been admitted for IV antibiotics for his cellulitis.  Assessment & Plan   Sepsis secondary to RLE cellulitis -Up on admission, patient was tachycardic with a lactic acid of 2, CRP of 9.9 -vital signs have normalized, lactic acid currently 1.4 -Erythema and swelling improving -Continue vancomycin and Zosyn -Lower extremity Doppler pending  Diabetes Mellitus, type 2 -Metformin held -Continue Levemir, Januvia, insulin sliding scale a CBG monitoring  Hypertension -Patient currently on valsartan/HCTZ at home -Currently held, blood pressure stable  Hyperlipidemia  -Continue statin and fenofibrate  History of gout -Continue allopurinol  Chronic kidney disease, stage III -Creatinine  Improving, Currently 1.36  Code Status: DNR  Family Communication: None at bedside  Disposition Plan: Admitted, continue IV antibiotics  Time Spent in minutes   30 minutes  Procedures  None  Consults     None  DVT Prophylaxis  Lovenox  Lab Results  Component Value Date   PLT 332 10/31/2014    Medications  Scheduled Meds: . acitretin  25 mg Oral QODAY  . allopurinol  100 mg Oral Daily  . atorvastatin  40 mg Oral Daily  . enoxaparin (LOVENOX) injection  40 mg Subcutaneous Daily  . fenofibrate  160 mg Oral Daily  . insulin aspart  0-9 Units Subcutaneous TID WC  . insulin detemir  16 Units Subcutaneous QHS  . linagliptin  5 mg Oral Daily  . piperacillin-tazobactam (ZOSYN)  IV  3.375 g Intravenous Q8H  . vancomycin  1,500 mg Intravenous Q24H   Continuous Infusions:   PRN Meds:.acetaminophen **OR** acetaminophen, HYDROcodone-acetaminophen, morphine injection, ondansetron **OR** ondansetron (ZOFRAN) IV, zolpidem  Antibiotics    Anti-infectives    Start     Dose/Rate Route Frequency Ordered Stop   10/31/14 0100  vancomycin (VANCOCIN) 1,500 mg in sodium chloride 0.9 % 500 mL IVPB     1,500 mg 250 mL/hr over 120 Minutes Intravenous Every 24 hours 10/30/14 0022     10/30/14 0200  piperacillin-tazobactam (ZOSYN) IVPB 3.375 g     3.375 g 12.5 mL/hr over 240 Minutes Intravenous Every 8 hours 10/30/14 0110     10/30/14 0115  piperacillin-tazobactam (ZOSYN)  IVPB 3.375 g  Status:  Discontinued     3.375 g 100 mL/hr over 30 Minutes Intravenous  Once 10/30/14 0108 10/30/14 0109   10/30/14 0115  vancomycin (VANCOCIN) IVPB 1000 mg/200 mL premix  Status:  Discontinued     1,000 mg 200 mL/hr over 60 Minutes Intravenous  Once 10/30/14 0108 10/30/14 0110   10/30/14 0100  vancomycin (VANCOCIN) 1,500 mg in sodium chloride 0.9 % 500 mL IVPB     1,500 mg 250 mL/hr over 120 Minutes Intravenous  Once 10/30/14 0021 10/30/14 1610        Subjective:   Tzvi Economou seen and examined today.  Patient feels his leg swelling and pain have improved.  Denies chest pain, shortness of breath, abdominal pain.   Objective:   Filed Vitals:   10/30/14 0520 10/30/14 1301 10/30/14 2106 10/31/14 0543   BP: 133/70 139/66 133/69 132/79  Pulse:  90 83 80  Temp: 98.9 F (37.2 C) 97.5 F (36.4 C) 98.4 F (36.9 C) 98.1 F (36.7 C)  TempSrc: Oral Oral Oral Oral  Resp: Height:      Weight:      SpO2: 96% 98% 99% 98%    Wt Readings from Last 3 Encounters:  10/30/14 91.3 kg (201 lb 4.5 oz)  07/23/13 92.534 kg (204 lb)  10/21/08 88.043 kg (194 lb 1.6 oz)     Intake/Output Summary (Last 24 hours) at 10/31/14 1321 Last data filed at 10/31/14 0300  Gross per 24 hour  Intake   1070 ml  Output    400 ml  Net    670 ml    Exam  General: Well developed, well nourished, NAD  HEENT: NCAT, mucous membranes moist.   Cardiovascular: S1 S2 auscultated, no murmurs, RRR  Respiratory: Clear to auscultation  Abdomen: Soft, nontender, nondistended, + bowel sounds  Extremities: warm dry without cyanosis clubbing. RLE edema and erythema- improving, area of erythema on right inner thigh.    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  Psych: Appropriate mood and affect  Data Review   Micro Results Recent Results (from the past 240 hour(s))  Blood culture (routine x 2)     Status: None (Preliminary result)   Collection Time: 10/30/14 12:30 AM  Result Value Ref Range Status   Specimen Description BLOOD RIGHT ARM  Final   Special Requests BOTTLES DRAWN AEROBIC AND ANAEROBIC 5 CC  Final   Culture   Final           BLOOD CULTURE RECEIVED NO GROWTH TO DATE CULTURE WILL BE HELD FOR 5 DAYS BEFORE ISSUING A FINAL NEGATIVE REPORT Performed at [facility]    Report Status PENDING  Incomplete  Blood culture (routine x 2)     Status: None (Preliminary result)   Collection Time: 10/30/14 12:34 AM  Result Value Ref Range Status   Specimen Description BLOOD LEFT UPPER ARM  Final   Special Requests BOTTLES DRAWN AEROBIC AND ANAEROBIC 5 CC  Final   Culture   Final           BLOOD CULTURE RECEIVED NO GROWTH TO  DATE CULTURE WILL BE HELD FOR 5 DAYS BEFORE ISSUING A FINAL NEGATIVE REPORT Performed at [facility]    Report Status PENDING  Incomplete    Radiology Reports No results found.  CBC  Recent Labs Lab 10/29/14 2317 10/30/14 0422 10/31/14 0942  WBC 9.9  8.6 8.0  HGB 13.3 12.4* 12.4*  HCT 39.7 37.3* 37.5*  PLT 289 284 332  MCV 83.8 82.5 83.0  MCH 28.1 27.4 27.4  MCHC 33.5 33.2 33.1  RDW 13.1 13.1 13.2  LYMPHSABS 1.8 1.6  --   MONOABS 0.7 0.7  --   EOSABS 0.1 0.1  --   BASOSABS 0.0 0.0  --     Chemistries   Recent Labs Lab 10/29/14 2317 10/30/14 0422 10/31/14 0942  NA 137 137 136  K 4.8 3.6 3.6  CL 103 103 99  CO2 GLUCOSE 169* 240* 234*  BUN CREATININE 1.72* 1.57* 1.36*  CALCIUM 9.6 9.1 9.3  AST  --  33  --   ALT  --  46  --   ALKPHOS  --  89  --   BILITOT  --  0.5  --    ------------------------------------------------------------------------------------------------------------------ estimated creatinine clearance is 57.2 mL/min (by C-G formula based on Cr of 1.36). ------------------------------------------------------------------------------------------------------------------ No results for input(s): HGBA1C in the last 72 hours. ------------------------------------------------------------------------------------------------------------------ No results for input(s): CHOL, HDL, LDLCALC, TRIG, CHOLHDL, LDLDIRECT in the last 72 hours. ------------------------------------------------------------------------------------------------------------------ No results for input(s): TSH, T4TOTAL, T3FREE, THYROIDAB in the last 72 hours.  Invalid input(s): FREET3 ------------------------------------------------------------------------------------------------------------------ No results for input(s): VITAMINB12, FOLATE, FERRITIN, TIBC, IRON, RETICCTPCT in the last 72 hours.  Coagulation profile No results for input(s): INR, PROTIME in the  last 168 hours.  No results for input(s): DDIMER in the last 72 hours.  Cardiac Enzymes No results for input(s): CKMB, TROPONINI, MYOGLOBIN in the last 168 hours.  Invalid input(s): CK ------------------------------------------------------------------------------------------------------------------ Invalid input(s): POCBNP    Saliha Salts D.O. on 10/31/2014 at 1:21 PM  Between 7am to 7pm - Pager - [phone]  After 7pm go to [email or web address] - password [email or web address]  And look for the night coverage person covering for me after hours  [facility] Office  [phone]

## 2014-10-31 NOTE — Progress Notes (Signed)
Pt off unit to radiology, pt stable, no complaints or concerns stated

## 2014-10-31 NOTE — Progress Notes (Signed)
VASCULAR LAB PRELIMINARY  PRELIMINARY  PRELIMINARY  PRELIMINARY  Right lower extremity venous duplex completed.    Preliminary report:  Right:  No evidence of DVT, superficial thrombosis, or Baker's cyst.  Sanaia Jasso, RVS 10/31/2014, 1:35 PM

## 2014-10-31 NOTE — Progress Notes (Signed)
Pt back from radiology, pt stable

## 2014-10-31 NOTE — Progress Notes (Signed)
Inpatient Diabetes Program Recommendations  AACE/ADA: New Consensus Statement on Inpatient Glycemic Control (2013)  Target Ranges:  Prepandial:   less than 140 mg/dL      Peak postprandial:   less than 180 mg/dL (1-2 hours)      Critically ill patients:  140 - 180 mg/dL   Results for James Roberson, James Roberson (MRN [id number]) as of 10/31/2014 14:11  Ref. Range 10/30/2014 11:31 10/30/2014 17:05 10/30/2014 22:13 10/31/2014 08:17 10/31/2014 12:21  Glucose-Capillary Latest Range: 70-99 mg/dL 562233 (H) 130184 (H) 865180 (H) 172 (H) 187 (H)    Reason for elevated CBG  Current orders for Inpatient glycemic control: Levemir 16 units q day, Novolog correction 0-9 units tid with meals, Tradjenta 5mg  qday  Please consider increasing Levemir to 18 units q day (elevated fasting) and adding Novolog 3 units tid with meals (post prandial blood sugars elevated)  James RacerJulie Leinaala Catanese, RN, [place], [place], CDE Diabetes Coordinator Inpatient Diabetes Program  [phone] (Team Pager) [phone] Patrcia Dolly([place] Office) 10/31/2014 2:16 PM

## 2014-10-31 NOTE — Plan of Care (Signed)
Problem: Discharge Progression Outcomes Goal: Pain controlled with appropriate interventions Outcome: Completed/Met Date Met:  10/31/14 Pain medication given at request for any complaints of pain

## 2014-11-01 LAB — GLUCOSE, CAPILLARY
Glucose-Capillary: 208 mg/dL — ABNORMAL HIGH (ref 70–99)
Glucose-Capillary: 195 mg/dL — ABNORMAL HIGH (ref 70–99)
Glucose-Capillary: 203 mg/dL — ABNORMAL HIGH (ref 70–99)
Glucose-Capillary: 171 mg/dL — ABNORMAL HIGH (ref 70–99)

## 2014-11-01 MED ORDER — LUBRIDERM SERIOUSLY SENSITIVE EX LOTN
TOPICAL_LOTION | Freq: Two times a day (BID) | CUTANEOUS | Status: DC
  Administered 2014-11-01 – 2014-11-03 (×5): via TOPICAL
  Filled 2014-11-01: qty 562

## 2014-11-01 MED ORDER — INSULIN ASPART 100 UNIT/ML ~~LOC~~ SOLN
3.0000 [IU] | Freq: Three times a day (TID) | SUBCUTANEOUS | Status: DC
  Administered 2014-11-01 – 2014-11-03 (×7): 3 [IU] via SUBCUTANEOUS

## 2014-11-01 MED ORDER — INSULIN DETEMIR 100 UNIT/ML ~~LOC~~ SOLN
18.0000 [IU] | Freq: Every day | SUBCUTANEOUS | Status: DC
  Administered 2014-11-01 – 2014-11-02 (×2): 18 [IU] via SUBCUTANEOUS
  Filled 2014-11-01 (×2): qty 0.18

## 2014-11-01 NOTE — Progress Notes (Signed)
[facility]                                                                              Patient Demographics  James Roberson, is a 69 y.o. male, DOB - [birth year], JXB:[id number]  Admit date - 10/29/2014   Admitting Physician Eduard Clos, MD  Outpatient Primary MD for the patient is Cliffton Asters, MD  LOS - 2   Chief Complaint  Patient presents with  . Recurrent Skin Infections      HPI on 10/30/2014 by Dr. Midge Minium James Roberson is a 69 y.o. male with history of recurrent cellulitis, diabetes mellitus type 2, hypertension, hyperlipidemia, gout, chronic kidney disease presents to the ER because of worsening swelling and erythema of his right lower ex 20. Patient has noticed erythema and swelling on his distal right leg 3 days ago which has progressively worsened. Patient denies any fever chills. Patient was prescribed clindamycin and had taken it for last 24 hours. Despite being on clindamycin patient's erythema has progressed proximally to his thighs. Patient has been admitted for IV antibiotics for his cellulitis.  Assessment & Plan   Sepsis secondary to RLE cellulitis -Up on admission, patient was tachycardic with a lactic acid of 2, CRP of 9.9 -vital signs have normalized, lactic acid currently 1.4 -Erythema and swelling improving, small area of fluctuance on lateral calf  -Continue vancomycin and Zosyn -R Lower extremity Doppler: no DVT, superficial thrombosis or Baker's cyst -Patient has had several surgeries before for abscesses  -Will consult surgery   Diabetes Mellitus, type 2 -Metformin held -Continue Levemir, Januvia, insulin sliding scale a CBG monitoring -Will increase levemir to 18u and add novolog 3u with meals  Hypertension -Patient currently on valsartan/HCTZ at home -Currently held, blood pressure stable  Hyperlipidemia  -Continue statin and fenofibrate  History of gout -Continue allopurinol  Chronic kidney disease, stage  III -Creatinine  Improving, Currently 1.36  Code Status: DNR  Family Communication: None at bedside  Disposition Plan: Admitted, continue IV antibiotics, will obtain surgery consult  Time Spent in minutes   30 minutes  Procedures  None  Consults   Surgery  DVT Prophylaxis  Lovenox  Lab Results  Component Value Date   PLT 332 10/31/2014    Medications  Scheduled Meds: . acitretin  25 mg Oral QODAY  . allopurinol  100 mg Oral Daily  . atorvastatin  40 mg Oral Daily  . enoxaparin (LOVENOX) injection  40 mg Subcutaneous Daily  . fenofibrate  160 mg Oral Daily  . insulin aspart  0-9 Units Subcutaneous TID WC  . insulin aspart  3 Units Subcutaneous TID WC  . insulin detemir  18 Units Subcutaneous QHS  . linagliptin  5 mg Oral Daily  . piperacillin-tazobactam (ZOSYN)  IV  3.375 g Intravenous Q8H  . vancomycin  1,500 mg Intravenous Q24H   Continuous Infusions:   PRN Meds:.acetaminophen **OR** acetaminophen, HYDROcodone-acetaminophen, morphine injection, ondansetron **OR** ondansetron (ZOFRAN) IV, zolpidem  Antibiotics    Anti-infectives    Start     Dose/Rate Route Frequency Ordered Stop   10/31/14 0100  vancomycin (VANCOCIN) 1,500 mg in sodium chloride 0.9 % 500 mL IVPB  1,500 mg 250 mL/hr over 120 Minutes Intravenous Every 24 hours 10/30/14 0022     10/30/14 0200  piperacillin-tazobactam (ZOSYN) IVPB 3.375 g     3.375 g 12.5 mL/hr over 240 Minutes Intravenous Every 8 hours 10/30/14 0110     10/30/14 0115  piperacillin-tazobactam (ZOSYN) IVPB 3.375 g  Status:  Discontinued     3.375 g 100 mL/hr over 30 Minutes Intravenous  Once 10/30/14 0108 10/30/14 0109   10/30/14 0115  vancomycin (VANCOCIN) IVPB 1000 mg/200 mL premix  Status:  Discontinued     1,000 mg 200 mL/hr over 60 Minutes Intravenous  Once 10/30/14 0108 10/30/14 0110   10/30/14 0100  vancomycin (VANCOCIN) 1,500 mg in sodium chloride 0.9 % 500 mL IVPB     1,500 mg 250 mL/hr over 120 Minutes Intravenous   Once 10/30/14 0021 10/30/14 1610        Subjective:   James Roberson seen and examined today.  Patient feels his leg swelling and pain have improved but is worried he may have an abscess.  Denies chest pain, shortness of breath, abdominal pain.   Objective:   Filed Vitals:   10/31/14 0543 10/31/14 1341 10/31/14 2048 11/01/14 0536  BP: 132/79 130/66 144/75 129/57  Pulse: 80  89 82  Temp: 98.1 F (36.7 C) 98.1 F (36.7 C) 98.4 F (36.9 C) 98.6 F (37 C)  TempSrc: Oral Oral Oral Oral  Resp: Height:      Weight:      SpO2: 98% 98% 100% 96%    Wt Readings from Last 3 Encounters:  10/30/14 91.3 kg (201 lb 4.5 oz)  07/23/13 92.534 kg (204 lb)  10/21/08 88.043 kg (194 lb 1.6 oz)     Intake/Output Summary (Last 24 hours) at 11/01/14 1010 Last data filed at 11/01/14 0940  Gross per 24 hour  Intake    582 ml  Output    575 ml  Net      7 ml    Exam  General: Well developed, well nourished  HEENT: NCAT, mucous membranes moist.   Cardiovascular: S1 S2 auscultated,RRR, no murmurs  Respiratory: Clear to auscultation  Abdomen: Soft, nontender, nondistended, + bowel sounds  Extremities: warm dry without cyanosis clubbing. RLE edema and erythema- improving, area of erythema on right inner thigh. 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  Psych: Pleasant, Appropriate mood and affect  Data Review   Micro Results Recent Results (from the past 240 hour(s))  Blood culture (routine x 2)     Status: None (Preliminary result)   Collection Time: 10/30/14 12:30 AM  Result Value Ref Range Status   Specimen Description BLOOD RIGHT ARM  Final   Special Requests BOTTLES DRAWN AEROBIC AND ANAEROBIC 5 CC  Final   Culture   Final           BLOOD CULTURE RECEIVED NO GROWTH TO DATE CULTURE WILL BE HELD FOR 5 DAYS BEFORE ISSUING A FINAL NEGATIVE  REPORT Performed at [facility]    Report Status PENDING  Incomplete  Blood culture (routine x 2)     Status: None (Preliminary result)   Collection Time: 10/30/14 12:34 AM  Result Value Ref Range Status   Specimen Description BLOOD LEFT UPPER ARM  Final   Special Requests BOTTLES DRAWN AEROBIC AND ANAEROBIC 5 CC  Final   Culture  Final           BLOOD CULTURE RECEIVED NO GROWTH TO DATE CULTURE WILL BE HELD FOR 5 DAYS BEFORE ISSUING A FINAL NEGATIVE REPORT [facility]    Report Status PENDING  Incomplete    Radiology Reports No results found.  CBC  Recent Labs Lab 10/29/14 2317 10/30/14 0422 10/31/14 0942  WBC 9.9 8.6 8.0  HGB 13.3 12.4* 12.4*  HCT 39.7 37.3* 37.5*  PLT 289 284 332  MCV 83.8 82.5 83.0  MCH 28.1 27.4 27.4  MCHC 33.5 33.2 33.1  RDW 13.1 13.1 13.2  LYMPHSABS 1.8 1.6  --   MONOABS 0.7 0.7  --   EOSABS 0.1 0.1  --   BASOSABS 0.0 0.0  --     Chemistries   Recent Labs Lab 10/29/14 2317 10/30/14 0422 10/31/14 0942  NA 137 137 136  K 4.8 3.6 3.6  CL 103 103 99  CO2 23 26 27   GLUCOSE 169* 240* 234*  BUN 22 22 12   CREATININE 1.72* 1.57* 1.36*  CALCIUM 9.6 9.1 9.3  AST  --  33  --   ALT  --  46  --   ALKPHOS  --  89  --   BILITOT  --  0.5  --    ------------------------------------------------------------------------------------------------------------------ estimated creatinine clearance is 57.2 mL/min (by C-G formula based on Cr of 1.36). ------------------------------------------------------------------------------------------------------------------ No results for input(s): HGBA1C in the last 72 hours. ------------------------------------------------------------------------------------------------------------------ No results for input(s): CHOL, HDL, LDLCALC, TRIG, CHOLHDL, LDLDIRECT in the last 72  hours. ------------------------------------------------------------------------------------------------------------------ No results for input(s): TSH, T4TOTAL, T3FREE, THYROIDAB in the last 72 hours.  Invalid input(s): FREET3 ------------------------------------------------------------------------------------------------------------------ No results for input(s): VITAMINB12, FOLATE, FERRITIN, TIBC, IRON, RETICCTPCT in the last 72 hours.  Coagulation profile No results for input(s): INR, PROTIME in the last 168 hours.  No results for input(s): DDIMER in the last 72 hours.  Cardiac Enzymes No results for input(s): CKMB, TROPONINI, MYOGLOBIN in the last 168 hours.  Invalid input(s): CK ------------------------------------------------------------------------------------------------------------------ Invalid input(s): POCBNP    Toye Rouillard D.O. on 11/01/2014 at 10:10 AM  Between 7am to 7pm - Pager - [phone]  After 7pm go to [email or web address] - password [email or web address]  And look for the night coverage person covering for me after hours  [facility] Office  [phone]

## 2014-11-01 NOTE — Consult Note (Signed)
Reason for Consult: right leg and foot cellulitis Referring Physician: Ree Kida MD  ZURICH CARRENO is an 69 y.o. male.  HPI: recurrent problem with right LE cellulitis , in past years he has had I and D which took up to 2 years to close. On Vanc and Zosyn currently. Has palm and plantar psoriasis type condition.  Symptoms began 5 to 6 days ago.   Past Medical History  Diagnosis Date  . Diabetes mellitus without complication   . Hyperlipidemia   . Hypertension   . Obesity     Past Surgical History  Procedure Laterality Date  . Lumbar laminectomy  1989  . Back surgery    . Leg surgery      Family History  Problem Relation Age of Onset  . CAD Father     Social History:  reports that he quit smoking about 48 years ago. He has never used smokeless tobacco. He reports that he drinks alcohol. His drug history is not on file.  Allergies:  Allergies  Allergen Reactions  . Ambien Cr [Zolpidem Tartrate Er]     Has Hallucinations  . Ambien [Zolpidem]     Has hallucinations  . Avelox Abc [Moxifloxacin]   . Tape Rash    Medications: I have reviewed the patient's current medications.  Results for orders placed or performed during the hospital encounter of 10/29/14 (from the past 48 hour(s))  Glucose, capillary     Status: Abnormal   Collection Time: 10/30/14  5:05 PM  Result Value Ref Range   Glucose-Capillary 184 (H) 70 - 99 mg/dL  Glucose, capillary     Status: Abnormal   Collection Time: 10/30/14 10:13 PM  Result Value Ref Range   Glucose-Capillary 180 (H) 70 - 99 mg/dL   Comment 1 Notify RN    Comment 2 Document in Chart   Glucose, capillary     Status: Abnormal   Collection Time: 10/31/14  8:17 AM  Result Value Ref Range   Glucose-Capillary 172 (H) 70 - 99 mg/dL  CBC     Status: Abnormal   Collection Time: 10/31/14  9:42 AM  Result Value Ref Range   WBC 8.0 4.0 - 10.5 K/uL   RBC 4.52 4.22 - 5.81 MIL/uL   Hemoglobin 12.4 (L) 13.0 - 17.0 g/dL   HCT 37.5 (L) 39.0 -  52.0 %   MCV 83.0 78.0 - 100.0 fL   MCH 27.4 26.0 - 34.0 pg   MCHC 33.1 30.0 - 36.0 g/dL   RDW 13.2 11.5 - 15.5 %   Platelets 332 150 - 400 K/uL  Basic metabolic panel     Status: Abnormal   Collection Time: 10/31/14  9:42 AM  Result Value Ref Range   Sodium 136 135 - 145 mmol/L   Potassium 3.6 3.5 - 5.1 mmol/L   Chloride 99 96 - 112 mmol/L   CO2 27 19 - 32 mmol/L   Glucose, Bld 234 (H) 70 - 99 mg/dL   BUN 12 6 - 23 mg/dL   Creatinine, Ser 1.36 (H) 0.50 - 1.35 mg/dL   Calcium 9.3 8.4 - 10.5 mg/dL   GFR calc non Af Amer 52 (L) >90 mL/min   GFR calc Af Amer 60 (L) >90 mL/min    Comment: (NOTE) The eGFR has been calculated using the CKD EPI equation. This calculation has not been validated in all clinical situations. eGFR's persistently <90 mL/min signify possible Chronic Kidney Disease.    Anion gap 10 5 - 15  Glucose, capillary     Status: Abnormal   Collection Time: 10/31/14 12:21 PM  Result Value Ref Range   Glucose-Capillary 187 (H) 70 - 99 mg/dL  Glucose, capillary     Status: Abnormal   Collection Time: 10/31/14  5:22 PM  Result Value Ref Range   Glucose-Capillary 233 (H) 70 - 99 mg/dL  Glucose, capillary     Status: Abnormal   Collection Time: 10/31/14 11:00 PM  Result Value Ref Range   Glucose-Capillary 256 (H) 70 - 99 mg/dL   Comment 1 Notify RN    Comment 2 Document in Chart   Glucose, capillary     Status: Abnormal   Collection Time: 11/01/14  7:49 AM  Result Value Ref Range   Glucose-Capillary 208 (H) 70 - 99 mg/dL  Glucose, capillary     Status: Abnormal   Collection Time: 11/01/14 11:49 AM  Result Value Ref Range   Glucose-Capillary 195 (H) 70 - 99 mg/dL    No results found.  Review of Systems  Constitutional: Positive for fever. Negative for chills.  Cardiovascular: Negative for chest pain and orthopnea.  Genitourinary: Negative for hematuria.  Neurological: Positive for sensory change.  Endo/Heme/Allergies:       Diabetes   Blood pressure  129/57, pulse 82, temperature 98.6 F (37 C), temperature source Oral, resp. rate 18, height '5\' 9"'  (1.753 m), weight 91.3 kg (201 lb 4.5 oz), SpO2 96 %. Physical Exam  Constitutional: He appears well-developed and well-nourished.  HENT:  Head: Normocephalic.  Neck: Neck supple.  Cardiovascular: Normal rate.   Respiratory: Effort normal.  GI: Soft.  Musculoskeletal:  Right LE edema , cellulitis, ( resolving with tx ) multiple scars from previous drainage.     Assessment/Plan:   Venous stasis disease with foot psoriasis.  When leg edema gets worse with swelling , cellulitis likely entry site may be psoriatic foot and ankle.     Plan ELEVATION, lotion to legs. Cont IV ABX.  Would not want to make incision since it will takes months/years to heal.   Needs lotion , elevation and TEDS once swelling goes down.  Thanks will follow    [phone]  Marybelle Killings 11/01/2014, 12:02 PM

## 2014-11-02 LAB — BASIC METABOLIC PANEL
Anion gap: 9 (ref 5–15)
BUN: 15 mg/dL (ref 6–23)
CALCIUM: 9.2 mg/dL (ref 8.4–10.5)
CHLORIDE: 99 mmol/L (ref 96–112)
CO2: 25 mmol/L (ref 19–32)
Creatinine, Ser: 1.6 mg/dL — ABNORMAL HIGH (ref 0.50–1.35)
GFR calc Af Amer: 49 mL/min — ABNORMAL LOW (ref >90)
GFR, EST NON AFRICAN AMERICAN: 42 mL/min — AB (ref >90)
GLUCOSE: 266 mg/dL — AB (ref 70–99)
POTASSIUM: 3.9 mmol/L (ref 3.5–5.1)
SODIUM: 133 mmol/L — AB (ref 135–145)

## 2014-11-02 LAB — CBC
HEMATOCRIT: 34.9 % — AB (ref 39.0–52.0)
Hemoglobin: 11.6 g/dL — ABNORMAL LOW (ref 13.0–17.0)
MCH: 27.6 pg (ref 26.0–34.0)
MCHC: 33.2 g/dL (ref 30.0–36.0)
MCV: 82.9 fL (ref 78.0–100.0)
PLATELETS: 424 10*3/uL — AB (ref 150–400)
RBC: 4.21 MIL/uL — AB (ref 4.22–5.81)
RDW: 13.1 % (ref 11.5–15.5)
WBC: 8 10*3/uL (ref 4.0–10.5)

## 2014-11-02 LAB — GLUCOSE, CAPILLARY
GLUCOSE-CAPILLARY: 215 mg/dL — AB (ref 70–99)
GLUCOSE-CAPILLARY: 191 mg/dL — AB (ref 70–99)
Glucose-Capillary: 181 mg/dL — ABNORMAL HIGH (ref 70–99)
Glucose-Capillary: 166 mg/dL — ABNORMAL HIGH (ref 70–99)

## 2014-11-02 LAB — VANCOMYCIN, TROUGH: Vancomycin Tr: 10.8 ug/mL (ref 10.0–20.0)

## 2014-11-02 MED ORDER — KETOROLAC TROMETHAMINE 30 MG/ML IJ SOLN
30.0000 mg | Freq: Once | INTRAMUSCULAR | Status: AC
  Administered 2014-11-02: 30 mg via INTRAVENOUS
  Filled 2014-11-02: qty 1

## 2014-11-02 MED ORDER — HYDROCHLOROTHIAZIDE 25 MG PO TABS
25.0000 mg | ORAL_TABLET | Freq: Every day | ORAL | Status: DC
  Administered 2014-11-02 – 2014-11-03 (×2): 25 mg via ORAL
  Filled 2014-11-02 (×2): qty 1

## 2014-11-02 MED ORDER — VALSARTAN-HYDROCHLOROTHIAZIDE 320-25 MG PO TABS: 1.0000 | ORAL_TABLET | Freq: Every day | ORAL | Status: DC

## 2014-11-02 MED ORDER — IRBESARTAN 300 MG PO TABS
300.0000 mg | ORAL_TABLET | Freq: Every day | ORAL | Status: DC
  Administered 2014-11-02 – 2014-11-03 (×2): 300 mg via ORAL
  Filled 2014-11-02 (×2): qty 1

## 2014-11-02 NOTE — Progress Notes (Signed)
ANTIBIOTIC CONSULT NOTE - FOLLOW UP  Pharmacy Consult for vancomycin Indication: cellulitis   Labs:  Recent Labs  10/30/14 0422 10/31/14 0942 11/02/14 0049  WBC 8.6 8.0  --   HGB 12.4* 12.4*  --   PLT 284 332  --   CREATININE 1.57* 1.36* 1.60*   Estimated Creatinine Clearance: 48.6 mL/min (by C-G formula based on Cr of 1.6).  Recent Labs  11/02/14 0049  VANCOTROUGH 10.8     Microbiology: Recent Results (from the past 720 hour(s))  Blood culture (routine x 2)     Status: None (Preliminary result)   Collection Time: 10/30/14 12:30 AM  Result Value Ref Range Status   Specimen Description BLOOD RIGHT ARM  Final   Special Requests BOTTLES DRAWN AEROBIC AND ANAEROBIC 5 CC  Final   Culture   Final           BLOOD CULTURE RECEIVED NO GROWTH TO DATE CULTURE WILL BE HELD FOR 5 DAYS BEFORE ISSUING A FINAL NEGATIVE REPORT [facility]    Report Status PENDING  Incomplete  Blood culture (routine x 2)     Status: None (Preliminary result)   Collection Time: 10/30/14 12:34 AM  Result Value Ref Range Status   Specimen Description BLOOD LEFT UPPER ARM  Final   Special Requests BOTTLES DRAWN AEROBIC AND ANAEROBIC 5 CC  Final   Culture   Final           BLOOD CULTURE RECEIVED NO GROWTH TO DATE CULTURE WILL BE HELD FOR 5 DAYS BEFORE ISSUING A FINAL NEGATIVE REPORT [facility]formed at [facility]    Report Status PENDING  Incomplete     Assessment: 69yo male therapeutic on vancomycin with initial dosing for cellulitis.  Goal of Therapy:  Vancomycin trough level 10-15 mcg/ml  Plan:  Will continue vancomycin and monitor.  Vernard GamblesVeronda Onda Kattner, PharmD, BCPS  11/02/2014,1:52 AM

## 2014-11-02 NOTE — Progress Notes (Signed)
Subjective:     Patient reports pain as mild.    Objective: Vital signs in last 24 hours: Temp:  [98.2 F (36.8 C)-98.8 F (37.1 C)] 98.3 F (36.8 C) (03/27 0726) Pulse Rate:  [84] 84 (03/26 1340) Resp:  [16-20] 20 (03/27 0726) BP: (143-157)/(69-73) 157/73 mmHg (03/27 0726) SpO2:  [92 %-98 %] 97 % (03/27 0726)  Intake/Output from previous day: 03/26 0701 - 03/27 0700 In: 822 [P.O.:822] Out: 2050 [Urine:2050] Intake/Output this shift: Total I/O In: -  Out: 200 [Urine:200]   Recent Labs  10/31/14 0942 11/02/14 0049  HGB 12.4* 11.6*    Recent Labs  10/31/14 0942 11/02/14 0049  WBC 8.0 8.0  RBC 4.52 4.21*  HCT 37.5* 34.9*  PLT 332 424*    Recent Labs  10/31/14 0942 11/02/14 0049  NA 136 133*  K 3.6 3.9  CL 99 99  CO2 27 25  BUN 12 15  CREATININE 1.36* 1.60*  GLUCOSE 234* 266*  CALCIUM 9.3 9.2   No results for input(s): LABPT, INR in the last 72 hours.  swelling down with elevation. still warm in area of cellulitis. PO ABX and office follow up 1 to 2 wks.   he needs to use his support stockings when he gets home and still keep leg elevated. Discussed with patient in detail.   Assessment/Plan:     Up with therapy  Home on PO ABX when ready per medical team. Office followup with me Friday afternoon.   Adarian Bur C 11/02/2014, 10:45 AM

## 2014-11-02 NOTE — Progress Notes (Signed)
[facility]                                                                              Patient Demographics  James Roberson, is a 69 y.o. male, DOB - 1945/08/21, WUJ:[id number]  Admit date - 10/29/2014   Admitting Physician Eduard Clos, MD  Outpatient Primary MD for the patient is Cliffton Asters, MD  LOS - 3   Chief Complaint  Patient presents with  . Recurrent Skin Infections      HPI on 10/30/2014 by Dr. Midge Minium James Roberson is a 69 y.o. male with history of recurrent cellulitis, diabetes mellitus type 2, hypertension, hyperlipidemia, gout, chronic kidney disease presents to the ER because of worsening swelling and erythema of his right lower ex 20. Patient has noticed erythema and swelling on his distal right leg 3 days ago which has progressively worsened. Patient denies any fever chills. Patient was prescribed clindamycin and had taken it for last 24 hours. Despite being on clindamycin patient's erythema has progressed proximally to his thighs. Patient has been admitted for IV antibiotics for his cellulitis.  Assessment & Plan   Sepsis secondary to RLE cellulitis -Up on admission, patient was tachycardic with a lactic acid of 2, CRP of 9.9 -vital signs have normalized, lactic acid currently 1.4 -Erythema and swelling improving, small area of fluctuance on lateral calf- resolved, however new area on RLE- continue to monitor  -Continue vancomycin and Zosyn, pain control -R Lower extremity Doppler: no DVT, superficial thrombosis or Baker's cyst -Patient has had several surgeries before for abscesses  -Orthopedic surgery consulted and appreciated, no surgical intervention, outpatient follow up with Dr. Ophelia Charter on Friday, April 1  Diabetes Mellitus, type 2 -Metformin held -Continue Levemir, Januvia, insulin sliding scale a CBG monitoring  Hypertension -Patient currently on valsartan/HCTZ at home -Will restart home meds  Hyperlipidemia  -Continue  statin and fenofibrate  History of gout -Continue allopurinol  Chronic kidney disease, stage III -Creatinine  Improving, Currently 1.60  Code Status: DNR  Family Communication: None at bedside  Disposition Plan: Admitted, continue IV antibiotics for one more day, likely discharge 3/28.  Time Spent in minutes   30 minutes  Procedures  Right Lower extremity doppler  Consults   Orthopedic Surgery  DVT Prophylaxis  Lovenox  Lab Results  Component Value Date   PLT 424* 11/02/2014    Medications  Scheduled Meds: . acitretin  25 mg Oral QODAY  . allopurinol  100 mg Oral Daily  . atorvastatin  40 mg Oral Daily  . enoxaparin (LOVENOX) injection  40 mg Subcutaneous Daily  . fenofibrate  160 mg Oral Daily  . insulin aspart  0-9 Units Subcutaneous TID WC  . insulin aspart  3 Units Subcutaneous TID WC  . insulin detemir  18 Units Subcutaneous QHS  . ketorolac  30 mg Intravenous Once  . linagliptin  5 mg Oral Daily  . lubriderm seriously sensitive   Topical BID  . piperacillin-tazobactam (ZOSYN)  IV  3.375 g Intravenous Q8H  . vancomycin  1,500 mg Intravenous Q24H   Continuous Infusions:   PRN Meds:.acetaminophen **OR** acetaminophen, HYDROcodone-acetaminophen, morphine injection, ondansetron **OR** ondansetron (ZOFRAN) IV, zolpidem  Antibiotics  Anti-infectives    Start     Dose/Rate Route Frequency Ordered Stop   10/31/14 0100  vancomycin (VANCOCIN) 1,500 mg in sodium chloride 0.9 % 500 mL IVPB     1,500 mg 250 mL/hr over 120 Minutes Intravenous Every 24 hours 10/30/14 0022     10/30/14 0200  piperacillin-tazobactam (ZOSYN) IVPB 3.375 g     3.375 g 12.5 mL/hr over 240 Minutes Intravenous Every 8 hours 10/30/14 0110     10/30/14 0115  piperacillin-tazobactam (ZOSYN) IVPB 3.375 g  Status:  Discontinued     3.375 g 100 mL/hr over 30 Minutes Intravenous  Once 10/30/14 0108 10/30/14 0109   10/30/14 0115  vancomycin (VANCOCIN) IVPB 1000 mg/200 mL premix  Status:   Discontinued     1,000 mg 200 mL/hr over 60 Minutes Intravenous  Once 10/30/14 0108 10/30/14 0110   10/30/14 0100  vancomycin (VANCOCIN) 1,500 mg in sodium chloride 0.9 % 500 mL IVPB     1,500 mg 250 mL/hr over 120 Minutes Intravenous  Once 10/30/14 0021 10/30/14 1610        Subjective:   James Roberson seen and examined today.  Patient feels his leg swelling and pain have improved however continues to complain of pain.  States there is another area on RLE that he is worried about.    Objective:   Filed Vitals:   11/01/14 0536 11/01/14 1340 11/01/14 2122 11/02/14 0726  BP: 129/57 146/69 143/69 157/73  Pulse: 82 84    Temp: 98.6 F (37 C) 98.8 F (37.1 C) 98.2 F (36.8 C) 98.3 F (36.8 C)  TempSrc: Oral Oral Oral Oral  Resp: Height:      Weight:      SpO2: 96% 92% 98% 97%    Wt Readings from Last 3 Encounters:  10/30/14 91.3 kg (201 lb 4.5 oz)  07/23/13 92.534 kg (204 lb)  10/21/08 88.043 kg (194 lb 1.6 oz)     Intake/Output Summary (Last 24 hours) at 11/02/14 1120 Last data filed at 11/02/14 9604  Gross per 24 hour  Intake    240 ml  Output   2050 ml  Net  -1810 ml    Exam  General: Well developed, well nourished  Cardiovascular: S1 S2 auscultated,RRR, no murmurs  Respiratory: Clear to auscultation  Abdomen: Soft, nontender, nondistended, + bowel sounds  Extremities: warm dry without cyanosis clubbing. RLE edema and erythema- improving, area of erythema on right inner thigh. Small area of fluctuance on lateral R calf - resolved, however new area near ankle   Data Review   Micro Results Recent Results (from the past 240 hour(s))  Blood culture (routine x 2)     Status: None (Preliminary result)   Collection Time: 10/30/14 12:30 AM  Result Value Ref Range Status   Specimen Description BLOOD RIGHT ARM  Final   Special Requests BOTTLES DRAWN AEROBIC AND ANAEROBIC 5 CC  Final   Culture   Final           BLOOD CULTURE RECEIVED NO GROWTH TO  DATE CULTURE WILL BE HELD FOR 5 DAYS BEFORE ISSUING A FINAL NEGATIVE REPORT Performed at [facility]    Report Status PENDING  Incomplete  Blood culture (routine x 2)     Status: None (Preliminary result)   Collection Time: 10/30/14 12:34 AM  Result Value Ref Range Status   Specimen Description BLOOD LEFT UPPER ARM  Final   Special Requests BOTTLES DRAWN AEROBIC AND ANAEROBIC  5 CC  Final   Culture   Final           BLOOD CULTURE RECEIVED NO GROWTH TO DATE CULTURE WILL BE HELD FOR 5 DAYS BEFORE ISSUING A FINAL NEGATIVE REPORT Performed at [facility]    Report Status PENDING  Incomplete    Radiology Reports No results found.  CBC  Recent Labs Lab 10/29/14 2317 10/30/14 0422 10/31/14 0942 11/02/14 0049  WBC 9.9 8.6 8.0 8.0  HGB 13.3 12.4* 12.4* 11.6*  HCT 39.7 37.3* 37.5* 34.9*  PLT 289 284 332 424*  MCV 83.8 82.5 83.0 82.9  MCH 28.1 27.4 27.4 27.6  MCHC 33.5 33.2 33.1 33.2  RDW 13.1 13.1 13.2 13.1  LYMPHSABS 1.8 1.6  --   --   MONOABS 0.7 0.7  --   --   EOSABS 0.1 0.1  --   --   BASOSABS 0.0 0.0  --   --     Chemistries   Recent Labs Lab 10/29/14 2317 10/30/14 0422 10/31/14 0942 11/02/14 0049  NA 137 137 136 133*  K 4.8 3.6 3.6 3.9  CL 103 103 99 99  CO2 23 26 27 25   GLUCOSE 169* 240* 234* 266*  BUN 22 22 12 15   CREATININE 1.72* 1.57* 1.36* 1.60*  CALCIUM 9.6 9.1 9.3 9.2  AST  --  33  --   --   ALT  --  46  --   --   ALKPHOS  --  89  --   --   BILITOT  --  0.5  --   --    ------------------------------------------------------------------------------------------------------------------ estimated creatinine clearance is 48.6 mL/min (by C-G formula based on Cr of 1.6). ------------------------------------------------------------------------------------------------------------------ No results for input(s): HGBA1C in the last 72  hours. ------------------------------------------------------------------------------------------------------------------ No results for input(s): CHOL, HDL, LDLCALC, TRIG, CHOLHDL, LDLDIRECT in the last 72 hours. ------------------------------------------------------------------------------------------------------------------ No results for input(s): TSH, T4TOTAL, T3FREE, THYROIDAB in the last 72 hours.  Invalid input(s): FREET3 ------------------------------------------------------------------------------------------------------------------ No results for input(s): VITAMINB12, FOLATE, FERRITIN, TIBC, IRON, RETICCTPCT in the last 72 hours.  Coagulation profile No results for input(s): INR, PROTIME in the last 168 hours.  No results for input(s): DDIMER in the last 72 hours.  Cardiac Enzymes No results for input(s): CKMB, TROPONINI, MYOGLOBIN in the last 168 hours.  Invalid input(s): CK ------------------------------------------------------------------------------------------------------------------ Invalid input(s): POCBNP    James Roberson D.O. on 11/02/2014 at 11:20 AM  Between 7am to 7pm - Pager - [phone]  After 7pm go to [email or web address] - password [email or web address]  And look for the night coverage person covering for me after hours  [facility] Office  [phone]

## 2014-11-03 LAB — GLUCOSE, CAPILLARY
Glucose-Capillary: 242 mg/dL — ABNORMAL HIGH (ref 70–99)
Glucose-Capillary: 172 mg/dL — ABNORMAL HIGH (ref 70–99)

## 2014-11-03 MED ORDER — INSULIN DETEMIR 100 UNIT/ML ~~LOC~~ SOLN: 18.0000 [IU] | Freq: Every day | SUBCUTANEOUS | Status: DC

## 2014-11-03 MED ORDER — LUBRIDERM SERIOUSLY SENSITIVE EX LOTN: 1.0000 "application " | TOPICAL_LOTION | Freq: Two times a day (BID) | CUTANEOUS | Status: DC

## 2014-11-03 MED ORDER — SACCHAROMYCES BOULARDII 250 MG PO CAPS: 250.0000 mg | ORAL_CAPSULE | Freq: Two times a day (BID) | ORAL | Status: DC

## 2014-11-03 MED ORDER — CLINDAMYCIN HCL 150 MG PO CAPS: 450.0000 mg | ORAL_CAPSULE | Freq: Three times a day (TID) | ORAL | Status: DC

## 2014-11-03 NOTE — Discharge Summary (Signed)
Physician Discharge Summary  James Roberson:[id number] DOB: 07-26-1946 DOA: 10/29/2014  PCP: Cliffton Asters, MD  Admit date: 10/29/2014 Discharge date: 11/03/2014  Time spent: 45 minutes  Recommendations for Outpatient Follow-up:  Patient will be discharged home. He should follow-up with his primary care physician within one week of discharge. Patient also follow-up with Dr. Annell Greening, orthopedic surgeon on 11/07/2014. Patient to continue his medications as prescribed. Follow a  heart healthy/carb modified diet. Patient may resume activity as tolerated.  Discharge Diagnoses:  Sinuses and right lower extremity cellulitis Diabetes mellitus, type II Hypertension Hyperlipidemia History of gout Chronic kidney disease, stage III  Discharge Condition: Stable  Diet recommendation:  heart healthy/carb modified diet  Filed Weights   10/29/14 2330 10/30/14 0107  Weight: 90.719 kg (200 lb) 91.3 kg (201 lb 4.5 oz)    History of present illness:  on 10/30/2014 by Dr. Midge Minium James Roberson is a 69 y.o. male with history of recurrent cellulitis, diabetes mellitus type 2, hypertension, hyperlipidemia, gout, chronic kidney disease presents to the ER because of worsening swelling and erythema of his right lower ex 20. Patient has noticed erythema and swelling on his distal right leg 3 days ago which has progressively worsened. Patient denies any fever chills. Patient was prescribed clindamycin and had taken it for last 24 hours. Despite being on clindamycin patient's erythema has progressed proximally to his thighs. Patient has been admitted for IV antibiotics for his cellulitis.  Hospital Course:  Sepsis secondary to RLE cellulitis -Up on admission, patient was tachycardic with a lactic acid of 2, CRP of 9.9 -vital signs have normalized, lactic acid 1.4 -Erythema and swelling improving, small area of fluctuance on lateral calf- resolved, however new area on RLE- continue to monitor   -Initially placed on vancomycin and Zosyn, pain control -R Lower extremity Doppler: no DVT, superficial thrombosis or Baker's cyst -Patient has had several surgeries before for abscesses  -Orthopedic surgery consulted and appreciated, no surgical intervention, outpatient follow up with Dr. Ophelia Charter on Friday, April 1 -Will discharge patient with clindamycin by mouth, as well as probiotics. -Patient does have allergy to Avelox, cannot use Bactrim due to kidney dysfunction, and states clindamycin has worked in the past.  Diabetes Mellitus, type 2 -Metformin held, however may resume once with his primary care physician -Continue home medications  Hypertension -Patient currently on valsartan/HCTZ at home  Hyperlipidemia  -Continue statin and fenofibrate  History of gout -Continue allopurinol  Chronic kidney disease, stage III -Creatinine Improving, Currently 1.60  Code Status: DNR  Procedures  Right Lower extremity doppler  Consults  Orthopedic Surgery  Discharge Exam: Filed Vitals:   11/03/14 0516  BP: 123/61  Pulse: 76  Temp: 98.5 F (36.9 C)  Resp: 18   Exam  General: Well developed, well nourished  Cardiovascular: S1 S2 auscultated,RRR, no murmurs  Respiratory: Clear to auscultation  Abdomen: Soft, nontender, nondistended, + bowel sounds  Extremities: warm dry without cyanosis clubbing. RLE edema and erythema- improving  Neurological: AAOx3, nonfocal  Psych: Pleasant, appropriate mood and affect  Discharge Instructions      Discharge Instructions    Discharge instructions    Complete by:  As directed   Patient will be discharged home. He should follow-up with his primary care physician within one week of discharge. Patient also follow-up with Dr. Annell Greening, orthopedic surgeon on 11/07/2014. Patient to continue his medications as prescribed. Follow a  heart healthy/carb modified diet. Patient may resume activity as tolerated.  Medication List    STOP taking these medications        insulin aspart 100 UNIT/ML injection  Commonly known as:  novoLOG      TAKE these medications        allopurinol 100 MG tablet  Commonly known as:  ZYLOPRIM  Take 100 mg by mouth daily.     atorvastatin 40 MG tablet  Commonly known as:  LIPITOR  Take 40 mg by mouth daily.     clindamycin 150 MG capsule  Commonly known as:  CLEOCIN  Take 3 capsules (450 mg total) by mouth 3 (three) times daily. Started medication on 10-28-14. Patient unsure of dose. Please call pharmacy in AM if patient is admitted     insulin detemir 100 UNIT/ML injection  Commonly known as:  LEVEMIR  Inject 0.18 mLs (18 Units total) into the skin at bedtime.     lubriderm seriously sensitive Lotn  Apply 1 application topically 2 (two) times daily.     saccharomyces boulardii 250 MG capsule  Commonly known as:  FLORASTOR  Take 1 capsule (250 mg total) by mouth 2 (two) times daily.     sitaGLIPtin 100 MG tablet  Commonly known as:  JANUVIA  Take 100 mg by mouth daily.     SORIATANE 25 MG capsule  Generic drug:  acitretin  Take 25 mg by mouth every other day.     TRILIPIX 135 MG capsule  Generic drug:  Choline Fenofibrate  Take 135 mg by mouth every other day.     valsartan-hydrochlorothiazide 320-25 MG per tablet  Commonly known as:  DIOVAN-HCT  Take 1 tablet by mouth daily.     WELLBUTRIN PO  Take 1 tablet by mouth daily. Patient unsure of dose. Please call pharmacy in AM if patient is admitted     zolpidem 10 MG tablet  Commonly known as:  AMBIEN  Take 10 mg by mouth at bedtime as needed for sleep.       Allergies  Allergen Reactions  . Ambien Cr [Zolpidem Tartrate Er]     Has Hallucinations  . Ambien [Zolpidem]     Has hallucinations  . Avelox Abc [Moxifloxacin]   . Tape Rash   Follow-up Information    Follow up with Eldred MangesYATES,MARK C, MD On 11/07/2014.   Specialty:  Orthopedic Surgery   Why:  Hospital follow up   Contact  information:   [address] [place] [address] [phone]       Follow up with Cliffton AstersJohn Campbell, MD. Schedule an appointment as soon as possible for a visit in 1 week.   Specialty:  Infectious Diseases   Why:  Hospital follow up   Contact information:   [address] [place] [address] [phone]        The results of significant diagnostics from this hospitalization (including imaging, microbiology, ancillary and laboratory) are listed below for reference.    Significant Diagnostic Studies: No results found.  Microbiology: Recent Results (from the past 240 hour(s))  Blood culture (routine x 2)     Status: None (Preliminary result)   Collection Time: 10/30/14 12:30 AM  Result Value Ref Range Status   Specimen Description BLOOD RIGHT ARM  Final   Special Requests BOTTLES DRAWN AEROBIC AND ANAEROBIC 5 CC  Final   Culture   Final           BLOOD CULTURE RECEIVED NO GROWTH TO DATE CULTURE WILL BE HELD FOR 5 DAYS BEFORE  ISSUING A FINAL NEGATIVE REPORT Performed at [facility]    Report Status PENDING  Incomplete  Blood culture (routine x 2)     Status: None (Preliminary result)   Collection Time: 10/30/14 12:34 AM  Result Value Ref Range Status   Specimen Description BLOOD LEFT UPPER ARM  Final   Special Requests BOTTLES DRAWN AEROBIC AND ANAEROBIC 5 CC  Final   Culture   Final           BLOOD CULTURE RECEIVED NO GROWTH TO DATE CULTURE WILL BE HELD FOR 5 DAYS BEFORE ISSUING A FINAL NEGATIVE REPORT Performed at [facility]    Report Status PENDING  Incomplete     Labs: Basic Metabolic Panel:  Recent Labs Lab 10/29/14 2317 10/30/14 0422 10/31/14 0942 11/02/14 0049  NA 137 137 136 133*  K 4.8 3.6 3.6 3.9  CL 103 103 99 99  CO2 GLUCOSE 169* 240* 234* 266*  BUN CREATININE 1.72* 1.57* 1.36* 1.60*  CALCIUM 9.6 9.1 9.3 9.2   Liver Function Tests:  Recent Labs Lab 10/30/14 0422  AST 33    ALT 46  ALKPHOS 89  BILITOT 0.5  PROT 6.4  ALBUMIN 2.8*   No results for input(s): LIPASE, AMYLASE in the last 168 hours. No results for input(s): AMMONIA in the last 168 hours. CBC:  Recent Labs Lab 10/29/14 2317 10/30/14 0422 10/31/14 0942 11/02/14 0049  WBC 9.9 8.6 8.0 8.0  NEUTROABS 7.2 6.2  --   --   HGB 13.3 12.4* 12.4* 11.6*  HCT 39.7 37.3* 37.5* 34.9*  MCV 83.8 82.5 83.0 82.9  PLT 289 284 332 424*   Cardiac Enzymes: No results for input(s): CKTOTAL, CKMB, CKMBINDEX, TROPONINI in the last 168 hours. BNP: BNP (last 3 results) No results for input(s): BNP in the last 8760 hours.  ProBNP (last 3 results) No results for input(s): PROBNP in the last 8760 hours.  CBG:  Recent Labs Lab 11/02/14 0804 11/02/14 1226 11/02/14 1655 11/02/14 2204 11/03/14 0807  GLUCAP 215* 181* 191* 166* 242*       Signed:  Zayin Valadez  [facility] 11/03/2014, 10:09 AM

## 2014-11-03 NOTE — Discharge Instructions (Signed)

## 2014-11-03 NOTE — Progress Notes (Signed)
James Roberson to be D/C'd Home per MD order.  Discussed with the patient and all questions fully answered.  VSS, Skin clean, dry and intact without evidence of skin break down, no evidence of skin tears noted. IV catheter discontinued intact. Site without signs and symptoms of complications. Dressing and pressure applied.  An After Visit Summary was printed and given to the patient. Patient received prescription.  D/c education completed with patient/family including follow up instructions, medication list, d/c activities limitations if indicated, with other d/c instructions as indicated by MD - patient able to verbalize understanding, all questions fully answered.   Patient instructed to return to ED, call 911, or call MD for any changes in condition.   Patient escorted via WC, and D/C home via private auto.  Beckey DowningFlores, [name] 11/03/2014 2:58 PM

## 2014-11-03 NOTE — Patient Instructions (Signed)
Achilles / Soleus, Standing   Stand, right foot behind, heel on floor and turned slightly out. Lower hips and bend knees. Hold ___ seconds. Repeat ___ times per session. Do ___ sessions per day.  Copyright  VHI. All rights reserved.  Achilles / Gastroc, Standing   Stand, right foot behind, heel on floor and turned slightly out, leg straight, forward leg bent. Move hips forward. Hold ___ seconds. Repeat ___ times per session. Do ___ sessions per day.  Copyright  VHI. All rights reserved.  Ankle Dorsiflexion, Self-Mobilization, Sitting   Sit with feet flat. Slide one foot back until gentle stretch is felt. Keep entire foot on floor. Hold ___ seconds. Repeat ___ times per session. Do ___ sessions per day.  Copyright  VHI. All rights reserved.  Ankle Dorsiflexion, Self-Mobilization, Standing   Stand, one foot on step. Lean forward. Hold ___ seconds.  Repeat ___ times per session. Do ___ sessions per day.  Copyright  VHI. All rights reserved.  Achilles Squat   Stand, feet hip-width apart, slightly turned out. Squat as low as possible, keeping feet flat on floor. Feel stretch in heel. If possible, place elbows inside knees, palms together. Hold ___ seconds. Beginner: Grasp a table leg while squatting to hold pose. Advanced: Feet close together, wrap arms around shins. Repeat ___ times per session. Do ___ sessions per day.  Copyright  VHI. All rights reserved.  Gastroc, Sitting (Passive)   Sit with strap or towel around ball of foot. Gently pull toward body. Hold ___ seconds.  Repeat ___ times per session. Do ___ sessions per day.  Copyright  VHI. All rights reserved.  Supine: Leg Stretch With Strap (Basic)   Lie on back with one knee bent, foot flat on floor. Hook strap around other foot. Straighten knee. Keep knee level with other knee. Hold ___ seconds. Relax leg completely down to floor.  Repeat ___ times per session. Do ___ sessions per day.  Copyright  VHI. All  rights reserved.

## 2014-11-03 NOTE — Progress Notes (Signed)
CARE MANAGEMENT NOTE 11/03/2014  Patient:  James Roberson,James Roberson   Account Number:  [id number]  Date Initiated:  10/30/2014  Documentation initiated by:  Letha CapeAYLOR,DEBORAH  Subjective/Objective Assessment:   dx l foot cellulitis  admit- from home with spouse.     Action/Plan:   PT eval- no follow up needs   Anticipated DC Date:  11/03/2014   Anticipated DC Plan:  HOME/SELF CARE      DC Planning Services  CM consult           Status of service:  Completed, signed off Medicare Important Message given?  YES (If response is "NO", the following Medicare IM given date fields will be blank) Date Medicare IM given:  11/03/2014 Medicare IM given by:  [facility] Riendeau  Discharge Disposition:  HOME/SELF CARE  Per UR Regulation:  Reviewed for med. necessity/level of care/duration of stay   Comments:  10/30/14 1811 Letha Capeeborah Taylor RN, BSN [phone] NCM will cont to follow for dc needs.

## 2014-11-03 NOTE — Evaluation (Signed)
Physical Therapy Evaluation Patient Details Name: James Roberson MRN: [id number] DOB: 1946-02-22 Today's Date: 11/03/2014   History of Present Illness  [name] is a 69 y.o. male with history of recurrent cellulitis, diabetes mellitus type 2, hypertension, hyperlipidemia, gout, chronic kidney disease presents to the ER because of worsening swelling and erythema of his right lower ex   Clinical Impression  Pt demonstrated adequate safety mobilizing/ambulating given that R ankle stays in some degree of plantarflexion with foot only stretching into neutral df with heel on the floor after being up on his feet awhile.  Gave pt handout for gastroc/soleus stretching.  No further PT needs.  Will sign off.    Follow Up Recommendations No PT follow up    Equipment Recommendations  None recommended by PT    Recommendations for Other Services       Precautions / Restrictions Restrictions Weight Bearing Restrictions: No      Mobility  Bed Mobility                  Transfers Overall transfer level: Modified independent Equipment used:  (iv pole in place of cane)                Ambulation/Gait Ambulation/Gait assistance: Supervision Ambulation Distance (Feet): 250 Feet Assistive device:  (iv pole) Gait Pattern/deviations: Step-through pattern Gait velocity: slower   General Gait Details: foot plantarflexed on R most on the gait trial, but slowly worked down to heel touching at neutral df.  Stairs            Wheelchair Mobility    Modified Rankin (Stroke Patients Only)       Balance Overall balance assessment: No apparent balance deficits (not formally assessed)                                           Pertinent Vitals/Pain Pain Assessment: Faces Faces Pain Scale: Hurts little more Pain Location: R calf Pain Descriptors / Indicators: Aching Pain Intervention(s): Monitored during session    Home Living Family/patient  expects to be discharged to:: Private residence Living Arrangements: Spouse/significant other Available Help at Discharge: Family Type of Home: House Home Access: Stairs to enter     Home Layout: One level Home Equipment: Environmental consultant - 2 wheels;Cane - single point Additional Comments: generally uses cane during periods of cellulitis when foot draws into plantarflexion    Prior Function Level of Independence: Independent with assistive device(s)               Hand Dominance        Extremity/Trunk Assessment               Lower Extremity Assessment: RLE deficits/detail;Overall WFL for tasks assessed RLE Deficits / Details: grossly functional, but foot held in pf,  foot just beginning to reach neurtal df after ambulating 250 feet.       Communication   Communication: No difficulties  Cognition Arousal/Alertness: Awake/alert Behavior During Therapy: WFL for tasks assessed/performed Overall Cognitive Status: Within Functional Limits for tasks assessed                      General Comments General comments (skin integrity, edema, etc.): gave pt a hand out on various ways to strethch the gastroc/soleus--sitting standing and with towel    Exercises        Assessment/Plan  PT Assessment Patent does not need any further PT services  PT Diagnosis Abnormality of gait   PT Problem List    PT Treatment Interventions     PT Goals (Current goals can be found in the Care Plan section) Acute Rehab PT Goals PT Goal Formulation: All assessment and education complete, DC therapy    Frequency     Barriers to discharge        Co-evaluation               End of Session   Activity Tolerance: Patient tolerated treatment well Patient left: in chair;with call bell/phone within reach;with nursing/sitter in room Nurse Communication: Mobility status         Time: [id number] PT Time Calculation (min) (ACUTE ONLY): 35 min   Charges:   PT  Evaluation $Initial PT Evaluation Tier I: 1 Procedure PT Treatments $Gait Training: 8-22 mins   PT G Codes:        Leianna Barga, Eliseo GumKenneth V 11/03/2014, 12:13 PM  11/03/2014  Divernon BingKen Glenard Keesling, PT [phone]  (pager)

## 2014-11-05 LAB — CULTURE, BLOOD (ROUTINE X 2)
CULTURE: NO GROWTH
CULTURE: NO GROWTH

## 2014-11-06 DIAGNOSIS — E118 Type 2 diabetes mellitus with unspecified complications: Secondary | ICD-10-CM | POA: Diagnosis not present

## 2014-11-06 DIAGNOSIS — L0390 Cellulitis, unspecified: Secondary | ICD-10-CM | POA: Diagnosis not present

## 2014-11-07 DIAGNOSIS — I87321 Chronic venous hypertension (idiopathic) with inflammation of right lower extremity: Secondary | ICD-10-CM | POA: Diagnosis not present

## 2014-11-07 DIAGNOSIS — L02415 Cutaneous abscess of right lower limb: Secondary | ICD-10-CM | POA: Diagnosis not present

## 2014-11-10 ENCOUNTER — Inpatient Hospital Stay (HOSPITAL_COMMUNITY)
Admission: AD | Admit: 2014-11-10 | Discharge: 2014-11-17 | DRG: 603 | Disposition: A | Discharge status: Home Health | Payer: Medicare Other | Source: Ambulatory Visit | Attending: Internal Medicine | Admitting: Internal Medicine

## 2014-11-10 ENCOUNTER — Inpatient Hospital Stay (HOSPITAL_COMMUNITY): Payer: Medicare Other

## 2014-11-10 DIAGNOSIS — R600 Localized edema: Secondary | ICD-10-CM | POA: Diagnosis not present

## 2014-11-10 DIAGNOSIS — L03115 Cellulitis of right lower limb: Secondary | ICD-10-CM | POA: Diagnosis not present

## 2014-11-10 DIAGNOSIS — Z8249 Family history of ischemic heart disease and other diseases of the circulatory system: Secondary | ICD-10-CM

## 2014-11-10 DIAGNOSIS — Z66 Do not resuscitate: Secondary | ICD-10-CM | POA: Diagnosis present

## 2014-11-10 DIAGNOSIS — L03119 Cellulitis of unspecified part of limb: Secondary | ICD-10-CM

## 2014-11-10 DIAGNOSIS — E119 Type 2 diabetes mellitus without complications: Secondary | ICD-10-CM | POA: Diagnosis not present

## 2014-11-10 DIAGNOSIS — E785 Hyperlipidemia, unspecified: Secondary | ICD-10-CM | POA: Diagnosis present

## 2014-11-10 DIAGNOSIS — L409 Psoriasis, unspecified: Secondary | ICD-10-CM | POA: Diagnosis not present

## 2014-11-10 DIAGNOSIS — Z87891 Personal history of nicotine dependence: Secondary | ICD-10-CM | POA: Diagnosis not present

## 2014-11-10 DIAGNOSIS — E1165 Type 2 diabetes mellitus with hyperglycemia: Secondary | ICD-10-CM | POA: Diagnosis not present

## 2014-11-10 DIAGNOSIS — I129 Hypertensive chronic kidney disease with stage 1 through stage 4 chronic kidney disease, or unspecified chronic kidney disease: Secondary | ICD-10-CM | POA: Diagnosis present

## 2014-11-10 DIAGNOSIS — M7989 Other specified soft tissue disorders: Secondary | ICD-10-CM | POA: Diagnosis not present

## 2014-11-10 DIAGNOSIS — I10 Essential (primary) hypertension: Secondary | ICD-10-CM | POA: Diagnosis not present

## 2014-11-10 DIAGNOSIS — L0291 Cutaneous abscess, unspecified: Secondary | ICD-10-CM

## 2014-11-10 DIAGNOSIS — Z794 Long term (current) use of insulin: Secondary | ICD-10-CM

## 2014-11-10 DIAGNOSIS — M109 Gout, unspecified: Secondary | ICD-10-CM | POA: Diagnosis present

## 2014-11-10 DIAGNOSIS — L02415 Cutaneous abscess of right lower limb: Secondary | ICD-10-CM | POA: Diagnosis not present

## 2014-11-10 DIAGNOSIS — M869 Osteomyelitis, unspecified: Secondary | ICD-10-CM

## 2014-11-10 DIAGNOSIS — N183 Chronic kidney disease, stage 3 unspecified: Secondary | ICD-10-CM

## 2014-11-10 DIAGNOSIS — Z452 Encounter for adjustment and management of vascular access device: Secondary | ICD-10-CM | POA: Diagnosis not present

## 2014-11-10 DIAGNOSIS — I87321 Chronic venous hypertension (idiopathic) with inflammation of right lower extremity: Secondary | ICD-10-CM | POA: Diagnosis not present

## 2014-11-10 DIAGNOSIS — L403 Pustulosis palmaris et plantaris: Secondary | ICD-10-CM | POA: Diagnosis present

## 2014-11-10 DIAGNOSIS — L0390 Cellulitis, unspecified: Secondary | ICD-10-CM | POA: Diagnosis present

## 2014-11-10 DIAGNOSIS — L03116 Cellulitis of left lower limb: Secondary | ICD-10-CM | POA: Diagnosis not present

## 2014-11-10 DIAGNOSIS — Z5181 Encounter for therapeutic drug level monitoring: Secondary | ICD-10-CM | POA: Diagnosis not present

## 2014-11-10 LAB — COMPREHENSIVE METABOLIC PANEL
ALK PHOS: 85 U/L (ref 39–117)
ALT: 38 U/L (ref 0–53)
ANION GAP: 8 (ref 5–15)
AST: 31 U/L (ref 0–37)
Albumin: 3.4 g/dL — ABNORMAL LOW (ref 3.5–5.2)
BILIRUBIN TOTAL: 0.4 mg/dL (ref 0.3–1.2)
BUN: 27 mg/dL — ABNORMAL HIGH (ref 6–23)
CALCIUM: 9.4 mg/dL (ref 8.4–10.5)
CO2: 24 mmol/L (ref 19–32)
Chloride: 101 mmol/L (ref 96–112)
Creatinine, Ser: 1.66 mg/dL — ABNORMAL HIGH (ref 0.50–1.35)
GFR calc Af Amer: 47 mL/min — ABNORMAL LOW (ref >90)
GFR, EST NON AFRICAN AMERICAN: 40 mL/min — AB (ref >90)
Glucose, Bld: 192 mg/dL — ABNORMAL HIGH (ref 70–99)
POTASSIUM: 4.1 mmol/L (ref 3.5–5.1)
SODIUM: 133 mmol/L — AB (ref 135–145)
TOTAL PROTEIN: 6.8 g/dL (ref 6.0–8.3)

## 2014-11-10 LAB — CBC
HEMATOCRIT: 40.2 % (ref 39.0–52.0)
HEMOGLOBIN: 13 g/dL (ref 13.0–17.0)
MCH: 27.4 pg (ref 26.0–34.0)
MCHC: 32.3 g/dL (ref 30.0–36.0)
MCV: 84.8 fL (ref 78.0–100.0)
Platelets: 497 10*3/uL — ABNORMAL HIGH (ref 150–400)
RBC: 4.74 MIL/uL (ref 4.22–5.81)
RDW: 13.7 % (ref 11.5–15.5)
WBC: 6.9 10*3/uL (ref 4.0–10.5)

## 2014-11-10 LAB — SEDIMENTATION RATE: Sed Rate: 43 mm/hr — ABNORMAL HIGH (ref 0–16)

## 2014-11-10 LAB — GLUCOSE, CAPILLARY
GLUCOSE-CAPILLARY: 109 mg/dL — AB (ref 70–99)
Glucose-Capillary: 151 mg/dL — ABNORMAL HIGH (ref 70–99)

## 2014-11-10 MED ORDER — BUPROPION HCL ER (XL) 150 MG PO TB24
150.0000 mg | ORAL_TABLET | Freq: Every day | ORAL | Status: DC
  Administered 2014-11-11 – 2014-11-17 (×7): 150 mg via ORAL
  Filled 2014-11-10 (×8): qty 1

## 2014-11-10 MED ORDER — ACITRETIN 25 MG PO CAPS
25.0000 mg | ORAL_CAPSULE | ORAL | Status: DC
  Administered 2014-11-11 – 2014-11-17 (×4): 25 mg via ORAL
  Filled 2014-11-10 (×2): qty 1

## 2014-11-10 MED ORDER — LUBRIDERM SERIOUSLY SENSITIVE EX LOTN
1.0000 | TOPICAL_LOTION | Freq: Four times a day (QID) | CUTANEOUS | Status: DC
Start: 2014-11-10 — End: 2014-11-17
  Administered 2014-11-10 – 2014-11-17 (×22): 1 via TOPICAL
  Filled 2014-11-10 (×2): qty 562

## 2014-11-10 MED ORDER — IRBESARTAN 300 MG PO TABS
300.0000 mg | ORAL_TABLET | Freq: Every day | ORAL | Status: DC
Start: 2014-11-10 — End: 2014-11-17
  Administered 2014-11-11 – 2014-11-17 (×7): 300 mg via ORAL
  Filled 2014-11-10 (×7): qty 1

## 2014-11-10 MED ORDER — INSULIN ASPART 100 UNIT/ML ~~LOC~~ SOLN
0.0000 [IU] | Freq: Three times a day (TID) | SUBCUTANEOUS | Status: DC
Start: 2014-11-11 — End: 2014-11-17
  Administered 2014-11-11: 5 [IU] via SUBCUTANEOUS
  Administered 2014-11-11: 3 [IU] via SUBCUTANEOUS
  Administered 2014-11-11: 2 [IU] via SUBCUTANEOUS
  Administered 2014-11-12: 3 [IU] via SUBCUTANEOUS
  Administered 2014-11-12: 8 [IU] via SUBCUTANEOUS
  Administered 2014-11-12: 5 [IU] via SUBCUTANEOUS
  Administered 2014-11-13: 3 [IU] via SUBCUTANEOUS
  Administered 2014-11-13 (×2): 5 [IU] via SUBCUTANEOUS
  Administered 2014-11-14 (×2): 3 [IU] via SUBCUTANEOUS
  Administered 2014-11-14: 8 [IU] via SUBCUTANEOUS
  Administered 2014-11-15 (×3): 3 [IU] via SUBCUTANEOUS
  Administered 2014-11-16: 8 [IU] via SUBCUTANEOUS
  Administered 2014-11-16 (×2): 3 [IU] via SUBCUTANEOUS
  Administered 2014-11-17: 5 [IU] via SUBCUTANEOUS
  Administered 2014-11-17: 3 [IU] via SUBCUTANEOUS

## 2014-11-10 MED ORDER — ENOXAPARIN SODIUM 30 MG/0.3ML ~~LOC~~ SOLN: 30.0000 mg | SUBCUTANEOUS | Status: DC

## 2014-11-10 MED ORDER — ATORVASTATIN CALCIUM 10 MG PO TABS
20.0000 mg | ORAL_TABLET | Freq: Every day | ORAL | Status: DC
  Administered 2014-11-10 – 2014-11-16 (×7): 20 mg via ORAL
  Filled 2014-11-10 (×7): qty 2

## 2014-11-10 MED ORDER — HYDROCODONE-ACETAMINOPHEN 5-325 MG PO TABS
1.0000 | ORAL_TABLET | Freq: Every day | ORAL | Status: DC
  Administered 2014-11-10: 1 via ORAL
  Filled 2014-11-10: qty 1

## 2014-11-10 MED ORDER — PIPERACILLIN-TAZOBACTAM 3.375 G IVPB 30 MIN
3.3750 g | Freq: Once | INTRAVENOUS | Status: AC
  Administered 2014-11-10: 3.375 g via INTRAVENOUS
  Filled 2014-11-10: qty 50

## 2014-11-10 MED ORDER — VANCOMYCIN HCL 10 G IV SOLR
1500.0000 mg | INTRAVENOUS | Status: DC
  Administered 2014-11-11 – 2014-11-16 (×6): 1500 mg via INTRAVENOUS
  Filled 2014-11-10 (×7): qty 1500

## 2014-11-10 MED ORDER — FENOFIBRATE 160 MG PO TABS
160.0000 mg | ORAL_TABLET | ORAL | Status: DC
  Administered 2014-11-10 – 2014-11-16 (×4): 160 mg via ORAL
  Filled 2014-11-10 (×4): qty 1

## 2014-11-10 MED ORDER — HYDROCHLOROTHIAZIDE 25 MG PO TABS
25.0000 mg | ORAL_TABLET | Freq: Every day | ORAL | Status: DC
  Administered 2014-11-11 – 2014-11-17 (×8): 25 mg via ORAL
  Filled 2014-11-10 (×7): qty 1

## 2014-11-10 MED ORDER — PIPERACILLIN-TAZOBACTAM 3.375 G IVPB
3.3750 g | Freq: Three times a day (TID) | INTRAVENOUS | Status: DC
  Administered 2014-11-11 (×2): 3.375 g via INTRAVENOUS
  Filled 2014-11-10 (×4): qty 50

## 2014-11-10 MED ORDER — SACCHAROMYCES BOULARDII 250 MG PO CAPS
250.0000 mg | ORAL_CAPSULE | Freq: Two times a day (BID) | ORAL | Status: DC
  Administered 2014-11-10 – 2014-11-17 (×14): 250 mg via ORAL
  Filled 2014-11-10 (×16): qty 1

## 2014-11-10 MED ORDER — ALLOPURINOL 100 MG PO TABS
100.0000 mg | ORAL_TABLET | Freq: Every day | ORAL | Status: DC
  Administered 2014-11-11 – 2014-11-17 (×7): 100 mg via ORAL
  Filled 2014-11-10 (×8): qty 1

## 2014-11-10 MED ORDER — VANCOMYCIN HCL 10 G IV SOLR
1500.0000 mg | Freq: Once | INTRAVENOUS | Status: AC
  Administered 2014-11-10: 1500 mg via INTRAVENOUS
  Filled 2014-11-10: qty 1500

## 2014-11-10 MED ORDER — VALSARTAN-HYDROCHLOROTHIAZIDE 320-25 MG PO TABS: 1.0000 | ORAL_TABLET | Freq: Every day | ORAL | Status: DC

## 2014-11-10 MED ORDER — INSULIN DETEMIR 100 UNIT/ML ~~LOC~~ SOLN
18.0000 [IU] | Freq: Every day | SUBCUTANEOUS | Status: DC
Start: 2014-11-10 — End: 2014-11-17
  Administered 2014-11-10 – 2014-11-16 (×7): 18 [IU] via SUBCUTANEOUS
  Filled 2014-11-10 (×8): qty 0.18

## 2014-11-10 NOTE — Progress Notes (Signed)
Spoke with Dr. Lajoyce Cornersuda for direct admission of this patient.  Patient was seen in Dr. Audrie Liauda's office this morning for follow-up of his right lower extremity cellulitis. The patient was recently discharged from the hospital on 03/248/2016 for right lower extremity cellulitis. The patient received intravenous vancomycin and Zosyn at the time. He was discharged home on clindamycin. There was no significant improvement when he followed up with Dr. Lajoyce Cornersuda on 11/07/14, so he was started on doxy on that day.  On follow-up today, there was not much improvement on the patient's lower extremity. As result, admission was advised. Vital signs were not readily available, but Dr. Lajoyce Cornersuda stated that pt did not appear toxic.  Pt accepted for MedSurg bed.  DTat

## 2014-11-10 NOTE — Progress Notes (Signed)
ANTIBIOTIC CONSULT NOTE - INITIAL  Pharmacy Consult for Vancomycin + Zosyn Indication: Empiric cellulitis coverage  Allergies  Allergen Reactions  . Avelox Abc [Moxifloxacin] Anaphylaxis  . Tape Rash    Please use paper tape  . Ambien Cr [Zolpidem Tartrate Er] Other (See Comments)     Hallucinations- name brand only - tolerates generic  . Ambien [Zolpidem] Other (See Comments)     Hallucinations - name brand only - tolerates generic  . Naproxen Other (See Comments)    Caused nose bleeds    Patient Measurements:    Wt: 91.3 kg Ht: 69 inches   Vital Signs: Temp: 97.8 F (36.6 C) (04/04 2010) Temp Source: Oral (04/04 2010) BP: 142/81 mmHg (04/04 2010) Pulse Rate: 91 (04/04 2010) Intake/Output from previous day:   Intake/Output from this shift:    Labs:  Recent Labs  11/10/14 1915  WBC 6.9  HGB 13.0  PLT 497*  CREATININE 1.66*   Estimated Creatinine Clearance: 46.9 mL/min (by C-G formula based on Cr of 1.66). No results for input(s): VANCOTROUGH, VANCOPEAK, VANCORANDOM, GENTTROUGH, GENTPEAK, GENTRANDOM, TOBRATROUGH, TOBRAPEAK, TOBRARND, AMIKACINPEAK, AMIKACINTROU, AMIKACIN in the last 72 hours.   Microbiology: Recent Results (from the past 720 hour(s))  Blood culture (routine x 2)     Status: None   Collection Time: 10/30/14 12:30 AM  Result Value Ref Range Status   Specimen Description BLOOD RIGHT ARM  Final   Special Requests BOTTLES DRAWN AEROBIC AND ANAEROBIC 5 CC  Final   Culture   Final    NO GROWTH 5 DAYS [facility]formed [facility]    Report Status 11/05/2014 FINAL  Final  Blood culture (routine x 2)     Status: None   Collection Time: 10/30/14 12:34 AM  Result Value Ref Range Status   Specimen Description BLOOD LEFT UPPER ARM  Final   Special Requests BOTTLES DRAWN AEROBIC AND ANAEROBIC 5 CC  Final   Culture   Final    NO GROWTH 5 DAYS [facility]formed at [facility]    Report Status 11/05/2014 FINAL  Final    Medical History: Past  Medical History  Diagnosis Date  . Diabetes mellitus without complication   . Hyperlipidemia   . Hypertension   . Obesity     Assessment: [age] YOM admitted directly from the orthopedic clinic for recurrent RLE cellulitis - recently admitted earlier this month and treated with IV antibiotics. Pharmacy consulted to dose Vancomcyin + Zosyn this admission.  Wt: 91.3 kg, SCr 1.66, estimated CrCl~40-50 ml/min   Last admission, a dose of Vancomycin 1500 mg IV every 24 hours with a SCr that ranged from 1.4-1.7  produced a trough of 10.8 mcg/ml (within goal range for cellulitis)  Goal of Therapy:  Vancomycin trough level 10-15 mcg/ml  Plan:  1. Vancomycin 1500 mg IV x 1 dose now followed by 1500 mg IV every 24 hours 2. Zosyn 3.375g IV x 1 dose now (infused over 30 minutes) followed by 3.375g IV every 8 hours (infused over 4 hours) 3. Will continue to follow renal function, culture results, LOT, and antibiotic de-escalation plans   Georgina PillionElizabeth Viva Gallaher, PharmD, BCPS Clinical Pharmacist Pager: [phone] 11/10/2014 9:05 PM

## 2014-11-10 NOTE — H&P (Signed)
History and Physical  [name]:[id number] DOB: 1945/08/14 DOA: 11/10/2014  Referring physician: EDP PCP: Michel Bickers, MD   Chief Complaint: recurrent right lower extremity cellulitis  HPI: James Roberson is a 69 y.o. male  Was sent from orthopedics clinic for direct admission due to recurrent right lower extremity cellulitis. Patient was recently was admitted to the hospital for the same, received vanc/zosyn and was discharged on clinda, this was later changed to doxycycline due to persistent cellulitis, however, right lower extremity cellulitis persist with increase pain on ambulation, he was seen by Dr. Sharol Given and was sent to the hospital for direct admission for recurrent right lower extremity cellulitis. Patient currently sitting in chair, in good spirit, nontoxic, he denies headache, no chest pain, no sob, no fever, no n/v, no diarrhea.   He does has history of multiple operations to right lower extremity, h/o diabetes and psoriasis.  Review of Systems:  Detail per HPI, Review of systems are otherwise negative  Past Medical History  Diagnosis Date  . Diabetes mellitus without complication   . Hyperlipidemia   . Hypertension   . Obesity    Past Surgical History  Procedure Laterality Date  . Lumbar laminectomy  1989  . Back surgery    . Leg surgery     Social History:  reports that he quit smoking about 48 years ago. He has never used smokeless tobacco. He reports that he drinks alcohol. His drug history is not on file. Patient lives at home & is able to participate in activities of daily living with a cane  Allergies  Allergen Reactions  . Avelox Abc [Moxifloxacin] Anaphylaxis  . Tape Rash    Please use paper tape  . Ambien Cr [Zolpidem Tartrate Er] Other (See Comments)     Hallucinations- name brand only - tolerates generic  . Ambien [Zolpidem] Other (See Comments)     Hallucinations - name brand only - tolerates generic  . Naproxen Other (See Comments)   Caused nose bleeds    Family History  Problem Relation Age of Onset  . CAD Father       Prior to Admission medications   Medication Sig Start Date End Date Taking? Authorizing Provider  acitretin (SORIATANE) 25 MG capsule Take 25 mg by mouth every other day. For psoriasis   Yes Historical Provider, MD  allopurinol (ZYLOPRIM) 100 MG tablet Take 100 mg by mouth daily.   Yes Historical Provider, MD  atorvastatin (LIPITOR) 40 MG tablet Take 20 mg by mouth at bedtime.    Yes Historical Provider, MD  buPROPion (WELLBUTRIN XL) 150 MG 24 hr tablet Take 150 mg by mouth daily.  10/21/14  Yes Historical Provider, MD  Choline Fenofibrate (TRILIPIX) 135 MG capsule Take 135 mg by mouth every other day.   Yes Historical Provider, MD  doxycycline (VIBRAMYCIN) 100 MG capsule Take 100 mg by mouth 2 (two) times daily. 30 day course started on 11/07/2014 pm 11/07/14  Yes Historical Provider, MD  Emollient (LUBRIDERM SERIOUSLY SENSITIVE) LOTN Apply 1 application topically 2 (two) times daily. Patient taking differently: Apply 1 application topically 4 (four) times daily.  11/03/14  Yes [name], DO  HYDROcodone-acetaminophen (NORCO/VICODIN) 5-325 MG per tablet Take 1 tablet by mouth at bedtime.  10/28/14  Yes Historical Provider, MD  insulin detemir (LEVEMIR) 100 UNIT/ML injection Inject 0.18 mLs (18 Units total) into the skin at bedtime. 11/03/14  Yes [name], DO  saccharomyces boulardii (FLORASTOR) 250 MG capsule Take 1 capsule (250  mg total) by mouth 2 (two) times daily. Patient taking differently: Take 250 mg by mouth 2 (two) times daily. Take while on antibiotics 11/03/14  Yes [name], DO  sitaGLIPtin (JANUVIA) 100 MG tablet Take 50 mg by mouth at bedtime.    Yes Historical Provider, MD  valsartan-hydrochlorothiazide (DIOVAN-HCT) 320-25 MG per tablet Take 1 tablet by mouth daily.   Yes Historical Provider, MD  zolpidem (AMBIEN) 10 MG tablet Take 10 mg by mouth at bedtime.    Yes Historical  Provider, MD  clindamycin (CLEOCIN) 150 MG capsule Take 3 capsules (450 mg total) by mouth 3 (three) times daily. Started medication on 10-28-14. Patient unsure of dose. Please call pharmacy in AM if patient is admitted Patient not taking: Reported on 11/10/2014 11/03/14   [name], DO    Physical Exam: There were no vitals taken for this visit.  General:  AAOX3, NAD Eyes: PERRL ENT: unremarkable Neck: supple, no JVD Cardiovascular: RRR Respiratory: CTABL Abdomen: soft/ND/ND, positive bowel sounds Skin: chronic psoriatic rash Musculoskeletal:  Right lower extremity erythema, tender, warm to touch, no obvious abscess, no drainage, Psychiatric: calm/cooperative Neurologic: nonfocal          Labs on Admission:  Basic Metabolic Panel: No results for input(s): NA, K, CL, CO2, GLUCOSE, BUN, CREATININE, CALCIUM, MG, PHOS in the last 168 hours. Liver Function Tests: No results for input(s): AST, ALT, ALKPHOS, BILITOT, PROT, ALBUMIN in the last 168 hours. No results for input(s): LIPASE, AMYLASE in the last 168 hours. No results for input(s): AMMONIA in the last 168 hours. CBC: No results for input(s): WBC, NEUTROABS, HGB, HCT, MCV, PLT in the last 168 hours. Cardiac Enzymes: No results for input(s): CKTOTAL, CKMB, CKMBINDEX, TROPONINI in the last 168 hours.  BNP (last 3 results) No results for input(s): BNP in the last 8760 hours.  ProBNP (last 3 results) No results for input(s): PROBNP in the last 8760 hours.  CBG:  Recent Labs Lab 11/10/14 1714  GLUCAP 109*    Radiological Exams on Admission: No results found.  EKG: no ekg obtained.  Assessment/Plan Present on Admission:  . Cellulitis   Recurrent right lower extremity cellulitis: direct admission from ortho. Nontoxic appearing, will get basic labs including esr/crp, x ray, consider MRI if initial work up concerning for osteomyelitis. Start iv vanc/zosyn per pharmacy. Recently had right lower extremity US  during last admission, will not repeat this time.  IDDM2, continue home dose levemir, add SSI, check a1c. Hold oral diabetic meds while in the hospital.  H/o HTN/HLD/gout/psoriasis, stable at baseline, continue home meds.  CKDIII, cr at baseline, renal dosing meds.  DVT prophylaxis: lovenox 72m sub Q  Consultants: ortho  Code Status: DNR, verified with patient.  Family Communication:  Patient and wife  Disposition Plan: admit to med surg  Time spent: >747ms  Roxi Hlavaty MD, PhD [facility] Pager [phone] 7PM-7AM, please contact night-coverage at [email or web address], password [email or web address]

## 2014-11-11 ENCOUNTER — Inpatient Hospital Stay (HOSPITAL_COMMUNITY): Payer: Medicare Other

## 2014-11-11 DIAGNOSIS — N183 Chronic kidney disease, stage 3 unspecified: Secondary | ICD-10-CM

## 2014-11-11 LAB — BASIC METABOLIC PANEL
Anion gap: 5 (ref 5–15)
BUN: 23 mg/dL (ref 6–23)
CALCIUM: 9 mg/dL (ref 8.4–10.5)
CO2: 28 mmol/L (ref 19–32)
CREATININE: 1.39 mg/dL — AB (ref 0.50–1.35)
Chloride: 104 mmol/L (ref 96–112)
GFR calc non Af Amer: 50 mL/min — ABNORMAL LOW (ref >90)
GFR, EST AFRICAN AMERICAN: 58 mL/min — AB (ref >90)
Glucose, Bld: 170 mg/dL — ABNORMAL HIGH (ref 70–99)
Potassium: 4.4 mmol/L (ref 3.5–5.1)
Sodium: 137 mmol/L (ref 135–145)

## 2014-11-11 LAB — CBC
HEMATOCRIT: 37.8 % — AB (ref 39.0–52.0)
Hemoglobin: 12.1 g/dL — ABNORMAL LOW (ref 13.0–17.0)
MCH: 27.1 pg (ref 26.0–34.0)
MCHC: 32 g/dL (ref 30.0–36.0)
MCV: 84.8 fL (ref 78.0–100.0)
PLATELETS: 407 10*3/uL — AB (ref 150–400)
RBC: 4.46 MIL/uL (ref 4.22–5.81)
RDW: 13.7 % (ref 11.5–15.5)
WBC: 5.7 10*3/uL (ref 4.0–10.5)

## 2014-11-11 LAB — GLUCOSE, CAPILLARY
GLUCOSE-CAPILLARY: 172 mg/dL — AB (ref 70–99)
GLUCOSE-CAPILLARY: 195 mg/dL — AB (ref 70–99)
Glucose-Capillary: 149 mg/dL — ABNORMAL HIGH (ref 70–99)
Glucose-Capillary: 209 mg/dL — ABNORMAL HIGH (ref 70–99)

## 2014-11-11 LAB — C-REACTIVE PROTEIN

## 2014-11-11 MED ORDER — ZOLPIDEM TARTRATE 5 MG PO TABS
5.0000 mg | ORAL_TABLET | Freq: Every evening | ORAL | Status: DC | PRN
  Administered 2014-11-11 – 2014-11-15 (×5): 5 mg via ORAL
  Filled 2014-11-11 (×5): qty 1

## 2014-11-11 MED ORDER — HYDROCODONE-ACETAMINOPHEN 5-325 MG PO TABS
1.0000 | ORAL_TABLET | ORAL | Status: DC | PRN
  Administered 2014-11-11 – 2014-11-16 (×15): 2 via ORAL
  Filled 2014-11-11 (×15): qty 2

## 2014-11-11 MED ORDER — GADOBENATE DIMEGLUMINE 529 MG/ML IV SOLN
10.0000 mL | Freq: Once | INTRAVENOUS | Status: AC
  Administered 2014-11-11: 10 mL via INTRAVENOUS

## 2014-11-11 MED ORDER — ENOXAPARIN SODIUM 40 MG/0.4ML ~~LOC~~ SOLN
40.0000 mg | SUBCUTANEOUS | Status: DC
  Administered 2014-11-12 – 2014-11-16 (×5): 40 mg via SUBCUTANEOUS
  Filled 2014-11-11 (×5): qty 0.4

## 2014-11-11 MED ORDER — HYDROCODONE-ACETAMINOPHEN 5-325 MG PO TABS
2.0000 | ORAL_TABLET | Freq: Once | ORAL | Status: AC
  Administered 2014-11-11: 2 via ORAL
  Filled 2014-11-11: qty 2

## 2014-11-11 NOTE — Progress Notes (Signed)
PROGRESS NOTE  James Roberson EUM:[id number] DOB: 06-15-1946 DOA: 11/10/2014 PCP: Michel Bickers, MD  Brief history 69 year old male with a history of diabetes mellitus, hypertension, hyperlipidemia, and palmar-plantar pustulosis presented with cellulitis strictly from Dr. Jess Barters office.  The patient was recently discharged from the hospital on 11/03/2014 for right lower extremity cellulitis. The patient received intravenous vancomycin and Zosyn at the time. He was discharged home on clindamycin. There was no significant improvement when he followed up with Dr. Sharol Given on 11/07/14, so he was started on doxy on that day. On follow-up 11/10/14, there was not much improvement on the patient's lower extremity. As result, the patient was strictly admitted. Notably, this is the patient's fourth episode in the past 8 years, but he has not had an episode in nearly 6 years.  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  EXAM: RIGHT TIBIA AND FIBULA - 2 VIEW  COMPARISON:  03/09/2009, 08/15/2013  FINDINGS: There is soft tissue edema involving the calf. No acute fracture or subluxation. No radiopaque foreign body or soft tissue gas. No evidence for cortical erosion to indicate osteomyelitis.  IMPRESSION: Soft  tissue swelling of the calf.   Electronically Signed   By: Nolon Nations M.D.   On: 11/10/2014 21:02   Mr Tibia Fibula Right W Wo Contrast  11/11/2014   CLINICAL DATA:  Right lower extremity swelling, redness and tenderness.  EXAM: MRI OF LOWER RIGHT EXTREMITY WITHOUT AND WITH CONTRAST  TECHNIQUE: Multiplanar, multisequence MR imaging of the right was performed both before and after administration of intravenous contrast.  CONTRAST:  37m MULTIHANCE GADOBENATE DIMEGLUMINE 529 MG/ML IV SOLN  COMPARISON:  None.  FINDINGS: There is normal marrow signal. There is no acute fracture or subluxation. There is no periosteal reaction. There is no bone destruction.  There is mild edema in the subcutaneous fat of the right lower leg with minimal enhancement. There is no focal fluid collection. There is no soft tissue mass or hematoma.  The muscles are normal in signal. The muscles enhance normally and homogeneously. The visualized flexor, extensor, peroneal and Achilles tendons are intact. The neurovascular bundles are normal.  IMPRESSION: 1. Mild edema in the subcutaneous fat of the right lower leg with minimal enhancement concerning for mild cellulitis. No drainable fluid collection to suggest an abscess. 2. No evidence of osteomyelitis.   Electronically Signed   By: HKathreen Devoid  On: 11/11/2014 09:22         Subjective: Patient denies fevers, chills, headache, chest pain, dyspnea, nausea, vomiting, diarrhea, abdominal pain, dysuria, hematuria   Objective: Filed Vitals:   11/10/14 1630 11/10/14 2010 11/11/14 0533 11/11/14 1430  BP: 169/81 142/81 145/72 142/79  Pulse: 81 91 74 80  Temp: 98 F (36.7 C) 97.8 F (36.6 C) 97.9 F (36.6 C) 97.8 F (36.6 C)  TempSrc:  Oral Oral   Resp: '18 20 18 18  ' SpO2: 98% 98% 96% 100%    Intake/Output Summary (Last 24 hours) at 11/11/14 1955 Last data filed at 11/11/14 1700  Gross per 24 hour  Intake   1810 ml  Output      0 ml  Net   1810 ml   Weight  change:  Exam:   General:  Pt is alert, follows commands appropriately, not in acute distress  HEENT: No icterus, No thrush, No neck mass, [place]/AT  Cardiovascular: RRR, S1/S2, no rubs, no gallops  Respiratory: CTA bilaterally, no wheezing, no crackles, no rhonchi  Abdomen: Soft/+BS, non tender, non distended, no guarding Extremities: Right lower extremity erythema and induration from the infrapatellar area to the right ankle. No crepitance. No open or draining wounds. Data Reviewed: Basic Metabolic Panel:  Recent Labs Lab 11/10/14 1915 11/11/14 0710  NA 133* 137  K 4.1 4.4  CL 101 104  CO2 24 28  GLUCOSE 192* 170*  BUN 27* 23  CREATININE 1.66* 1.39*  CALCIUM 9.4 9.0   Liver Function Tests:  Recent Labs Lab 11/10/14 1915  AST 31  ALT 38  ALKPHOS 85  BILITOT 0.4  PROT 6.8  ALBUMIN 3.4*   No results for input(s): LIPASE, AMYLASE in the last 168 hours. No results for input(s): AMMONIA in the last 168 hours. CBC:  Recent Labs Lab 11/10/14 1915 11/11/14 0710  WBC 6.9 5.7  HGB 13.0 12.1*  HCT 40.2 37.8*  MCV 84.8 84.8  PLT 497* 407*   Cardiac Enzymes: No results for input(s): CKTOTAL, CKMB, CKMBINDEX, TROPONINI in the last 168 hours. BNP: Invalid input(s): POCBNP CBG:  Recent Labs Lab 11/10/14 1714 11/10/14 2139 11/11/14 0640 11/11/14 1126 11/11/14 1623  GLUCAP 109* 151* 149* 209* 172*    No results found for this or any previous visit (from the past 240 hour(s)).   Scheduled Meds: . acitretin  25 mg Oral QODAY  . allopurinol  100 mg Oral Daily  . atorvastatin  20 mg Oral QHS  . buPROPion  150 mg Oral Daily  . enoxaparin (LOVENOX) injection  40 mg Subcutaneous Q24H  . fenofibrate  160 mg Oral QODAY  . irbesartan  300 mg Oral Daily   And  . hydrochlorothiazide  25 mg Oral Daily  . insulin aspart  0-15 Units Subcutaneous TID WC  . insulin detemir  18 Units Subcutaneous QHS  . lubriderm seriously sensitive  1 application Topical QID  .  piperacillin-tazobactam (ZOSYN)  IV  3.375 g Intravenous 3 times per day  . saccharomyces boulardii  250 mg Oral BID  . vancomycin  1,500 mg Intravenous Q24H   Continuous Infusions:    Tory Septer, DO  [facility] Pager [phone]  If 7PM-7AM, please contact night-coverage [email or web address] Password [email or web address] 11/11/2014, 7:55 PM   LOS: 1 day

## 2014-11-11 NOTE — Consult Note (Signed)
  Induration improving on vancomycin and Zosyn right lower extremity. I will order an MRI scan to evaluate if there is a deep abscess in the right leg.

## 2014-11-12 DIAGNOSIS — L03116 Cellulitis of left lower limb: Secondary | ICD-10-CM

## 2014-11-12 LAB — CBC
HCT: 36.6 % — ABNORMAL LOW (ref 39.0–52.0)
HEMOGLOBIN: 11.9 g/dL — AB (ref 13.0–17.0)
MCH: 27.2 pg (ref 26.0–34.0)
MCHC: 32.5 g/dL (ref 30.0–36.0)
MCV: 83.8 fL (ref 78.0–100.0)
Platelets: 408 10*3/uL — ABNORMAL HIGH (ref 150–400)
RBC: 4.37 MIL/uL (ref 4.22–5.81)
RDW: 13.4 % (ref 11.5–15.5)
WBC: 5.6 10*3/uL (ref 4.0–10.5)

## 2014-11-12 LAB — BASIC METABOLIC PANEL
Anion gap: 13 (ref 5–15)
BUN: 15 mg/dL (ref 6–23)
CO2: 21 mmol/L (ref 19–32)
Calcium: 9.3 mg/dL (ref 8.4–10.5)
Chloride: 102 mmol/L (ref 96–112)
Creatinine, Ser: 1.35 mg/dL (ref 0.50–1.35)
GFR calc Af Amer: 60 mL/min — ABNORMAL LOW (ref >90)
GFR, EST NON AFRICAN AMERICAN: 52 mL/min — AB (ref >90)
GLUCOSE: 228 mg/dL — AB (ref 70–99)
POTASSIUM: 3.7 mmol/L (ref 3.5–5.1)
SODIUM: 136 mmol/L (ref 135–145)

## 2014-11-12 LAB — GLUCOSE, CAPILLARY
GLUCOSE-CAPILLARY: 265 mg/dL — AB (ref 70–99)
Glucose-Capillary: 181 mg/dL — ABNORMAL HIGH (ref 70–99)
Glucose-Capillary: 222 mg/dL — ABNORMAL HIGH (ref 70–99)
Glucose-Capillary: 264 mg/dL — ABNORMAL HIGH (ref 70–99)

## 2014-11-12 LAB — HEMOGLOBIN A1C
Hgb A1c MFr Bld: 8 % — ABNORMAL HIGH (ref 4.8–5.6)
Mean Plasma Glucose: 183 mg/dL

## 2014-11-12 NOTE — Progress Notes (Signed)
Patient ID: James DustJonathan S Roberson, male   DOB: 01-05-1946, 69 y.o.   MRN: [id number] Cellulitis, induration, swelling continued to improve in right leg. MRIs shows no signs of deep abscess. Continue IV antibiotics.

## 2014-11-12 NOTE — Progress Notes (Signed)
Inpatient Diabetes Program Recommendations  AACE/ADA: New Consensus Statement on Inpatient Glycemic Control (2013)  Target Ranges:  Prepandial:   less than 140 mg/dL      Peak postprandial:   less than 180 mg/dL (1-2 hours)      Critically ill patients:  140 - 180 mg/dL   Results for James Roberson, James Roberson (MRN [id number]) as of 11/12/2014 09:31  Ref. Range 11/11/2014 06:40 11/11/2014 11:26 11/11/2014 16:23 11/11/2014 21:48 11/12/2014 06:29  Glucose-Capillary Latest Range: 70-99 mg/dL 409149 (H) 811209 (H) 914172 (H) 195 (H) 181 (H)   Diabetes history: DM2 Outpatient Diabetes medications: Levemir 18 units QHS, Januvia 50 mg QHS Current orders for Inpatient glycemic control: Levemir 18 units QHS, Novolog 0-15 units TID with meals  Inpatient Diabetes Program Recommendations Insulin - Meal Coverage: According to the chart, patient is eating 100% of meals and post prandial glucose is consistently elevated. Please consider ordering Novolog 4 units TID with meals for meal coverage (in addition to Novolog correction).  Thanks, Orlando PennerMarie Sima Lindenberger, RN, MSN, CCRN, CDE Diabetes Coordinator Inpatient Diabetes Program [phone] (Team Pager from 8am to 5pm) [phone] ([facility] office) [phone] [facility] office)

## 2014-11-12 NOTE — Progress Notes (Signed)
[facility]                                                                              Patient Demographics  James Roberson, is a 69 y.o. male, DOB - 07-31-1946, DJM:[id number]  Admit date - 11/10/2014   Admitting Physician Orson Eva, MD  Outpatient Primary MD for the patient is Michel Bickers, MD  LOS - 2   No chief complaint on file.     Brief history 69 year old male with a history of diabetes mellitus, hypertension, hyperlipidemia, and palmar-plantar pustulosis presented with cellulitis strictly from Dr. Jess Barters office. The patient was recently discharged from the hospital on 11/03/2014 for right lower extremity cellulitis. The patient received intravenous vancomycin and Zosyn at the time. He was discharged home on clindamycin. There was no significant improvement when he followed up with Dr. Sharol Given on 11/07/14, so he was started on doxy on that day. On follow-up 11/10/14, there was not much improvement on the patient's lower extremity. As result, the patient was strictly admitted. Notably, this is the patient's fourth episode in the past 8 years, but he has not had an episode in nearly 6 years.   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  Continue IV antibiotics  Time Spent  in minutes   30 minutes  Procedures  None  Consults   Orthopedics, Dr. Sharol Given  DVT Prophylaxis  Lovenox  Lab Results  Component Value Date   PLT 408* 11/12/2014    Medications  Scheduled Meds: . acitretin  25 mg Oral QODAY  . allopurinol  100 mg Oral Daily  . atorvastatin  20 mg Oral QHS  . buPROPion  150 mg Oral Daily  . enoxaparin (LOVENOX) injection  40 mg Subcutaneous Q24H  . fenofibrate  160 mg Oral QODAY  . irbesartan  300 mg Oral Daily   And  . hydrochlorothiazide  25 mg Oral Daily  . insulin aspart  0-15 Units Subcutaneous TID WC  . insulin detemir  18 Units Subcutaneous QHS  . lubriderm seriously sensitive  1 application Topical QID  . saccharomyces boulardii  250 mg Oral BID  . vancomycin  1,500 mg Intravenous Q24H   Continuous Infusions:  PRN Meds:.HYDROcodone-acetaminophen, zolpidem  Antibiotics    Anti-infectives    Start     Dose/Rate Route Frequency Ordered Stop   11/11/14 2200  vancomycin (VANCOCIN) 1,500 mg in sodium chloride 0.9 % 500 mL IVPB     1,500 mg 250 mL/hr over 120 Minutes Intravenous Every 24 hours 11/10/14 2109     11/11/14 0600  piperacillin-tazobactam (ZOSYN) IVPB  3.375 g  Status:  Discontinued     3.375 g 12.5 mL/hr over 240 Minutes Intravenous 3 times per day 11/10/14 2107 11/11/14 2054   11/10/14 2000  vancomycin (VANCOCIN) 1,500 mg in sodium chloride 0.9 % 500 mL IVPB     1,500 mg 250 mL/hr over 120 Minutes Intravenous  Once 11/10/14 1909 11/11/14 0014   11/10/14 1830  piperacillin-tazobactam (ZOSYN) IVPB 3.375 g     3.375 g 100 mL/hr over 30 Minutes Intravenous  Once 11/10/14 1823 11/10/14 2244        Subjective:   James Roberson seen and examined today. Patient denies chest pain, abdominal pain, shortness of breath.  Complains of right leg pain and swelling.    Objective:   Filed Vitals:   11/11/14 0533 11/11/14 1430 11/11/14 2124 11/12/14 0516  BP: 145/72 142/79 152/75 144/69  Pulse: 74 80 84 81  Temp: 97.9 F  (36.6 C) 97.8 F (36.6 C) 98.1 F (36.7 C) 97.6 F (36.4 C)  TempSrc: Oral     Resp: _0 SpO2: 96% 100% 98% 95%    Wt Readings from Last 3 Encounters:  10/30/14 91.3 kg (201 lb 4.5 oz)  07/23/13 92.534 kg (204 lb)  10/21/08 88.043 kg (194 lb 1.6 oz)     Intake/Output Summary (Last 24 hours) at 11/12/14 1209 Last data filed at 11/11/14 2105  Gross per 24 hour  Intake   1630 ml  Output      0 ml  Net   1630 ml    Exam  General: Well developed, well nourished, NAD, appears stated age  HEENT: NCAT, mucous membranes moist.   Cardiovascular: S1 S2 auscultated, no rubs, murmurs or gallops. Regular rate and rhythm.  Respiratory: Clear to auscultation bilaterally with equal chest rise  Abdomen: Soft, nontender, nondistended, + bowel sounds  Extremities: warm dry without cyanosis clubbing. RLE: erythema and edema from ankle to infrapatellar area, no open wounds or drainage, area of induration on right lateral area.   Neuro: AAOx3, Nonfocal  Psych: Normal affect and demeanor with intact judgement and insight   Data Review   Micro Results No results found for this or any previous visit (from the past 240 hour(s)).  Radiology Reports Dg Tibia/fibula Right  11/10/2014   CLINICAL DATA:  Chronic recurring infection right tibia.  EXAM: RIGHT TIBIA AND FIBULA - 2 VIEW  COMPARISON:  03/09/2009, 08/15/2013  FINDINGS: There is soft tissue edema involving the calf. No acute fracture or subluxation. No radiopaque foreign body or soft tissue gas. No evidence for cortical erosion to indicate osteomyelitis.  IMPRESSION: Soft tissue swelling of the calf.   Electronically Signed   By: Nolon Nations M.D.   On: 11/10/2014 21:02   Mr Tibia Fibula Right W Wo Contrast  11/11/2014   CLINICAL DATA:  Right lower extremity swelling, redness and tenderness.  EXAM: MRI OF LOWER RIGHT EXTREMITY WITHOUT AND WITH CONTRAST  TECHNIQUE: Multiplanar, multisequence MR imaging of the right was  performed both before and after administration of intravenous contrast.  CONTRAST:  35m MULTIHANCE GADOBENATE DIMEGLUMINE 529 MG/ML IV SOLN  COMPARISON:  None.  FINDINGS: There is normal marrow signal. There is no acute fracture or subluxation. There is no periosteal reaction. There is no bone destruction.  There is mild edema in the subcutaneous fat of the right lower leg with minimal enhancement. There is no focal fluid collection. There is no soft tissue mass or hematoma.  The muscles are normal in  signal. The muscles enhance normally and homogeneously. The visualized flexor, extensor, peroneal and Achilles tendons are intact. The neurovascular bundles are normal.  IMPRESSION: 1. Mild edema in the subcutaneous fat of the right lower leg with minimal enhancement concerning for mild cellulitis. No drainable fluid collection to suggest an abscess. 2. No evidence of osteomyelitis.   Electronically Signed   By: Kathreen Devoid   On: 11/11/2014 09:22    CBC  Recent Labs Lab 11/10/14 1915 11/11/14 0710 11/12/14 0512  WBC 6.9 5.7 5.6  HGB 13.0 12.1* 11.9*  HCT 40.2 37.8* 36.6*  PLT 497* 407* 408*  MCV 84.8 84.8 83.8  MCH 27.4 27.1 27.2  MCHC 32.3 32.0 32.5  RDW 13.7 13.7 13.4    Chemistries   Recent Labs Lab 11/10/14 1915 11/11/14 0710 11/12/14 0512  NA 133* 137 136  K 4.1 4.4 3.7  CL 101 104 102  CO2 _0 GLUCOSE 192* 170* 228*  BUN 27* 23 15  CREATININE 1.66* 1.39* 1.35  CALCIUM 9.4 9.0 9.3  AST 31  --   --   ALT 38  --   --   ALKPHOS 85  --   --   BILITOT 0.4  --   --    ------------------------------------------------------------------------------------------------------------------ estimated creatinine clearance is 57.6 mL/min (by C-G formula based on Cr of 1.35). ------------------------------------------------------------------------------------------------------------------  Recent Labs  11/10/14 1915  HGBA1C 8.0*    ------------------------------------------------------------------------------------------------------------------ No results for input(s): CHOL, HDL, LDLCALC, TRIG, CHOLHDL, LDLDIRECT in the last 72 hours. ------------------------------------------------------------------------------------------------------------------ No results for input(s): TSH, T4TOTAL, T3FREE, THYROIDAB in the last 72 hours.  Invalid input(s): FREET3 ------------------------------------------------------------------------------------------------------------------ No results for input(s): VITAMINB12, FOLATE, FERRITIN, TIBC, IRON, RETICCTPCT in the last 72 hours.  Coagulation profile No results for input(s): INR, PROTIME in the last 168 hours.  No results for input(s): DDIMER in the last 72 hours.  Cardiac Enzymes No results for input(s): CKMB, TROPONINI, MYOGLOBIN in the last 168 hours.  Invalid input(s): CK ------------------------------------------------------------------------------------------------------------------ Invalid input(s): POCBNP    Joseandres Mazer D.O. on 11/12/2014 at 12:09 PM  Between 7am to 7pm - Pager - [phone]  After 7pm go to [email or web address] - password [email or web address]  And look for the night coverage person covering for me after hours  [facility] Office  [phone]

## 2014-11-13 LAB — GLUCOSE, CAPILLARY
Glucose-Capillary: 190 mg/dL — ABNORMAL HIGH (ref 70–99)
Glucose-Capillary: 208 mg/dL — ABNORMAL HIGH (ref 70–99)
Glucose-Capillary: 208 mg/dL — ABNORMAL HIGH (ref 70–99)
Glucose-Capillary: 156 mg/dL — ABNORMAL HIGH (ref 70–99)

## 2014-11-13 NOTE — Progress Notes (Signed)
ANTIBIOTIC CONSULT NOTE Pharmacy Consult for Vancomycin  Indication: Empiric cellulitis coverage  Allergies  Allergen Reactions  . Avelox Abc [Moxifloxacin] Anaphylaxis  . Tape Rash    Please use paper tape  . Ambien Cr [Zolpidem Tartrate Er] Other (See Comments)     Hallucinations- name brand only - tolerates generic  . Ambien [Zolpidem] Other (See Comments)     Hallucinations - name brand only - tolerates generic  . Naproxen Other (See Comments)    Caused nose bleeds    Patient Measurements: Weight: 200 lb 9.9 oz (91 kg)  Wt: 91.3 kg Ht: 69 inches   Labs:  Recent Labs  11/10/14 1915 11/11/14 0710 11/12/14 0512  WBC 6.9 5.7 5.6  HGB 13.0 12.1* 11.9*  PLT 497* 407* 408*  CREATININE 1.66* 1.39* 1.35   Estimated Creatinine Clearance: 57.6 mL/min (by C-G formula based on Cr of 1.35). No results for input(s): VANCOTROUGH, VANCOPEAK, VANCORANDOM, GENTTROUGH, GENTPEAK, GENTRANDOM, TOBRATROUGH, TOBRAPEAK, TOBRARND, AMIKACINPEAK, AMIKACINTROU, AMIKACIN in the last 72 hours.   Microbiology: Recent Results (from the past 720 hour(s))  Blood culture (routine x 2)     Status: None   Collection Time: 10/30/14 12:30 AM  Result Value Ref Range Status   Specimen Description BLOOD RIGHT ARM  Final   Special Requests BOTTLES DRAWN AEROBIC AND ANAEROBIC 5 CC  Final   Culture   Final    NO GROWTH 5 DAYS Performed at [facility]    Report Status 11/05/2014 FINAL  Final  Blood culture (routine x 2)     Status: None   Collection Time: 10/30/14 12:34 AM  Result Value Ref Range Status   Specimen Description BLOOD LEFT UPPER ARM  Final   Special Requests BOTTLES DRAWN AEROBIC AND ANAEROBIC 5 CC  Final   Culture   Final    NO GROWTH 5 DAYS Performed at [facility]    Report Status 11/05/2014 FINAL  Final    Medical History: Past Medical History  Diagnosis Date  . Diabetes mellitus without complication   . Hyperlipidemia   . Hypertension   . Obesity      Assessment: [age] YOM admitted directly from the orthopedic clinic for recurrent RLE cellulitis - recently admitted earlier this month and treated with IV antibiotics.  Last admission, a dose of Vancomycin 1500 mg IV every 24 hours with a SCr that ranged from 1.4-1.7  produced a trough of 10.8 mcg/ml (within goal range for cellulitis)  Continues on vancomycin for cellulitis, Scr stable, WBC stable, no cultures, afebrile Vancomycin 4/4>  Goal of Therapy:  Vancomycin trough level 10-15 mcg/ml  Plan:  Continue vancomycin 1500 mg iv Q 24 hours Continue to follow  Thank you. Okey RegalLisa Rhylin Venters, PharmD [phone] 11/13/2014 1:30 PM

## 2014-11-13 NOTE — Progress Notes (Signed)
Utilization review completed.  

## 2014-11-13 NOTE — Progress Notes (Signed)
[facility]                                                                              Patient Demographics  James Roberson, is a 69 y.o. male, DOB - 28-Mar-1946, AJG:[id number]  Admit date - 11/10/2014   Admitting Physician Orson Eva, MD  Outpatient Primary MD for the patient is Michel Bickers, MD  LOS - 3   No chief complaint on file.     Brief history 69 year old male with a history of diabetes mellitus, hypertension, hyperlipidemia, and palmar-plantar pustulosis presented with cellulitis strictly from Dr. Jess Barters office. The patient was recently discharged from the hospital on 11/03/2014 for right lower extremity cellulitis. The patient received intravenous vancomycin and Zosyn at the time. He was discharged home on clindamycin. There was no significant improvement when he followed up with Dr. Sharol Given on 11/07/14, so he was started on doxy on that day. On follow-up 11/10/14, there was not much improvement on the patient's lower extremity. As result, the patient was strictly admitted. Notably, this is the patient's fourth episode in the past 8 years, but he has not had an episode in nearly 6 years.   Assessment & Plan   Cellulitis of the right lower extremity -Underlying etiology is due to the patient's dermatologic condition; possible palmar/plantar pustulosis which causes the patient's exfoliation  -Failed outpatient treatment with oral antibiotics -Currently improving -CRP <0.5, ESR 43 -initially on vanc and zosyn, however zosyn discontinued -Continue vancomycin  -Keep RLE elevated -11/11/2014 MRI right lower extremity negative for abscess or osteomyelitis  -Will speak with ID regarding possible treatment (outpatient) options  Hypertension  -Continue ARB/HCTZ   Diabetes mellitus type 2  -Hemoglobin A1c 8.0 -Continue ISS and Levemir 18 units at bedtime  Hyperlipidemia -Continue statin  CKD stage III -Baseline creatinine 1.3-1.6  Code Status: Full  Family  Communication: None at bedside  Disposition Plan: Admitted.  Continue IV antibiotics.  Possible discharge this weekend, depending on improvement of cellulitis.  Time Spent in minutes   30 minutes  Procedures  None  Consults   Orthopedics, Dr. Sharol Given  DVT Prophylaxis  Lovenox  Lab Results  Component Value Date   PLT 408* 11/12/2014    Medications  Scheduled Meds: . acitretin  25 mg Oral QODAY  . allopurinol  100 mg Oral Daily  . atorvastatin  20 mg Oral QHS  . buPROPion  150 mg Oral Daily  . enoxaparin (LOVENOX) injection  40 mg Subcutaneous Q24H  . fenofibrate  160 mg Oral QODAY  . irbesartan  300 mg Oral Daily   And  . hydrochlorothiazide  25 mg Oral Daily  . insulin aspart  0-15 Units Subcutaneous TID WC  . insulin detemir  18 Units Subcutaneous QHS  . lubriderm seriously sensitive  1 application Topical QID  . saccharomyces boulardii  250 mg Oral BID  . vancomycin  1,500 mg Intravenous Q24H   Continuous Infusions:  PRN Meds:.HYDROcodone-acetaminophen, zolpidem  Antibiotics    Anti-infectives    Start     Dose/Rate Route Frequency Ordered Stop   11/11/14 2200  vancomycin (VANCOCIN) 1,500 mg in sodium chloride 0.9 % 500 mL IVPB     1,500 mg  250 mL/hr over 120 Minutes Intravenous Every 24 hours 11/10/14 2109     11/11/14 0600  piperacillin-tazobactam (ZOSYN) IVPB 3.375 g  Status:  Discontinued     3.375 g 12.5 mL/hr over 240 Minutes Intravenous 3 times per day 11/10/14 2107 11/11/14 2054   11/10/14 2000  vancomycin (VANCOCIN) 1,500 mg in sodium chloride 0.9 % 500 mL IVPB     1,500 mg 250 mL/hr over 120 Minutes Intravenous  Once 11/10/14 1909 11/11/14 0014   11/10/14 1830  piperacillin-tazobactam (ZOSYN) IVPB 3.375 g     3.375 g 100 mL/hr over 30 Minutes Intravenous  Once 11/10/14 1823 11/10/14 2244        Subjective:   James Roberson seen and examined today. Patient denies chest pain, abdominal pain, shortness of breath.  Complains of right leg pain and  swelling, but feels it is improving.  Inquires about possible use of prophylactic antibiotics.   Objective:   Filed Vitals:   11/12/14 0516 11/12/14 1500 11/12/14 2150 11/13/14 0618  BP: 144/69 142/68 150/74 135/77  Pulse: 81 80 87 82  Temp: 97.6 F (36.4 C) 97.8 F (36.6 C) 97.8 F (36.6 C) 97.9 F (36.6 C)  TempSrc:   Oral Oral  Resp: '18 16 18 16  ' SpO2: 95% 96% 97% 95%    Wt Readings from Last 3 Encounters:  10/30/14 91.3 kg (201 lb 4.5 oz)  07/23/13 92.534 kg (204 lb)  10/21/08 88.043 kg (194 lb 1.6 oz)     Intake/Output Summary (Last 24 hours) at 11/13/14 1016 Last data filed at 11/12/14 1700  Gross per 24 hour  Intake    720 ml  Output      0 ml  Net    720 ml    Exam  General: Well developed, well nourished, NAD  Cardiovascular: S1 S2 auscultated, RRR, no murmurs  Respiratory: Clear to auscultation  Abdomen: Soft, nontender, nondistended, + bowel sounds  Extremities: warm dry without cyanosis clubbing. RLE: erythema and edema (improving) from ankle to infrapatellar area, no open wounds or drainage, area of induration on right lateral area.   Neuro: AAOx3, Nonfocal  Psych: Appropriate mood and affect   Data Review   Micro Results No results found for this or any previous visit (from the past 240 hour(s)).  Radiology Reports Dg Tibia/fibula Right  11/10/2014   CLINICAL DATA:  Chronic recurring infection right tibia.  EXAM: RIGHT TIBIA AND FIBULA - 2 VIEW  COMPARISON:  03/09/2009, 08/15/2013  FINDINGS: There is soft tissue edema involving the calf. No acute fracture or subluxation. No radiopaque foreign body or soft tissue gas. No evidence for cortical erosion to indicate osteomyelitis.  IMPRESSION: Soft tissue swelling of the calf.   Electronically Signed   By: Nolon Nations M.D.   On: 11/10/2014 21:02   Mr Tibia Fibula Right W Wo Contrast  11/11/2014   CLINICAL DATA:  Right lower extremity swelling, redness and tenderness.  EXAM: MRI OF LOWER RIGHT  EXTREMITY WITHOUT AND WITH CONTRAST  TECHNIQUE: Multiplanar, multisequence MR imaging of the right was performed both before and after administration of intravenous contrast.  CONTRAST:  49m MULTIHANCE GADOBENATE DIMEGLUMINE 529 MG/ML IV SOLN  COMPARISON:  None.  FINDINGS: There is normal marrow signal. There is no acute fracture or subluxation. There is no periosteal reaction. There is no bone destruction.  There is mild edema in the subcutaneous fat of the right lower leg with minimal enhancement. There is no focal fluid collection. There is no soft  tissue mass or hematoma.  The muscles are normal in signal. The muscles enhance normally and homogeneously. The visualized flexor, extensor, peroneal and Achilles tendons are intact. The neurovascular bundles are normal.  IMPRESSION: 1. Mild edema in the subcutaneous fat of the right lower leg with minimal enhancement concerning for mild cellulitis. No drainable fluid collection to suggest an abscess. 2. No evidence of osteomyelitis.   Electronically Signed   By: Kathreen Devoid   On: 11/11/2014 09:22    CBC  Recent Labs Lab 11/10/14 1915 11/11/14 0710 11/12/14 0512  WBC 6.9 5.7 5.6  HGB 13.0 12.1* 11.9*  HCT 40.2 37.8* 36.6*  PLT 497* 407* 408*  MCV 84.8 84.8 83.8  MCH 27.4 27.1 27.2  MCHC 32.3 32.0 32.5  RDW 13.7 13.7 13.4    Chemistries   Recent Labs Lab 11/10/14 1915 11/11/14 0710 11/12/14 0512  NA 133* 137 136  K 4.1 4.4 3.7  CL 101 104 102  CO2 '24 28 21  ' GLUCOSE 192* 170* 228*  BUN 27* 23 15  CREATININE 1.66* 1.39* 1.35  CALCIUM 9.4 9.0 9.3  AST 31  --   --   ALT 38  --   --   ALKPHOS 85  --   --   BILITOT 0.4  --   --    ------------------------------------------------------------------------------------------------------------------ CrCl cannot be calculated (Unknown ideal weight.). ------------------------------------------------------------------------------------------------------------------  Recent Labs   11/10/14 1915  HGBA1C 8.0*   ------------------------------------------------------------------------------------------------------------------ No results for input(s): CHOL, HDL, LDLCALC, TRIG, CHOLHDL, LDLDIRECT in the last 72 hours. ------------------------------------------------------------------------------------------------------------------ No results for input(s): TSH, T4TOTAL, T3FREE, THYROIDAB in the last 72 hours.  Invalid input(s): FREET3 ------------------------------------------------------------------------------------------------------------------ No results for input(s): VITAMINB12, FOLATE, FERRITIN, TIBC, IRON, RETICCTPCT in the last 72 hours.  Coagulation profile No results for input(s): INR, PROTIME in the last 168 hours.  No results for input(s): DDIMER in the last 72 hours.  Cardiac Enzymes No results for input(s): CKMB, TROPONINI, MYOGLOBIN in the last 168 hours.  Invalid input(s): CK ------------------------------------------------------------------------------------------------------------------ Invalid input(s): POCBNP    James Roberson D.O. on 11/13/2014 at 10:16 AM  Between 7am to 7pm - Pager - [phone]  After 7pm go to [email or web address] - password [email or web address]  And look for the night coverage person covering for me after hours  [facility] Office  [phone]

## 2014-11-13 NOTE — Progress Notes (Signed)
Patient ID: James DustJonathan S Burkhalter, male   DOB: [birth year], 69 y.o.   MRN: [id number] Patient's right leg cellulitis continues to improve. Possible discharge to home this weekend. Orders written that patient may shower.

## 2014-11-13 NOTE — Progress Notes (Signed)
Inpatient Diabetes Program Recommendations  AACE/ADA: New Consensus Statement on Inpatient Glycemic Control (2013)  Target Ranges:  Prepandial:   less than 140 mg/dL      Peak postprandial:   less than 180 mg/dL (1-2 hours)      Critically ill patients:  140 - 180 mg/dL   Results for James Roberson, James Roberson (MRN [id number]) as of 11/13/2014 09:33  Ref. Range 11/12/2014 06:29 11/12/2014 11:14 11/12/2014 16:21 11/12/2014 21:48 11/13/2014 07:00  Glucose-Capillary Latest Range: 70-99 mg/dL 829181 (H) 562222 (H) 130265 (H) 264 (H) 190 (H)   Diabetes history: DM2 Outpatient Diabetes medications: Levemir 18 units QHS, Januvia 50 mg QHS Current orders for Inpatient glycemic control: Levemir 18 units QHS, Novolog 0-15 units TID with meals  Inpatient Diabetes Program Recommendations Correction (SSI): Bedtime glucose 264 mg/dl last night but no correction given since no bedtime scale ordered. Please consider ordering Novolog bedtime correction scale. Insulin - Meal Coverage: According to the chart, patient is eating 100% of meals and post prandial glucose has remained consistently elevated despite Novolog correction. Please consider ordering Novolog 4 units TID with meals for meal coverage (in addition to Novolog correction).   Thanks, Orlando PennerMarie Bayne Fosnaugh, RN, MSN, CCRN, CDE Diabetes Coordinator Inpatient Diabetes Program [phone] (Team Pager from 8am to 5pm) [phone] ([facility] office) [phone] [facility] office)

## 2014-11-14 LAB — BASIC METABOLIC PANEL
Anion gap: 12 (ref 5–15)
BUN: 19 mg/dL (ref 6–23)
CHLORIDE: 100 mmol/L (ref 96–112)
CO2: 24 mmol/L (ref 19–32)
Calcium: 9.8 mg/dL (ref 8.4–10.5)
Creatinine, Ser: 1.46 mg/dL — ABNORMAL HIGH (ref 0.50–1.35)
GFR, EST AFRICAN AMERICAN: 55 mL/min — AB (ref >90)
GFR, EST NON AFRICAN AMERICAN: 47 mL/min — AB (ref >90)
GLUCOSE: 214 mg/dL — AB (ref 70–99)
POTASSIUM: 3.8 mmol/L (ref 3.5–5.1)
Sodium: 136 mmol/L (ref 135–145)

## 2014-11-14 LAB — GLUCOSE, CAPILLARY
GLUCOSE-CAPILLARY: 180 mg/dL — AB (ref 70–99)
Glucose-Capillary: 259 mg/dL — ABNORMAL HIGH (ref 70–99)
Glucose-Capillary: 170 mg/dL — ABNORMAL HIGH (ref 70–99)
Glucose-Capillary: 240 mg/dL — ABNORMAL HIGH (ref 70–99)

## 2014-11-14 NOTE — Progress Notes (Signed)
Inpatient Diabetes Program Recommendations  AACE/ADA: New Consensus Statement on Inpatient Glycemic Control (2013)  Target Ranges:  Prepandial:   less than 140 mg/dL      Peak postprandial:   less than 180 mg/dL (1-2 hours)      Critically ill patients:  140 - 180 mg/dL   Reason for Visit: Elevated post-prandial blood sugars  Results for James Roberson, James Roberson (MRN [id number]) as of 11/14/2014 15:07  Ref. Range 11/13/2014 11:53 11/13/2014 16:38 11/13/2014 21:29 11/14/2014 07:42 11/14/2014 11:42  Glucose-Capillary Latest Range: 70-99 mg/dL 409208 (H) 811208 (H) 914156 (H) 180 (H) 259 (H)   Elevated post-prandial blood sugars.  Please consider addition of meal coverage insulin - Novolog 4 units tidwc. Will continue to follow.  Thank you. Ailene Ardshonda Noami Bove, RD, LDN, CDE Inpatient Diabetes Coordinator [phone]

## 2014-11-14 NOTE — Progress Notes (Signed)
[facility]                                                                              Patient Demographics  James Roberson, is a 69 y.o. male, DOB - 1946-05-07, NOB:[id number]  Admit date - 11/10/2014   Admitting Physician Orson Eva, MD  Outpatient Primary MD for the patient is Michel Bickers, MD  LOS - 4   No chief complaint on file.     Brief history 69 year old male with a history of diabetes mellitus, hypertension, hyperlipidemia, and palmar-plantar pustulosis presented with cellulitis strictly from Dr. Jess Barters office. The patient was recently discharged from the hospital on 11/03/2014 for right lower extremity cellulitis. The patient received intravenous vancomycin and Zosyn at the time. He was discharged home on clindamycin. There was no significant improvement when he followed up with Dr. Sharol Given on 11/07/14, so he was started on doxy on that day. On follow-up 11/10/14, there was not much improvement on the patient's lower extremity. As result, the patient was strictly admitted. Notably, this is the patient's fourth episode in the past 8 years, but he has not had an episode in nearly 6 years.   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 Johnnye Sima, ID, regarding prophylaxis.  He recommended Penicillin 563m daily after treatment of this cellulitis -Patient will likely need PICC line and IV antibiotics   Hypertension  -Continue ARB/HCTZ   Diabetes mellitus type 2  -Hemoglobin A1c 8.0 -Continue ISS and Levemir 18 units at  bedtime  Hyperlipidemia -Continue statin  CKD stage III -Baseline creatinine 1.3-1.6  Code Status: Full  Family Communication: None at bedside  Disposition Plan: Admitted.  Continue IV antibiotics.    Time Spent in minutes   30 minutes  Procedures  None  Consults   Orthopedics, Dr. DSharol Given DVT Prophylaxis  Lovenox  Lab Results  Component Value Date   PLT 408* 11/12/2014    Medications  Scheduled Meds: . acitretin  25 mg Oral QODAY  . allopurinol  100 mg Oral Daily  . atorvastatin  20 mg Oral QHS  . buPROPion  150 mg Oral Daily  . enoxaparin (LOVENOX) injection  40 mg Subcutaneous Q24H  . fenofibrate  160 mg Oral QODAY  . irbesartan  300 mg Oral Daily   And  . hydrochlorothiazide  25 mg Oral Daily  . insulin aspart  0-15 Units Subcutaneous TID WC  . insulin detemir  18 Units Subcutaneous QHS  . lubriderm seriously sensitive  1 application Topical QID  . saccharomyces boulardii  250 mg Oral BID  . vancomycin  1,500 mg Intravenous Q24H   Continuous Infusions:  PRN Meds:.HYDROcodone-acetaminophen, zolpidem  Antibiotics    Anti-infectives    Start     Dose/Rate Route Frequency Ordered Stop   11/11/14 2200  vancomycin (VANCOCIN) 1,500 mg in sodium  chloride 0.9 % 500 mL IVPB     1,500 mg 250 mL/hr over 120 Minutes Intravenous Every 24 hours 11/10/14 2109     11/11/14 0600  piperacillin-tazobactam (ZOSYN) IVPB 3.375 g  Status:  Discontinued     3.375 g 12.5 mL/hr over 240 Minutes Intravenous 3 times per day 11/10/14 2107 11/11/14 2054   11/10/14 2000  vancomycin (VANCOCIN) 1,500 mg in sodium chloride 0.9 % 500 mL IVPB     1,500 mg 250 mL/hr over 120 Minutes Intravenous  Once 11/10/14 1909 11/11/14 0014   11/10/14 1830  piperacillin-tazobactam (ZOSYN) IVPB 3.375 g     3.375 g 100 mL/hr over 30 Minutes Intravenous  Once 11/10/14 1823 11/10/14 2244        Subjective:   Goebel Hellums seen and examined today. Patient denies chest pain, abdominal pain,  shortness of breath.  Complains of right leg pain and swelling, but feels it is improving, however states there are other areas or "nodules popping up".     Objective:   Filed Vitals:   11/13/14 1300 11/13/14 1430 11/13/14 2000 11/14/14 0504  BP:  141/68 149/65 126/57  Pulse:  90 84 76  Temp:  98.1 F (36.7 C) 97.7 F (36.5 C) 97.8 F (36.6 C)  TempSrc:      Resp:  '16 16 16  ' Weight: 91 kg (200 lb 9.9 oz)     SpO2:  96% 95% 99%    Wt Readings from Last 3 Encounters:  11/13/14 91 kg (200 lb 9.9 oz)  10/30/14 91.3 kg (201 lb 4.5 oz)  07/23/13 92.534 kg (204 lb)     Intake/Output Summary (Last 24 hours) at 11/14/14 1141 Last data filed at 11/14/14 0900  Gross per 24 hour  Intake   1840 ml  Output      0 ml  Net   1840 ml    Exam  General: Well developed, well nourished, NAD  Cardiovascular: S1 S2 auscultated, RRR, no murmurs  Respiratory: Clear to auscultation  Abdomen: Soft, nontender, nondistended, + bowel sounds  Extremities: warm dry without cyanosis clubbing. RLE: erythema and edema (improving) from ankle to infrapatellar area, no open wounds or drainage, area of induration on right lateral area and right shin.   Neuro: AAOx3, Nonfocal  Psych: Appropriate mood and affect   Data Review   Micro Results No results found for this or any previous visit (from the past 240 hour(s)).  Radiology Reports Dg Tibia/fibula Right  11/10/2014   CLINICAL DATA:  Chronic recurring infection right tibia.  EXAM: RIGHT TIBIA AND FIBULA - 2 VIEW  COMPARISON:  03/09/2009, 08/15/2013  FINDINGS: There is soft tissue edema involving the calf. No acute fracture or subluxation. No radiopaque foreign body or soft tissue gas. No evidence for cortical erosion to indicate osteomyelitis.  IMPRESSION: Soft tissue swelling of the calf.   Electronically Signed   By: Nolon Nations M.D.   On: 11/10/2014 21:02   Mr Tibia Fibula Right W Wo Contrast  11/11/2014   CLINICAL DATA:  Right lower  extremity swelling, redness and tenderness.  EXAM: MRI OF LOWER RIGHT EXTREMITY WITHOUT AND WITH CONTRAST  TECHNIQUE: Multiplanar, multisequence MR imaging of the right was performed both before and after administration of intravenous contrast.  CONTRAST:  93m MULTIHANCE GADOBENATE DIMEGLUMINE 529 MG/ML IV SOLN  COMPARISON:  None.  FINDINGS: There is normal marrow signal. There is no acute fracture or subluxation. There is no periosteal reaction. There is no bone destruction.  There  is mild edema in the subcutaneous fat of the right lower leg with minimal enhancement. There is no focal fluid collection. There is no soft tissue mass or hematoma.  The muscles are normal in signal. The muscles enhance normally and homogeneously. The visualized flexor, extensor, peroneal and Achilles tendons are intact. The neurovascular bundles are normal.  IMPRESSION: 1. Mild edema in the subcutaneous fat of the right lower leg with minimal enhancement concerning for mild cellulitis. No drainable fluid collection to suggest an abscess. 2. No evidence of osteomyelitis.   Electronically Signed   By: Kathreen Devoid   On: 11/11/2014 09:22    CBC  Recent Labs Lab 11/10/14 1915 11/11/14 0710 11/12/14 0512  WBC 6.9 5.7 5.6  HGB 13.0 12.1* 11.9*  HCT 40.2 37.8* 36.6*  PLT 497* 407* 408*  MCV 84.8 84.8 83.8  MCH 27.4 27.1 27.2  MCHC 32.3 32.0 32.5  RDW 13.7 13.7 13.4    Chemistries   Recent Labs Lab 11/10/14 1915 11/11/14 0710 11/12/14 0512 11/14/14 0448  NA 133* 137 136 136  K 4.1 4.4 3.7 3.8  CL 101 104 102 100  CO2 '24 28 21 24  ' GLUCOSE 192* 170* 228* 214*  BUN 27* '23 15 19  ' CREATININE 1.66* 1.39* 1.35 1.46*  CALCIUM 9.4 9.0 9.3 9.8  AST 31  --   --   --   ALT 38  --   --   --   ALKPHOS 85  --   --   --   BILITOT 0.4  --   --   --    ------------------------------------------------------------------------------------------------------------------ estimated creatinine clearance is 53.2 mL/min (by C-G  formula based on Cr of 1.46). ------------------------------------------------------------------------------------------------------------------ No results for input(s): HGBA1C in the last 72 hours. ------------------------------------------------------------------------------------------------------------------ No results for input(s): CHOL, HDL, LDLCALC, TRIG, CHOLHDL, LDLDIRECT in the last 72 hours. ------------------------------------------------------------------------------------------------------------------ No results for input(s): TSH, T4TOTAL, T3FREE, THYROIDAB in the last 72 hours.  Invalid input(s): FREET3 ------------------------------------------------------------------------------------------------------------------ No results for input(s): VITAMINB12, FOLATE, FERRITIN, TIBC, IRON, RETICCTPCT in the last 72 hours.  Coagulation profile No results for input(s): INR, PROTIME in the last 168 hours.  No results for input(s): DDIMER in the last 72 hours.  Cardiac Enzymes No results for input(s): CKMB, TROPONINI, MYOGLOBIN in the last 168 hours.  Invalid input(s): CK ------------------------------------------------------------------------------------------------------------------ Invalid input(s): POCBNP    Keoni Havey D.O. on 11/14/2014 at 11:41 AM  Between 7am to 7pm - Pager - [phone]  After 7pm go to [email or web address] - password [email or web address]  And look for the night coverage person covering for me after hours  [facility] Office  [phone]

## 2014-11-14 NOTE — Progress Notes (Signed)
Patient ID: James DustJonathan S Roberson, male   DOB: [birth year], 69 y.o.   MRN: [id number] Patient cellulitis continues to resolve right calf. It is nontender to palpation at this time. Anticipate discharge on oral or IV antibiotics. I will follow-up in the office in 2 weeks.

## 2014-11-15 LAB — BASIC METABOLIC PANEL
Anion gap: 9 (ref 5–15)
BUN: 22 mg/dL (ref 6–23)
CO2: 25 mmol/L (ref 19–32)
CREATININE: 1.51 mg/dL — AB (ref 0.50–1.35)
Calcium: 9.5 mg/dL (ref 8.4–10.5)
Chloride: 101 mmol/L (ref 96–112)
GFR calc Af Amer: 53 mL/min — ABNORMAL LOW (ref >90)
GFR, EST NON AFRICAN AMERICAN: 45 mL/min — AB (ref >90)
Glucose, Bld: 201 mg/dL — ABNORMAL HIGH (ref 70–99)
POTASSIUM: 4 mmol/L (ref 3.5–5.1)
Sodium: 135 mmol/L (ref 135–145)

## 2014-11-15 LAB — CBC
HCT: 38.5 % — ABNORMAL LOW (ref 39.0–52.0)
HEMOGLOBIN: 12.5 g/dL — AB (ref 13.0–17.0)
MCH: 27.4 pg (ref 26.0–34.0)
MCHC: 32.5 g/dL (ref 30.0–36.0)
MCV: 84.4 fL (ref 78.0–100.0)
Platelets: 432 10*3/uL — ABNORMAL HIGH (ref 150–400)
RBC: 4.56 MIL/uL (ref 4.22–5.81)
RDW: 13.4 % (ref 11.5–15.5)
WBC: 5.8 10*3/uL (ref 4.0–10.5)

## 2014-11-15 LAB — GLUCOSE, CAPILLARY
GLUCOSE-CAPILLARY: 175 mg/dL — AB (ref 70–99)
Glucose-Capillary: 188 mg/dL — ABNORMAL HIGH (ref 70–99)
Glucose-Capillary: 195 mg/dL — ABNORMAL HIGH (ref 70–99)
Glucose-Capillary: 166 mg/dL — ABNORMAL HIGH (ref 70–99)

## 2014-11-15 MED ORDER — INSULIN ASPART 100 UNIT/ML ~~LOC~~ SOLN
4.0000 [IU] | Freq: Three times a day (TID) | SUBCUTANEOUS | Status: DC
  Administered 2014-11-15 – 2014-11-17 (×8): 4 [IU] via SUBCUTANEOUS

## 2014-11-15 MED ORDER — SODIUM CHLORIDE 0.9 % IJ SOLN
10.0000 mL | INTRAMUSCULAR | Status: DC | PRN
  Administered 2014-11-16 – 2014-11-17 (×2): 10 mL
  Filled 2014-11-15 (×2): qty 40

## 2014-11-15 NOTE — Progress Notes (Addendum)
[facility]                                                                              Patient Demographics  James Roberson, is a 69 y.o. male, DOB - 1945/09/14, ZOX:[id number]  Admit date - 11/10/2014   Admitting Physician James Hartshorn, MD  Outpatient Primary MD for the patient is James Asters, MD  LOS - 5   No chief complaint on file.     Brief history 69 year old male with a history of diabetes mellitus, hypertension, hyperlipidemia, and palmar-plantar pustulosis presented with cellulitis strictly from James Roberson office. The patient was recently discharged from the hospital on 11/03/2014 for right lower extremity cellulitis. The patient received intravenous vancomycin and Zosyn at the time. He was discharged home on clindamycin. There was no significant improvement when he followed up with James Roberson on 11/07/14, so he was started on doxy on that day. On follow-up 11/10/14, there was not much improvement on the patient's lower extremity. As result, the patient was strictly admitted. Notably, this is the patient's fourth episode in the past 8 years, but he has not had an episode in nearly 6 years.   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   -Will order PICC line -Keep RLE elevated -Spoke with James Roberson, ID, regarding prophylaxis.  He recommended Penicillin  daily after treatment of this cellulitis -Spoke with ortho, James Roberson, plan to open pustule tomorrow and send for culture  Hypertension  -Stable, Continue ARB/HCTZ   Diabetes  mellitus type 2  -Hemoglobin A1c 8.0 -Continue ISS and Levemir 18 units at bedtime -Added 4units Novolog per meal (per DM coordinator)  Hyperlipidemia -Continue statin  CKD stage III -Stable, Baseline creatinine 1.3-1.6  Code Status: Full  Family Communication: None at bedside  Disposition Plan: Admitted.  Continue IV antibiotics.    Time Spent in minutes   30 minutes  Procedures  None  Consults   Orthopedics, James Roberson Infectious disease, James Roberson, via phone  DVT Prophylaxis  Lovenox  Lab Results  Component Value Date   PLT 432* 11/15/2014    Medications  Scheduled Meds: . acitretin  25 mg Oral QODAY  . allopurinol  100 mg Oral Daily  . atorvastatin  20 mg Oral QHS  . buPROPion  150 mg Oral Daily  . enoxaparin (LOVENOX) injection  40 mg Subcutaneous Q24H  . fenofibrate  160 mg Oral QODAY  . irbesartan  300 mg Oral Daily   And  . hydrochlorothiazide  25 mg Oral Daily  . insulin aspart  0-15 Units Subcutaneous TID WC  . insulin aspart  4 Units Subcutaneous TID WC  . insulin detemir  18 Units Subcutaneous QHS  . lubriderm seriously sensitive  1 application Topical QID  . saccharomyces boulardii  250 mg Oral BID  . vancomycin  1,500 mg Intravenous Q24H  Continuous Infusions:  PRN Meds:.HYDROcodone-acetaminophen, zolpidem  Antibiotics    Anti-infectives    Start     Dose/Rate Route Frequency Ordered Stop   11/11/14 2200  vancomycin (VANCOCIN) 1,500 mg in sodium chloride 0.9 % 500 mL IVPB     1,500 mg 250 mL/hr over 120 Minutes Intravenous Every 24 hours 11/10/14 2109     11/11/14 0600  piperacillin-tazobactam (ZOSYN) IVPB 3.375 g  Status:  Discontinued     3.375 g 12.5 mL/hr over 240 Minutes Intravenous 3 times per day 11/10/14 2107 11/11/14 2054   11/10/14 2000  vancomycin (VANCOCIN) 1,500 mg in sodium chloride 0.9 % 500 mL IVPB     1,500 mg 250 mL/hr over 120 Minutes Intravenous  Once 11/10/14 1909 11/11/14 0014   11/10/14 1830   piperacillin-tazobactam (ZOSYN) IVPB 3.375 g     3.375 g 100 mL/hr over 30 Minutes Intravenous  Once 11/10/14 1823 11/10/14 2244        Subjective:   James MedianJonathan Roberson seen and examined today. Patient feels the swelling and redness on his right leg has improved however has noticed an area that has "popped up and worried it will open up." Patient feels his leg pain has been the same however not worsened. Denies chest pain, abdominal pain, shortness of breath.    Objective:   Filed Vitals:   11/14/14 0504 11/14/14 1436 11/14/14 2024 11/15/14 0458  BP: 126/57 157/66 142/65 132/67  Pulse: 76 84 89 73  Temp: 97.8 F (36.6 C) 97.8 F (36.6 C) 98.2 F (36.8 C) 97.8 F (36.6 C)  TempSrc:  Oral Oral   Resp: 16 16 16 18   Weight:      SpO2: 99% 96% 96% 95%    Wt Readings from Last 3 Encounters:  11/13/14 91 kg (200 lb 9.9 oz)  10/30/14 91.3 kg (201 lb 4.5 oz)  07/23/13 92.534 kg (204 lb)     Intake/Output Summary (Last 24 hours) at 11/15/14 1242 Last data filed at 11/15/14 0757  Gross per 24 hour  Intake    480 ml  Output      0 ml  Net    480 ml    Exam  General: Well developed, well nourished  Cardiovascular: S1 S2 auscultated, RRR, no murmurs  Respiratory: Clear to auscultation  Abdomen: Soft, nontender, nondistended, + bowel sounds  Extremities: warm dry without cyanosis clubbing. RLE: erythema and edema - currently on mid shin.  Small pustular area noted- nondraining  Neuro: AAOx3, Nonfocal  Psych: Pleasant, Appropriate mood and affect  Data Review   Micro Results No results found for this or any previous visit (from the past 240 hour(s)).  Radiology Reports Dg Tibia/fibula Right  11/10/2014   CLINICAL DATA:  Chronic recurring infection right tibia.  EXAM: RIGHT TIBIA AND FIBULA - 2 VIEW  COMPARISON:  03/09/2009, 08/15/2013  FINDINGS: There is soft tissue edema involving the calf. No acute fracture or subluxation. No radiopaque foreign body or soft tissue gas.  No evidence for cortical erosion to indicate osteomyelitis.  IMPRESSION: Soft tissue swelling of the calf.   Electronically Signed   By: Norva PavlovElizabeth  Roberson M.D.   On: 11/10/2014 21:02   Mr Tibia Fibula Right W Wo Contrast  11/11/2014   CLINICAL DATA:  Right lower extremity swelling, redness and tenderness.  EXAM: MRI OF LOWER RIGHT EXTREMITY WITHOUT AND WITH CONTRAST  TECHNIQUE: Multiplanar, multisequence MR imaging of the right was performed both before and after administration of intravenous contrast.  CONTRAST:  10mL MULTIHANCE GADOBENATE DIMEGLUMINE 529 MG/ML IV SOLN  COMPARISON:  None.  FINDINGS: There is normal marrow signal. There is no acute fracture or subluxation. There is no periosteal reaction. There is no bone destruction.  There is mild edema in the subcutaneous fat of the right lower leg with minimal enhancement. There is no focal fluid collection. There is no soft tissue mass or hematoma.  The muscles are normal in signal. The muscles enhance normally and homogeneously. The visualized flexor, extensor, peroneal and Achilles tendons are intact. The neurovascular bundles are normal.  IMPRESSION: 1. Mild edema in the subcutaneous fat of the right lower leg with minimal enhancement concerning for mild cellulitis. No drainable fluid collection to suggest an abscess. 2. No evidence of osteomyelitis.   Electronically Signed   By: Elige Ko   On: 11/11/2014 09:22    CBC  Recent Labs Lab 11/10/14 1915 11/11/14 0710 11/12/14 0512 11/15/14 0610  WBC 6.9 5.7 5.6 5.8  HGB 13.0 12.1* 11.9* 12.5*  HCT 40.2 37.8* 36.6* 38.5*  PLT 497* 407* 408* 432*  MCV 84.8 84.8 83.8 84.4  MCH 27.4 27.1 27.2 27.4  MCHC 32.3 32.0 32.5 32.5  RDW 13.7 13.7 13.4 13.4    Chemistries   Recent Labs Lab 11/10/14 1915 11/11/14 0710 11/12/14 0512 11/14/14 0448 11/15/14 0610  NA 133* 137 136 136 135  K 4.1 4.4 3.7 3.8 4.0  CL 101 104 102 100 101  CO2 GLUCOSE 192* 170* 228* 214* 201*  BUN  27* CREATININE 1.66* 1.39* 1.35 1.46* 1.51*  CALCIUM 9.4 9.0 9.3 9.8 9.5  AST 31  --   --   --   --   ALT 38  --   --   --   --   ALKPHOS 85  --   --   --   --   BILITOT 0.4  --   --   --   --    ------------------------------------------------------------------------------------------------------------------ estimated creatinine clearance is 51.5 mL/min (by C-G formula based on Cr of 1.51). ------------------------------------------------------------------------------------------------------------------ No results for input(s): HGBA1C in the last 72 hours. ------------------------------------------------------------------------------------------------------------------ No results for input(s): CHOL, HDL, LDLCALC, TRIG, CHOLHDL, LDLDIRECT in the last 72 hours. ------------------------------------------------------------------------------------------------------------------ No results for input(s): TSH, T4TOTAL, T3FREE, THYROIDAB in the last 72 hours.  Invalid input(s): FREET3 ------------------------------------------------------------------------------------------------------------------ No results for input(s): VITAMINB12, FOLATE, FERRITIN, TIBC, IRON, RETICCTPCT in the last 72 hours.  Coagulation profile No results for input(s): INR, PROTIME in the last 168 hours.  No results for input(s): DDIMER in the last 72 hours.  Cardiac Enzymes No results for input(s): CKMB, TROPONINI, MYOGLOBIN in the last 168 hours.  Invalid input(s): CK ------------------------------------------------------------------------------------------------------------------ Invalid input(s): POCBNP    Imogean Ciampa D.O. on 11/15/2014 at 12:42 PM  Between 7am to 7pm - Pager - [phone]  After 7pm go to [email or web address] - password [email or web address]  And look for the night coverage person covering for me after hours  [facility] Office  [phone]

## 2014-11-15 NOTE — Progress Notes (Signed)
Peripherally Inserted Central Catheter/Midline Placement  The IV Nurse has discussed with the patient and/or persons authorized to consent for the patient, the purpose of this procedure and the potential benefits and risks involved with this procedure.  The benefits include less needle sticks, lab draws from the catheter and patient may be discharged home with the catheter.  Risks include, but not limited to, infection, bleeding, blood clot (thrombus formation), and puncture of an artery; nerve damage and irregular heat beat.  Alternatives to this procedure were also discussed.  PICC/Midline Placement Documentation  PICC / Midline Single Lumen 11/15/14 PICC Right Cephalic 38 cm 1 cm (Active)  Indication for Insertion or Continuance of Line Home intravenous therapies (PICC only) 11/15/2014  4:13 PM  Exposed Catheter (cm) 1 cm 11/15/2014  4:13 PM  Site Assessment Clean;Dry;Intact 11/15/2014  4:13 PM  Line Status Flushed;Saline locked;Blood return noted 11/15/2014  4:13 PM  Dressing Type Transparent 11/15/2014  4:13 PM  Dressing Status Clean;Dry;Intact;Antimicrobial disc in place 11/15/2014  4:13 PM  Line Care Connections checked and tightened 11/15/2014  4:13 PM  Line Adjustment (NICU/IV Team Only) No 11/15/2014  4:13 PM  Dressing Intervention New dressing 11/15/2014  4:13 PM  Dressing Change Due 11/22/14 11/15/2014  4:13 PM       James Roberson, James Roberson 11/15/2014, 4:13 PM

## 2014-11-16 LAB — GLUCOSE, CAPILLARY
GLUCOSE-CAPILLARY: 251 mg/dL — AB (ref 70–99)
Glucose-Capillary: 167 mg/dL — ABNORMAL HIGH (ref 70–99)
Glucose-Capillary: 168 mg/dL — ABNORMAL HIGH (ref 70–99)
Glucose-Capillary: 145 mg/dL — ABNORMAL HIGH (ref 70–99)

## 2014-11-16 LAB — VANCOMYCIN, TROUGH: Vancomycin Tr: 14.2 ug/mL (ref 10.0–20.0)

## 2014-11-16 MED ORDER — ZOLPIDEM TARTRATE 5 MG PO TABS
5.0000 mg | ORAL_TABLET | Freq: Every evening | ORAL | Status: DC | PRN
  Administered 2014-11-16: 5 mg via ORAL
  Filled 2014-11-16: qty 1

## 2014-11-16 NOTE — Progress Notes (Signed)
Patient ID: James DustJonathan S Roberson, male   DOB: 1945-10-25, 69 y.o.   MRN: [id number] Cellulitis in the right leg appears essentially resolved. I agree with a discharging on IV antibiotics. Patient is a very small 5 mm in diameter pustular lesion. After informed consent this was sterilely prepped anesthetized with 1 mL of 1% lidocaine plain a 10 blade knife was used to open the small pustule. There is a very small amount of the hematoma as well as very small amount of white material. This did appear consistent with infection. Cultures were not obtained due to the fact the patient has been on prolonged IV antibiotics. A sterile compressive dressing was applied. Patient will change the dressing daily starting tomorrow. I will follow-up in the office in 2 weeks.

## 2014-11-16 NOTE — Progress Notes (Signed)
[facility]                                                                              Patient Demographics  James MedianJonathan Roberson, is a 69 y.o. male, DOB - 12/29/1945, WUJ:[id number]  Admit date - 11/10/2014   Admitting Physician Catarina Hartshornavid Tat, MD  Outpatient Primary MD for the patient is Cliffton AstersJohn Campbell, MD  LOS - 6   No chief complaint on file.     Brief history 69 year old male with a history of diabetes mellitus, hypertension, hyperlipidemia, and palmar-plantar pustulosis presented with cellulitis strictly from Dr. Audrie Liauda's office. The patient was recently discharged from the hospital on 11/03/2014 for right lower extremity cellulitis. The patient received intravenous vancomycin and Zosyn at the time. He was discharged home on clindamycin. There was no significant improvement when he followed up with Dr. Lajoyce Cornersuda on 11/07/14, so he was started on doxy on that day. On follow-up 11/10/14, there was not much improvement on the patient's lower extremity. As result, the patient was strictly admitted. Notably, this is the patient's fourth episode in the past 8 years, but he has not had an episode in nearly 6 years.   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 Lajoyce Cornersuda- found small amount of white material and hematoma -dressing to be changed 4/11 -Continue vancomycin; will likely need IV antibiotics at discharge.   -PICC line in place -Keep RLE elevated -Spoke with Dr. Ninetta LightsHatcher, ID, regarding prophylaxis.  He recommended Penicillin 500mg  daily after treatment of this cellulitis -Follow up with Dr. Lajoyce Cornersuda  in 2 weeks -No cultures taken from I&D as patient has been on antibiotics  Hypertension  -Stable, Continue ARB/HCTZ   Diabetes mellitus type 2  -Hemoglobin A1c 8.0 -Continue ISS and Levemir 18 units at bedtime -Added 4units Novolog per meal (per DM coordinator)  Hyperlipidemia -Continue statin  CKD stage III -Stable, Baseline creatinine 1.3-1.6  Code Status: Full  Family Communication: None at bedside  Disposition Plan: Admitted.  Continue IV antibiotics, will likely need outpatient IV antibiotics  Time Spent in minutes   30 minutes  Procedures  None  Consults   Orthopedics, Dr. Lajoyce Cornersuda Infectious disease, Dr. Ninetta LightsHatcher, via phone  DVT Prophylaxis  Lovenox  Lab Results  Component Value Date   PLT 432* 11/15/2014    Medications  Scheduled Meds: . acitretin  25 mg Oral QODAY  . allopurinol  100 mg Oral Daily  . atorvastatin  20 mg Oral QHS  . buPROPion  150 mg Oral Daily  . enoxaparin (LOVENOX) injection  40 mg Subcutaneous Q24H  . fenofibrate  160 mg Oral QODAY  . irbesartan  300 mg Oral Daily   And  . hydrochlorothiazide  25 mg Oral Daily  . insulin aspart  0-15 Units Subcutaneous TID WC  . insulin aspart  4 Units Subcutaneous TID WC  . insulin detemir  18 Units  Subcutaneous QHS  . lubriderm seriously sensitive  1 application Topical QID  . saccharomyces boulardii  250 mg Oral BID  . vancomycin  1,500 mg Intravenous Q24H   Continuous Infusions:  PRN Meds:.HYDROcodone-acetaminophen, sodium chloride, zolpidem, zolpidem  Antibiotics    Anti-infectives    Start     Dose/Rate Route Frequency Ordered Stop   11/11/14 2200  vancomycin (VANCOCIN) 1,500 mg in sodium chloride 0.9 % 500 mL IVPB     1,500 mg 250 mL/hr over 120 Minutes Intravenous Every 24 hours 11/10/14 2109     11/11/14 0600  piperacillin-tazobactam (ZOSYN) IVPB 3.375 g  Status:  Discontinued     3.375 g 12.5 mL/hr over 240 Minutes Intravenous 3 times per day 11/10/14 2107 11/11/14 2054    11/10/14 2000  vancomycin (VANCOCIN) 1,500 mg in sodium chloride 0.9 % 500 mL IVPB     1,500 mg 250 mL/hr over 120 Minutes Intravenous  Once 11/10/14 1909 11/11/14 0014   11/10/14 1830  piperacillin-tazobactam (ZOSYN) IVPB 3.375 g     3.375 g 100 mL/hr over 30 Minutes Intravenous  Once 11/10/14 1823 11/10/14 2244        Subjective:   James Roberson seen and examined today. Patient was concerned with incision made this morning on his leg.  He denies any current pain.  Feels the redness has improved, however, continues to worry about worsening of his cellulitis and other places that may "popup".   Denies chest pain, abdominal pain, shortness of breath.    Objective:   Filed Vitals:   11/15/14 0458 11/15/14 1328 11/15/14 2100 11/16/14 0439  BP: 132/67 142/68 133/83 132/61  Pulse: 73 74 77 78  Temp: 97.8 F (36.6 C) 98.1 F (36.7 C) 98 F (36.7 C) 97.8 F (36.6 C)  TempSrc:  Oral Oral Oral  Resp: Weight:      SpO2: 95% 97% 98% 98%    Wt Readings from Last 3 Encounters:  11/13/14 91 kg (200 lb 9.9 oz)  10/30/14 91.3 kg (201 lb 4.5 oz)  07/23/13 92.534 kg (204 lb)     Intake/Output Summary (Last 24 hours) at 11/16/14 1045 Last data filed at 11/16/14 0440  Gross per 24 hour  Intake    720 ml  Output    300 ml  Net    420 ml    Exam  General: Well developed, well nourished  Cardiovascular: normal S1/S2, RRR, no murmurs  Respiratory: Clear to auscultation  Abdomen: Soft, nontender, nondistended, + bowel sounds  Extremities: warm dry without cyanosis clubbing. RLE- wrapped.  Neuro: AAOx3, Nonfocal  Psych: Appropriate mood and affect  Data Review   Micro Results No results found for this or any previous visit (from the past 240 hour(s)).  Radiology Reports Dg Tibia/fibula Right  11/10/2014   CLINICAL DATA:  Chronic recurring infection right tibia.  EXAM: RIGHT TIBIA AND FIBULA - 2 VIEW  COMPARISON:  03/09/2009, 08/15/2013  FINDINGS: There is soft  tissue edema involving the calf. No acute fracture or subluxation. No radiopaque foreign body or soft tissue gas. No evidence for cortical erosion to indicate osteomyelitis.  IMPRESSION: Soft tissue swelling of the calf.   Electronically Signed   By: Norva Pavlov M.D.   On: 11/10/2014 21:02   Mr Tibia Fibula Right W Wo Contrast  11/11/2014   CLINICAL DATA:  Right lower extremity swelling, redness and tenderness.  EXAM: MRI OF LOWER RIGHT EXTREMITY WITHOUT AND WITH CONTRAST  TECHNIQUE: Multiplanar, multisequence  MR imaging of the right was performed both before and after administration of intravenous contrast.  CONTRAST:  10mL MULTIHANCE GADOBENATE DIMEGLUMINE 529 MG/ML IV SOLN  COMPARISON:  None.  FINDINGS: There is normal marrow signal. There is no acute fracture or subluxation. There is no periosteal reaction. There is no bone destruction.  There is mild edema in the subcutaneous fat of the right lower leg with minimal enhancement. There is no focal fluid collection. There is no soft tissue mass or hematoma.  The muscles are normal in signal. The muscles enhance normally and homogeneously. The visualized flexor, extensor, peroneal and Achilles tendons are intact. The neurovascular bundles are normal.  IMPRESSION: 1. Mild edema in the subcutaneous fat of the right lower leg with minimal enhancement concerning for mild cellulitis. No drainable fluid collection to suggest an abscess. 2. No evidence of osteomyelitis.   Electronically Signed   By: Elige Ko   On: 11/11/2014 09:22    CBC  Recent Labs Lab 11/10/14 1915 11/11/14 0710 11/12/14 0512 11/15/14 0610  WBC 6.9 5.7 5.6 5.8  HGB 13.0 12.1* 11.9* 12.5*  HCT 40.2 37.8* 36.6* 38.5*  PLT 497* 407* 408* 432*  MCV 84.8 84.8 83.8 84.4  MCH 27.4 27.1 27.2 27.4  MCHC 32.3 32.0 32.5 32.5  RDW 13.7 13.7 13.4 13.4    Chemistries   Recent Labs Lab 11/10/14 1915 11/11/14 0710 11/12/14 0512 11/14/14 0448 11/15/14 0610  NA 133* 137 136 136  135  K 4.1 4.4 3.7 3.8 4.0  CL 101 104 102 100 101  CO2 GLUCOSE 192* 170* 228* 214* 201*  BUN 27* CREATININE 1.66* 1.39* 1.35 1.46* 1.51*  CALCIUM 9.4 9.0 9.3 9.8 9.5  AST 31  --   --   --   --   ALT 38  --   --   --   --   ALKPHOS 85  --   --   --   --   BILITOT 0.4  --   --   --   --    ------------------------------------------------------------------------------------------------------------------ estimated creatinine clearance is 51.5 mL/min (by C-G formula based on Cr of 1.51). ------------------------------------------------------------------------------------------------------------------ No results for input(s): HGBA1C in the last 72 hours. ------------------------------------------------------------------------------------------------------------------ No results for input(s): CHOL, HDL, LDLCALC, TRIG, CHOLHDL, LDLDIRECT in the last 72 hours. ------------------------------------------------------------------------------------------------------------------ No results for input(s): TSH, T4TOTAL, T3FREE, THYROIDAB in the last 72 hours.  Invalid input(s): FREET3 ------------------------------------------------------------------------------------------------------------------ No results for input(s): VITAMINB12, FOLATE, FERRITIN, TIBC, IRON, RETICCTPCT in the last 72 hours.  Coagulation profile No results for input(s): INR, PROTIME in the last 168 hours.  No results for input(s): DDIMER in the last 72 hours.  Cardiac Enzymes No results for input(s): CKMB, TROPONINI, MYOGLOBIN in the last 168 hours.  Invalid input(s): CK ------------------------------------------------------------------------------------------------------------------ Invalid input(s): POCBNP    Jeraldin Fesler D.O. on 11/16/2014 at 10:45 AM  Between 7am to 7pm - Pager - [phone]  After 7pm go to [email or web address] - password [email or web address]  And look for the night coverage person  covering for me after hours  [facility] Office  [phone]

## 2014-11-16 NOTE — Progress Notes (Signed)
ANTIBIOTIC CONSULT NOTE - FOLLOW UP  Pharmacy Consult for vancomycin Indication: cellulitis   Labs:  Recent Labs  11/14/14 0448 11/15/14 0610  WBC  --  5.8  HGB  --  12.5*  PLT  --  432*  CREATININE 1.46* 1.51*   Estimated Creatinine Clearance: 51.5 mL/min (by C-G formula based on Cr of 1.51).  Recent Labs  11/15/14 2224  VANCOTROUGH 14.2     Microbiology: Recent Results (from the past 720 hour(s))  Blood culture (routine x 2)     Status: None   Collection Time: 10/30/14 12:30 AM  Result Value Ref Range Status   Specimen Description BLOOD RIGHT ARM  Final   Special Requests BOTTLES DRAWN AEROBIC AND ANAEROBIC 5 CC  Final   Culture   Final    NO GROWTH 5 DAYS [facility]    Report Status 11/05/2014 FINAL  Final  Blood culture (routine x 2)     Status: None   Collection Time: 10/30/14 12:34 AM  Result Value Ref Range Status   Specimen Description BLOOD LEFT UPPER ARM  Final   Special Requests BOTTLES DRAWN AEROBIC AND ANAEROBIC 5 CC  Final   Culture   Final    NO GROWTH 5 DAYS Performed at [facility]    Report Status 11/05/2014 FINAL  Final     Assessment/Plan:  69yo male therapeutic on vancomycin with initial dosing for cellulitis.  Will continue vancomycin and monitor.  Vernard GamblesVeronda Miri Jose, PharmD, BCPS  11/16/2014,2:08 AM

## 2014-11-17 DIAGNOSIS — I129 Hypertensive chronic kidney disease with stage 1 through stage 4 chronic kidney disease, or unspecified chronic kidney disease: Secondary | ICD-10-CM | POA: Diagnosis not present

## 2014-11-17 DIAGNOSIS — L03115 Cellulitis of right lower limb: Secondary | ICD-10-CM | POA: Diagnosis not present

## 2014-11-17 DIAGNOSIS — N183 Chronic kidney disease, stage 3 (moderate): Secondary | ICD-10-CM | POA: Diagnosis not present

## 2014-11-17 DIAGNOSIS — E785 Hyperlipidemia, unspecified: Secondary | ICD-10-CM | POA: Diagnosis not present

## 2014-11-17 DIAGNOSIS — Z452 Encounter for adjustment and management of vascular access device: Secondary | ICD-10-CM | POA: Diagnosis not present

## 2014-11-17 DIAGNOSIS — E1165 Type 2 diabetes mellitus with hyperglycemia: Secondary | ICD-10-CM | POA: Diagnosis not present

## 2014-11-17 LAB — BASIC METABOLIC PANEL
Anion gap: 6 (ref 5–15)
BUN: 23 mg/dL (ref 6–23)
CHLORIDE: 100 mmol/L (ref 96–112)
CO2: 28 mmol/L (ref 19–32)
Calcium: 9.4 mg/dL (ref 8.4–10.5)
Creatinine, Ser: 1.41 mg/dL — ABNORMAL HIGH (ref 0.50–1.35)
GFR calc Af Amer: 57 mL/min — ABNORMAL LOW (ref >90)
GFR calc non Af Amer: 49 mL/min — ABNORMAL LOW (ref >90)
GLUCOSE: 190 mg/dL — AB (ref 70–99)
Potassium: 3.8 mmol/L (ref 3.5–5.1)
Sodium: 134 mmol/L — ABNORMAL LOW (ref 135–145)

## 2014-11-17 LAB — CBC
HEMATOCRIT: 39.3 % (ref 39.0–52.0)
Hemoglobin: 13 g/dL (ref 13.0–17.0)
MCH: 27.8 pg (ref 26.0–34.0)
MCHC: 33.1 g/dL (ref 30.0–36.0)
MCV: 84 fL (ref 78.0–100.0)
Platelets: 406 10*3/uL — ABNORMAL HIGH (ref 150–400)
RBC: 4.68 MIL/uL (ref 4.22–5.81)
RDW: 13.5 % (ref 11.5–15.5)
WBC: 6.2 10*3/uL (ref 4.0–10.5)

## 2014-11-17 LAB — GLUCOSE, CAPILLARY
GLUCOSE-CAPILLARY: 242 mg/dL — AB (ref 70–99)
Glucose-Capillary: 173 mg/dL — ABNORMAL HIGH (ref 70–99)

## 2014-11-17 MED ORDER — HEPARIN SOD (PORK) LOCK FLUSH 100 UNIT/ML IV SOLN
250.0000 [IU] | INTRAVENOUS | Status: AC | PRN
  Administered 2014-11-17: 250 [IU]

## 2014-11-17 MED ORDER — VANCOMYCIN HCL 10 G IV SOLR: 1500.0000 mg | INTRAVENOUS | Status: DC

## 2014-11-17 NOTE — Discharge Summary (Signed)
Physician Discharge Summary  James Roberson:[id number] DOB: 1945/09/25 DOA: 11/10/2014  PCP: Cliffton Asters, MD  Admit date: 11/10/2014 Discharge date: 11/17/2014  Time spent: 45 minutes  Recommendations for Outpatient Follow-up:  Patient will be discharged to home with home health.  Patient will need to follow up with primary care provider within one week of discharge.  Patient will also need to follow-up with Dr. Lajoyce Corners within 2 weeks of discharge Patient should continue medications as prescribed.  Patient should follow a Heart healthy carb modified diet. Continue wound care.  Discharge Diagnoses:  Cellulitis of the right lower extremity Hypertension Diabetes mellitus, type II Hyperlipidemia CKD, stage III  Discharge Condition: Stable  Diet recommendation: Heart healthy carb modified  Filed Weights   11/13/14 1300  Weight: 91 kg (200 lb 9.9 oz)    History of present illness:  By Dr. Albertine Grates on 11/10/2014 James Roberson is a 69 y.o. male  Was sent from orthopedics clinic for direct admission due to recurrent right lower extremity cellulitis. Patient was recently was admitted to the hospital for the same, received vanc/zosyn and was discharged on clinda, this was later changed to doxycycline due to persistent cellulitis, however, right lower extremity cellulitis persist with increase pain on ambulation, he was seen by Dr. Lajoyce Corners and was sent to the hospital for direct admission for recurrent right lower extremity cellulitis. Patient currently sitting in chair, in good spirit, nontoxic, he denies headache, no chest pain, no sob, no fever, no n/v, no diarrhea. He does has history of multiple operations to right lower extremity, h/o diabetes and psoriasis.  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 Lajoyce Corners- found small amount of white material and hematoma -dressing to be changed 4/11; wound care consulted- commended dry dressing, secured with paper tape, change daily -Continue vancomycin; will likely need IV antibiotics at discharge.  -PICC line in place -Spoke with Dr. Ninetta Lights, ID, regarding prophylaxis. He recommended Penicillin  daily after treatment of this cellulitis -Follow up with Dr. Lajoyce Corners in 2 weeks -No cultures taken from I&D as patient has been on antibiotics -Recommend patient follow-up with dermatologist and infectious disease -Will have patient follow up with Dr. Orvan Falconer in 2 weeks to discuss penicillin.  Hypertension  -Stable, Continue ARB/HCTZ   Diabetes mellitus type 2  -Hemoglobin A1c 8.0 -Continue ISS and Levemir 18 units at bedtime -Added 4units Novolog per meal (per DM coordinator) during hospitalization -Continue home regimen upon discharge  Hyperlipidemia -Continue statin  CKD stage III -Stable, Baseline creatinine 1.3-1.6  Procedures  None  Consults  Orthopedics, Dr. Lajoyce Corners Infectious disease, Dr. Ninetta Lights, via phone  Discharge Exam: Filed Vitals:   11/17/14 0603  BP: 128/87  Pulse: 78  Temp: 97.6 F (36.4 C)  Resp: 18   Exam  General: Well developed, well nourished, NAD  Cardiovascular: normal S1/S2, RRR, no murmurs  Respiratory: Clear to auscultation  Abdomen: Soft, nontender, nondistended, + bowel sounds  Extremities: warm dry without cyanosis clubbing. RLE- small probably 1 cm incision, small amount of blood, no purulent drainage noted. No further erythema or edema of the right lower extremity.  Neuro: AAOx3, Nonfocal  Psych: Appropriate mood and affect, pleasant  Discharge Instructions        Discharge Instructions    Discharge instructions    Complete by:  As directed   Patient will be discharged to home with home health.  Patient will need to follow up with primary care provider within one week of discharge.  Patient will also need to follow-up with Dr. Lajoyce Cornersuda within 2 weeks of discharge Patient should continue medications as prescribed.  Patient should follow a Heart healthy carb modified diet. Continue wound care.            Medication List    STOP taking these medications        clindamycin 150 MG capsule  Commonly known as:  CLEOCIN     doxycycline 100 MG capsule  Commonly known as:  VIBRAMYCIN      TAKE these medications        allopurinol 100 MG tablet  Commonly known as:  ZYLOPRIM  Take 100 mg by mouth daily.     atorvastatin 40 MG tablet  Commonly known as:  LIPITOR  Take 20 mg by mouth at bedtime.     buPROPion 150 MG 24 hr tablet  Commonly known as:  WELLBUTRIN XL  Take 150 mg by mouth daily.     HYDROcodone-acetaminophen 5-325 MG per tablet  Commonly known as:  NORCO/VICODIN  Take 1 tablet by mouth at bedtime.     insulin detemir 100 UNIT/ML injection  Commonly known as:  LEVEMIR  Inject 0.18 mLs (18 Units total) into the skin at bedtime.     lubriderm seriously sensitive Lotn  Apply 1 application topically 2 (two) times daily.     saccharomyces boulardii 250 MG capsule  Commonly known as:  FLORASTOR  Take 1 capsule (250 mg total) by mouth 2 (two) times daily.     sitaGLIPtin 100 MG tablet  Commonly known as:  JANUVIA  Take 50 mg by mouth at bedtime.     SORIATANE 25 MG capsule  Generic drug:  acitretin  Take 25 mg by mouth every other day. For psoriasis     TRILIPIX 135 MG capsule  Generic drug:  Choline Fenofibrate  Take 135 mg by mouth every other day.     valsartan-hydrochlorothiazide 320-25 MG per tablet  Commonly known as:  DIOVAN-HCT  Take 1 tablet by mouth daily.     vancomycin 1,500 mg in sodium chloride 0.9 % 500 mL   Inject 1,500 mg into the vein daily.     zolpidem 10 MG tablet  Commonly known as:  AMBIEN  Take 10 mg by mouth at bedtime.       Allergies  Allergen Reactions  . Avelox Abc [Moxifloxacin] Anaphylaxis  . Tape Rash    Please use paper tape  . Ambien Cr [Zolpidem Tartrate Er] Other (See Comments)     Hallucinations- name brand only - tolerates generic  . Ambien [Zolpidem] Other (See Comments)     Hallucinations - name brand only - tolerates generic  . Naproxen Other (See Comments)    Caused nose bleeds   Follow-up Information    Follow up with DUDA,MARCUS V, MD In 2 weeks.   Specialty:  Orthopedic Surgery   Contact information:   [address] [place] [address] [phone]       Follow up with Cliffton AstersJohn Campbell, MD. Schedule an appointment as soon as possible for a visit in 1 week.   Specialty:  Infectious Diseases   Why:  Hospital follow up  Contact information:   [address] [place] [address] [phone]        The results of significant diagnostics from this hospitalization (including imaging, microbiology, ancillary and laboratory) are listed below for reference.    Significant Diagnostic Studies: Dg Tibia/fibula Right  11/10/2014   CLINICAL DATA:  Chronic recurring infection right tibia.  EXAM: RIGHT TIBIA AND FIBULA - 2 VIEW  COMPARISON:  03/09/2009, 08/15/2013  FINDINGS: There is soft tissue edema involving the calf. No acute fracture or subluxation. No radiopaque foreign body or soft tissue gas. No evidence for cortical erosion to indicate osteomyelitis.  IMPRESSION: Soft tissue swelling of the calf.   Electronically Signed   By: Norva Pavlov M.D.   On: 11/10/2014 21:02   Mr Tibia Fibula Right W Wo Contrast  11/11/2014   CLINICAL DATA:  Right lower extremity swelling, redness and tenderness.  EXAM: MRI OF LOWER RIGHT EXTREMITY WITHOUT AND WITH CONTRAST  TECHNIQUE: Multiplanar, multisequence MR imaging of the right was performed  both before and after administration of intravenous contrast.  CONTRAST:  10mL MULTIHANCE GADOBENATE DIMEGLUMINE 529 MG/ML IV SOLN  COMPARISON:  None.  FINDINGS: There is normal marrow signal. There is no acute fracture or subluxation. There is no periosteal reaction. There is no bone destruction.  There is mild edema in the subcutaneous fat of the right lower leg with minimal enhancement. There is no focal fluid collection. There is no soft tissue mass or hematoma.  The muscles are normal in signal. The muscles enhance normally and homogeneously. The visualized flexor, extensor, peroneal and Achilles tendons are intact. The neurovascular bundles are normal.  IMPRESSION: 1. Mild edema in the subcutaneous fat of the right lower leg with minimal enhancement concerning for mild cellulitis. No drainable fluid collection to suggest an abscess. 2. No evidence of osteomyelitis.   Electronically Signed   By: Elige Ko   On: 11/11/2014 09:22    Microbiology: No results found for this or any previous visit (from the past 240 hour(s)).   Labs: Basic Metabolic Panel:  Recent Labs Lab 11/11/14 0710 11/12/14 0512 11/14/14 0448 11/15/14 0610 11/17/14 0515  NA 137 136 136 135 134*  K 4.4 3.7 3.8 4.0 3.8  CL 104 102 100 101 100  CO2 28 21 24 25 28   GLUCOSE 170* 228* 214* 201* 190*  BUN 23 15 19 22 23   CREATININE 1.39* 1.35 1.46* 1.51* 1.41*  CALCIUM 9.0 9.3 9.8 9.5 9.4   Liver Function Tests:  Recent Labs Lab 11/10/14 1915  AST 31  ALT 38  ALKPHOS 85  BILITOT 0.4  PROT 6.8  ALBUMIN 3.4*   No results for input(s): LIPASE, AMYLASE in the last 168 hours. No results for input(s): AMMONIA in the last 168 hours. CBC:  Recent Labs Lab 11/10/14 1915 11/11/14 0710 11/12/14 0512 11/15/14 0610 11/17/14 0515  WBC 6.9 5.7 5.6 5.8 6.2  HGB 13.0 12.1* 11.9* 12.5* 13.0  HCT 40.2 37.8* 36.6* 38.5* 39.3  MCV 84.8 84.8 83.8 84.4 84.0  PLT 497* 407* 408* 432* 406*   Cardiac Enzymes: No results  for input(s): CKTOTAL, CKMB, CKMBINDEX, TROPONINI in the last 168 hours. BNP: BNP (last 3 results) No results for input(s): BNP in the last 8760 hours.  ProBNP (last 3 results) No results for input(s): PROBNP in the last 8760 hours.  CBG:  Recent Labs Lab 11/16/14 0629 11/16/14 1151 11/16/14 1626 11/16/14 2132 11/17/14 0631  GLUCAP 167* 251* 168* 145* 173*  SignedEdsel Petrin  [facility] 11/17/2014, 10:41 AM

## 2014-11-17 NOTE — Care Management Note (Signed)
CARE MANAGEMENT NOTE 11/17/2014  Patient:  James Roberson,James Roberson   Account Number:  [id number]  Date Initiated:  11/17/2014  Documentation initiated by:  Vance PeperBRADY,Avya Flavell  Subjective/Objective Assessment:   [age] yr old male admitted with worsening cellulitis of right calf.     Action/Plan:   Case manger spoke with patient concerning need for Home Health for IV vancomycin. Choice offered. [facility] has used [facility] in past. [place] [place] [facility] [place]. [facility]   Anticipated DC Date:  11/17/2014   Anticipated DC Plan:  HOME W HOME HEALTH SERVICES      DC Planning Services  CM consult      [facility]   Choice offered to / List presented to:  C-1 Patient        HH arranged  HH-1 RN  IV Antibiotics      HH agency  [facility]   Status of service:  Completed, signed off Medicare Important Message given?  YES (If response is "NO", the following Medicare IM given date fields will be blank) Date Medicare IM given:  11/17/2014 Medicare IM given by:  Vance PeperBRADY,Todd Jelinski Date Additional Medicare IM given:   Additional Medicare IM given by:    Discharge Disposition:  HOME W HOME HEALTH SERVICES  Per UR Regulation:  Reviewed for med. necessity/level of care/duration of stay

## 2014-11-17 NOTE — Consult Note (Signed)
WOC wound consult note Reason for Consult: right lower extremity wound.  Dr. Lajoyce Cornersuda performed I&D 11/16/14, orders for dry dressing.  Wound type: S/P I & D Measurement: less than 0.1 cm incision, no depth appreciated today at the time of my assessment Wound HQI:ONGEXBbed:unable to visualize Drainage (amount, consistency, odor) scant, serosanguinous on dry dressing. Periwound: some erythema, per patient improved Dressing procedure/placement/frequency: Continue dry dressing, secure with paper tape (patient has sensitivity to other tapes), change daily.  Ok to remove dressing for shower and replace.    Discussed POC with patient and bedside nurse.  Re consult if needed, will not follow at this time. Thanks  Sue Fernicola [employer] RN, CWOCN [phone])

## 2014-11-17 NOTE — Discharge Instructions (Signed)

## 2014-11-20 DIAGNOSIS — I129 Hypertensive chronic kidney disease with stage 1 through stage 4 chronic kidney disease, or unspecified chronic kidney disease: Secondary | ICD-10-CM | POA: Diagnosis not present

## 2014-11-20 DIAGNOSIS — N183 Chronic kidney disease, stage 3 (moderate): Secondary | ICD-10-CM | POA: Diagnosis not present

## 2014-11-20 DIAGNOSIS — Z452 Encounter for adjustment and management of vascular access device: Secondary | ICD-10-CM | POA: Diagnosis not present

## 2014-11-20 DIAGNOSIS — L03115 Cellulitis of right lower limb: Secondary | ICD-10-CM | POA: Diagnosis not present

## 2014-11-20 DIAGNOSIS — E785 Hyperlipidemia, unspecified: Secondary | ICD-10-CM | POA: Diagnosis not present

## 2014-11-20 DIAGNOSIS — E1165 Type 2 diabetes mellitus with hyperglycemia: Secondary | ICD-10-CM | POA: Diagnosis not present

## 2014-11-24 DIAGNOSIS — E1165 Type 2 diabetes mellitus with hyperglycemia: Secondary | ICD-10-CM | POA: Diagnosis not present

## 2014-11-24 DIAGNOSIS — N183 Chronic kidney disease, stage 3 (moderate): Secondary | ICD-10-CM | POA: Diagnosis not present

## 2014-11-24 DIAGNOSIS — E785 Hyperlipidemia, unspecified: Secondary | ICD-10-CM | POA: Diagnosis not present

## 2014-11-24 DIAGNOSIS — Z452 Encounter for adjustment and management of vascular access device: Secondary | ICD-10-CM | POA: Diagnosis not present

## 2014-11-24 DIAGNOSIS — I129 Hypertensive chronic kidney disease with stage 1 through stage 4 chronic kidney disease, or unspecified chronic kidney disease: Secondary | ICD-10-CM | POA: Diagnosis not present

## 2014-11-24 DIAGNOSIS — L03115 Cellulitis of right lower limb: Secondary | ICD-10-CM | POA: Diagnosis not present

## 2014-11-25 DIAGNOSIS — I87321 Chronic venous hypertension (idiopathic) with inflammation of right lower extremity: Secondary | ICD-10-CM | POA: Diagnosis not present

## 2014-11-25 DIAGNOSIS — L02415 Cutaneous abscess of right lower limb: Secondary | ICD-10-CM | POA: Diagnosis not present

## 2014-12-01 DIAGNOSIS — I129 Hypertensive chronic kidney disease with stage 1 through stage 4 chronic kidney disease, or unspecified chronic kidney disease: Secondary | ICD-10-CM | POA: Diagnosis not present

## 2014-12-01 DIAGNOSIS — E1165 Type 2 diabetes mellitus with hyperglycemia: Secondary | ICD-10-CM | POA: Diagnosis not present

## 2014-12-01 DIAGNOSIS — N183 Chronic kidney disease, stage 3 (moderate): Secondary | ICD-10-CM | POA: Diagnosis not present

## 2014-12-01 DIAGNOSIS — Z452 Encounter for adjustment and management of vascular access device: Secondary | ICD-10-CM | POA: Diagnosis not present

## 2014-12-01 DIAGNOSIS — E785 Hyperlipidemia, unspecified: Secondary | ICD-10-CM | POA: Diagnosis not present

## 2014-12-01 DIAGNOSIS — L03115 Cellulitis of right lower limb: Secondary | ICD-10-CM | POA: Diagnosis not present

## 2014-12-08 DIAGNOSIS — E119 Type 2 diabetes mellitus without complications: Secondary | ICD-10-CM | POA: Diagnosis not present

## 2014-12-16 DIAGNOSIS — I87321 Chronic venous hypertension (idiopathic) with inflammation of right lower extremity: Secondary | ICD-10-CM | POA: Diagnosis not present

## 2014-12-23 DIAGNOSIS — L409 Psoriasis, unspecified: Secondary | ICD-10-CM | POA: Diagnosis not present

## 2015-01-06 DIAGNOSIS — E118 Type 2 diabetes mellitus with unspecified complications: Secondary | ICD-10-CM | POA: Diagnosis not present

## 2015-01-13 DIAGNOSIS — L309 Dermatitis, unspecified: Secondary | ICD-10-CM | POA: Diagnosis not present

## 2015-01-13 DIAGNOSIS — E118 Type 2 diabetes mellitus with unspecified complications: Secondary | ICD-10-CM | POA: Diagnosis not present

## 2015-01-13 DIAGNOSIS — E789 Disorder of lipoprotein metabolism, unspecified: Secondary | ICD-10-CM | POA: Diagnosis not present

## 2015-01-13 DIAGNOSIS — I10 Essential (primary) hypertension: Secondary | ICD-10-CM | POA: Diagnosis not present

## 2015-04-08 DIAGNOSIS — E118 Type 2 diabetes mellitus with unspecified complications: Secondary | ICD-10-CM | POA: Diagnosis not present

## 2015-04-15 DIAGNOSIS — F5101 Primary insomnia: Secondary | ICD-10-CM | POA: Diagnosis not present

## 2015-04-15 DIAGNOSIS — I10 Essential (primary) hypertension: Secondary | ICD-10-CM | POA: Diagnosis not present

## 2015-04-15 DIAGNOSIS — E118 Type 2 diabetes mellitus with unspecified complications: Secondary | ICD-10-CM | POA: Diagnosis not present

## 2015-07-22 DIAGNOSIS — Z23 Encounter for immunization: Secondary | ICD-10-CM | POA: Diagnosis not present

## 2015-10-14 DIAGNOSIS — E118 Type 2 diabetes mellitus with unspecified complications: Secondary | ICD-10-CM | POA: Diagnosis not present

## 2015-10-21 DIAGNOSIS — E118 Type 2 diabetes mellitus with unspecified complications: Secondary | ICD-10-CM | POA: Diagnosis not present

## 2015-10-21 DIAGNOSIS — R51 Headache: Secondary | ICD-10-CM | POA: Diagnosis not present

## 2015-10-21 DIAGNOSIS — I10 Essential (primary) hypertension: Secondary | ICD-10-CM | POA: Diagnosis not present

## 2015-10-22 ENCOUNTER — Other Ambulatory Visit: Payer: Self-pay | Admitting: Endocrinology

## 2015-10-22 DIAGNOSIS — R519 Headache, unspecified: Secondary | ICD-10-CM

## 2015-10-22 DIAGNOSIS — R51 Headache: Principal | ICD-10-CM

## 2015-10-29 ENCOUNTER — Ambulatory Visit
Admission: RE | Admit: 2015-10-29 | Discharge: 2015-10-29 | Disposition: A | Discharge status: Home / Self Care | Payer: Medicare Other | Source: Ambulatory Visit | Attending: Endocrinology | Admitting: Endocrinology

## 2015-10-29 DIAGNOSIS — R519 Headache, unspecified: Secondary | ICD-10-CM

## 2015-10-29 DIAGNOSIS — R42 Dizziness and giddiness: Secondary | ICD-10-CM | POA: Diagnosis not present

## 2015-10-29 DIAGNOSIS — R51 Headache: Principal | ICD-10-CM

## 2015-10-29 MED ORDER — IOPAMIDOL (ISOVUE-300) INJECTION 61%
75.0000 mL | Freq: Once | INTRAVENOUS | Status: AC | PRN
  Administered 2015-10-29: 75 mL via INTRAVENOUS

## 2015-12-16 DIAGNOSIS — E119 Type 2 diabetes mellitus without complications: Secondary | ICD-10-CM | POA: Diagnosis not present

## 2016-01-14 DIAGNOSIS — E789 Disorder of lipoprotein metabolism, unspecified: Secondary | ICD-10-CM | POA: Diagnosis not present

## 2016-01-14 DIAGNOSIS — E118 Type 2 diabetes mellitus with unspecified complications: Secondary | ICD-10-CM | POA: Diagnosis not present

## 2016-01-14 DIAGNOSIS — Z0000 Encounter for general adult medical examination without abnormal findings: Secondary | ICD-10-CM | POA: Diagnosis not present

## 2016-01-14 DIAGNOSIS — Z79899 Other long term (current) drug therapy: Secondary | ICD-10-CM | POA: Diagnosis not present

## 2016-01-21 DIAGNOSIS — E789 Disorder of lipoprotein metabolism, unspecified: Secondary | ICD-10-CM | POA: Diagnosis not present

## 2016-01-21 DIAGNOSIS — I10 Essential (primary) hypertension: Secondary | ICD-10-CM | POA: Diagnosis not present

## 2016-01-21 DIAGNOSIS — E118 Type 2 diabetes mellitus with unspecified complications: Secondary | ICD-10-CM | POA: Diagnosis not present

## 2016-01-26 DIAGNOSIS — L409 Psoriasis, unspecified: Secondary | ICD-10-CM | POA: Diagnosis not present

## 2016-03-10 DIAGNOSIS — H00034 Abscess of left upper eyelid: Secondary | ICD-10-CM | POA: Diagnosis not present

## 2016-06-21 DIAGNOSIS — E118 Type 2 diabetes mellitus with unspecified complications: Secondary | ICD-10-CM | POA: Diagnosis not present

## 2016-06-28 DIAGNOSIS — Z23 Encounter for immunization: Secondary | ICD-10-CM | POA: Diagnosis not present

## 2016-06-28 DIAGNOSIS — I10 Essential (primary) hypertension: Secondary | ICD-10-CM | POA: Diagnosis not present

## 2016-06-28 DIAGNOSIS — E789 Disorder of lipoprotein metabolism, unspecified: Secondary | ICD-10-CM | POA: Diagnosis not present

## 2016-06-28 DIAGNOSIS — E118 Type 2 diabetes mellitus with unspecified complications: Secondary | ICD-10-CM | POA: Diagnosis not present

## 2016-11-22 DIAGNOSIS — E118 Type 2 diabetes mellitus with unspecified complications: Secondary | ICD-10-CM | POA: Diagnosis not present

## 2016-11-29 DIAGNOSIS — N289 Disorder of kidney and ureter, unspecified: Secondary | ICD-10-CM | POA: Diagnosis not present

## 2016-11-29 DIAGNOSIS — I10 Essential (primary) hypertension: Secondary | ICD-10-CM | POA: Diagnosis not present

## 2016-11-29 DIAGNOSIS — E118 Type 2 diabetes mellitus with unspecified complications: Secondary | ICD-10-CM | POA: Diagnosis not present

## 2016-11-29 DIAGNOSIS — L0390 Cellulitis, unspecified: Secondary | ICD-10-CM | POA: Diagnosis not present

## 2016-11-30 DIAGNOSIS — H2513 Age-related nuclear cataract, bilateral: Secondary | ICD-10-CM | POA: Diagnosis not present

## 2016-11-30 DIAGNOSIS — E119 Type 2 diabetes mellitus without complications: Secondary | ICD-10-CM | POA: Diagnosis not present

## 2016-12-21 DIAGNOSIS — N2581 Secondary hyperparathyroidism of renal origin: Secondary | ICD-10-CM | POA: Diagnosis not present

## 2016-12-21 DIAGNOSIS — D631 Anemia in chronic kidney disease: Secondary | ICD-10-CM | POA: Diagnosis not present

## 2016-12-21 DIAGNOSIS — Z6829 Body mass index (BMI) 29.0-29.9, adult: Secondary | ICD-10-CM | POA: Diagnosis not present

## 2016-12-21 DIAGNOSIS — N183 Chronic kidney disease, stage 3 (moderate): Secondary | ICD-10-CM | POA: Diagnosis not present

## 2016-12-25 ENCOUNTER — Emergency Department (HOSPITAL_COMMUNITY)
Admission: EM | Admit: 2016-12-25 | Discharge: 2016-12-25 | Disposition: A | Discharge status: Home / Self Care | Payer: Medicare Other | Attending: Emergency Medicine | Admitting: Emergency Medicine

## 2016-12-25 ENCOUNTER — Emergency Department (HOSPITAL_COMMUNITY): Payer: Medicare Other

## 2016-12-25 ENCOUNTER — Encounter (HOSPITAL_COMMUNITY): Payer: Self-pay | Admitting: Emergency Medicine

## 2016-12-25 DIAGNOSIS — N183 Chronic kidney disease, stage 3 unspecified: Secondary | ICD-10-CM

## 2016-12-25 DIAGNOSIS — Z794 Long term (current) use of insulin: Secondary | ICD-10-CM | POA: Diagnosis not present

## 2016-12-25 DIAGNOSIS — Z79899 Other long term (current) drug therapy: Secondary | ICD-10-CM | POA: Diagnosis not present

## 2016-12-25 DIAGNOSIS — Z87891 Personal history of nicotine dependence: Secondary | ICD-10-CM | POA: Insufficient documentation

## 2016-12-25 DIAGNOSIS — K8020 Calculus of gallbladder without cholecystitis without obstruction: Secondary | ICD-10-CM | POA: Diagnosis not present

## 2016-12-25 DIAGNOSIS — I129 Hypertensive chronic kidney disease with stage 1 through stage 4 chronic kidney disease, or unspecified chronic kidney disease: Secondary | ICD-10-CM | POA: Diagnosis not present

## 2016-12-25 DIAGNOSIS — N201 Calculus of ureter: Secondary | ICD-10-CM | POA: Insufficient documentation

## 2016-12-25 DIAGNOSIS — R1032 Left lower quadrant pain: Secondary | ICD-10-CM | POA: Diagnosis present

## 2016-12-25 DIAGNOSIS — N200 Calculus of kidney: Secondary | ICD-10-CM

## 2016-12-25 DIAGNOSIS — E1122 Type 2 diabetes mellitus with diabetic chronic kidney disease: Secondary | ICD-10-CM | POA: Diagnosis not present

## 2016-12-25 LAB — URINALYSIS, ROUTINE W REFLEX MICROSCOPIC
Bacteria, UA: NONE SEEN
Bilirubin Urine: NEGATIVE
Glucose, UA: 50 mg/dL — AB
Ketones, ur: NEGATIVE mg/dL
Leukocytes, UA: NEGATIVE
Nitrite: NEGATIVE
Protein, ur: NEGATIVE mg/dL
Specific Gravity, Urine: 1.013 (ref 1.005–1.030)
Squamous Epithelial / HPF: NONE SEEN
pH: 5 (ref 5.0–8.0)

## 2016-12-25 LAB — COMPREHENSIVE METABOLIC PANEL
ALT: 16 U/L — ABNORMAL LOW (ref 17–63)
AST: 18 U/L (ref 15–41)
Albumin: 3.7 g/dL (ref 3.5–5.0)
Alkaline Phosphatase: 35 U/L — ABNORMAL LOW (ref 38–126)
Anion gap: 12 (ref 5–15)
BILIRUBIN TOTAL: 0.6 mg/dL (ref 0.3–1.2)
BUN: 22 mg/dL — ABNORMAL HIGH (ref 6–20)
CO2: 21 mmol/L — ABNORMAL LOW (ref 22–32)
Calcium: 9.7 mg/dL (ref 8.9–10.3)
Chloride: 104 mmol/L (ref 101–111)
Creatinine, Ser: 2.18 mg/dL — ABNORMAL HIGH (ref 0.61–1.24)
GFR calc Af Amer: 33 mL/min — ABNORMAL LOW (ref >60)
GFR calc non Af Amer: 29 mL/min — ABNORMAL LOW (ref >60)
Glucose, Bld: 206 mg/dL — ABNORMAL HIGH (ref 65–99)
Potassium: 4.4 mmol/L (ref 3.5–5.1)
Sodium: 137 mmol/L (ref 135–145)
TOTAL PROTEIN: 7 g/dL (ref 6.5–8.1)

## 2016-12-25 LAB — CBC
HCT: 39 % (ref 39.0–52.0)
Hemoglobin: 12.9 g/dL — ABNORMAL LOW (ref 13.0–17.0)
MCH: 27.9 pg (ref 26.0–34.0)
MCHC: 33.1 g/dL (ref 30.0–36.0)
MCV: 84.2 fL (ref 78.0–100.0)
Platelets: 340 10*3/uL (ref 150–400)
RBC: 4.63 MIL/uL (ref 4.22–5.81)
RDW: 12.8 % (ref 11.5–15.5)
WBC: 9.1 10*3/uL (ref 4.0–10.5)

## 2016-12-25 LAB — CK: Total CK: 69 U/L (ref 49–397)

## 2016-12-25 LAB — LIPASE, BLOOD: Lipase: 23 U/L (ref 11–51)

## 2016-12-25 MED ORDER — HYDROMORPHONE HCL 1 MG/ML IJ SOLN
0.5000 mg | Freq: Once | INTRAMUSCULAR | Status: AC
  Administered 2016-12-25: 0.5 mg via INTRAVENOUS
  Filled 2016-12-25: qty 1

## 2016-12-25 MED ORDER — ONDANSETRON 4 MG PO TBDP: 4.0000 mg | ORAL_TABLET | Freq: Three times a day (TID) | ORAL | 0 refills | Status: DC | PRN

## 2016-12-25 MED ORDER — ONDANSETRON HCL 4 MG/2ML IJ SOLN
4.0000 mg | Freq: Once | INTRAMUSCULAR | Status: AC
  Administered 2016-12-25: 4 mg via INTRAVENOUS
  Filled 2016-12-25: qty 2

## 2016-12-25 MED ORDER — SODIUM CHLORIDE 0.9 % IV BOLUS (SEPSIS)
500.0000 mL | Freq: Once | INTRAVENOUS | Status: AC
  Administered 2016-12-25: 500 mL via INTRAVENOUS

## 2016-12-25 MED ORDER — OXYCODONE-ACETAMINOPHEN 5-325 MG PO TABS: 2.0000 | ORAL_TABLET | ORAL | 0 refills | Status: DC | PRN

## 2016-12-25 MED ORDER — IOPAMIDOL (ISOVUE-300) INJECTION 61%
INTRAVENOUS | Status: AC
  Filled 2016-12-25: qty 30

## 2016-12-25 MED ORDER — TAMSULOSIN HCL 0.4 MG PO CAPS: 0.4000 mg | ORAL_CAPSULE | Freq: Every day | ORAL | 0 refills | Status: DC

## 2016-12-25 MED ORDER — SODIUM CHLORIDE 0.9 % IV BOLUS (SEPSIS): 1000.0000 mL | Freq: Once | INTRAVENOUS | Status: DC

## 2016-12-25 NOTE — ED Notes (Signed)
Pt understood dc material. NAD noted. Scripts given at dc 

## 2016-12-25 NOTE — ED Provider Notes (Signed)
  Face-to-face evaluation   History: He presents for evaluation of left lower quadrant abdominal pain, decreased urinary output, decreased stool output, and bloating for several days.  Physical exam: Alert, cooperative.  Abdomen is distended.  Abdomen is soft with guarding and tenderness in the lower quadrants bilaterally.  Medical screening examination/treatment/procedure(s) were conducted as a shared visit with non-physician practitioner(s) and myself.  I personally evaluated the patient during the encounter    Mancel BaleWentz, Moorea Boissonneault, MD 12/28/16 1321

## 2016-12-25 NOTE — ED Notes (Signed)
This RN 1 on 1 with another pt.  Kathlene NovemberMike, RN checked on pt and denies any needs at this time.

## 2016-12-25 NOTE — ED Notes (Signed)
Crackers, peanut butter, and soda given to wife at bedside.  Pt drinking contrast for CT.

## 2016-12-25 NOTE — ED Provider Notes (Signed)
[facility]-EMERGENCY DEPT Provider Note   CSN: [id number] Arrival date & time: 12/25/16  1315     History   Chief Complaint Chief Complaint  Patient presents with  . Abdominal Pain    HPI James Roberson is a 71 y.o. male.  Patient with previous history of spinal surgeries, no previous abdominal surgeries, diabetes, CKD -- presents with 2 day history of left lower quadrant abdominal pain that is worse with movement and palpation. Patient also states that he had one episode of cola-colored urine. Patient states that to help with this he drank approximately 30 glasses of water so that he could urinate. Pain has gradually been worsening. No fevers, vomiting, shortness of breath. Patient has not had a bowel movement in the past 1 day. He continues to pass gas. No treatments prior to arrival. The onset of this condition was acute. Alleviating factors: none. No history of colonoscopy.       Past Medical History:  Diagnosis Date  . Diabetes mellitus without complication (HCC)   . Hyperlipidemia   . Hypertension   . Obesity     Patient Active Problem List   Diagnosis Date Noted  . CKD (chronic kidney disease) stage 3, GFR 30-59 ml/min 11/11/2014  . Cellulitis 10/30/2014  . Diabetes mellitus type 2, controlled (HCC) 10/30/2014  . Hyperlipidemia 10/30/2014  . Renal failure (ARF), acute on chronic (HCC) 10/30/2014  . Sepsis (HCC) 10/30/2014  . ULCER, LEG 09/25/2008  . DIZZINESS 09/25/2008  . DIABETES MELLITUS, TYPE II 09/23/2008  . HYPERLIPIDEMIA 09/23/2008  . GOUT 09/23/2008  . Essential hypertension 09/23/2008  . PSORIASIS, HX OF 09/23/2008    Past Surgical History:  Procedure Laterality Date  . BACK SURGERY    . LEG SURGERY    . LUMBAR LAMINECTOMY  1989       Home Medications    Prior to Admission medications   Medication Sig Start Date End Date Taking? Authorizing Provider  acitretin (SORIATANE) 25 MG capsule Take 25 mg by mouth every other day. For psoriasis     [provider]  allopurinol (ZYLOPRIM) 100 MG tablet Take 100 mg by mouth daily.    [provider]  atorvastatin (LIPITOR) 40 MG tablet Take 20 mg by mouth at bedtime.     [provider]  buPROPion (WELLBUTRIN XL) 150 MG 24 hr tablet Take 150 mg by mouth daily.  10/21/14   [provider]  Choline Fenofibrate (TRILIPIX) 135 MG capsule Take 135 mg by mouth every other day.    [provider]  Emollient (LUBRIDERM SERIOUSLY SENSITIVE) LOTN Apply 1 application topically 2 (two) times daily. Patient taking differently: Apply 1 application topically 4 (four) times daily.  11/03/14   Edsel Petrin, DO  HYDROcodone-acetaminophen (NORCO/VICODIN) 5-325 MG per tablet Take 1 tablet by mouth at bedtime.  10/28/14   [provider]  insulin detemir (LEVEMIR) 100 UNIT/ML injection Inject 0.18 mLs (18 Units total) into the skin at bedtime. 11/03/14   Mikhail, Nita Sells, DO  saccharomyces boulardii (FLORASTOR) 250 MG capsule Take 1 capsule (250 mg total) by mouth 2 (two) times daily. Patient taking differently: Take 250 mg by mouth 2 (two) times daily. Take while on antibiotics 11/03/14   Edsel Petrin, DO  sitaGLIPtin (JANUVIA) 100 MG tablet Take 50 mg by mouth at bedtime.     [provider]  valsartan-hydrochlorothiazide (DIOVAN-HCT) 320-25 MG per tablet Take 1 tablet by mouth daily.    [provider]  vancomycin 1,500 mg in sodium  chloride 0.9 % 500 mL Inject 1,500 mg into the vein daily. 11/17/14   Mikhail, Nita SellsMaryann, DO  zolpidem (AMBIEN) 10 MG tablet Take 10 mg by mouth at bedtime.     [provider]    Family History Family History  Problem Relation Age of Onset  . CAD Father     Social History Social History  Substance Use Topics  . Smoking status: Former Smoker    Quit date: 07/23/1966  . Smokeless tobacco: Never Used  . Alcohol use Yes     Comment: 1 a quarter     Allergies   Avelox abc [moxifloxacin];  Tape; Ambien cr [zolpidem tartrate er]; and Ambien [zolpidem]   Review of Systems Review of Systems  Constitutional: Negative for fever.  HENT: Negative for rhinorrhea and sore throat.   Eyes: Negative for redness.  Respiratory: Negative for cough.   Cardiovascular: Negative for chest pain.  Gastrointestinal: Positive for abdominal pain and constipation. Negative for diarrhea, nausea and vomiting.  Genitourinary: Positive for decreased urine volume. Negative for dysuria.  Musculoskeletal: Negative for myalgias.  Skin: Negative for rash.  Neurological: Negative for headaches.     Physical Exam Updated Vital Signs BP (!) 190/88   Pulse 86   Temp 98.7 F (37.1 C) (Oral)   Resp 18   Ht 5\' 9"  (1.753 m)   Wt 201 lb 6 oz (91.3 kg)   SpO2 99%   BMI 29.74 kg/m   Physical Exam  Constitutional: He appears well-developed and well-nourished.  HENT:  Head: Normocephalic and atraumatic.  Eyes: Conjunctivae are normal. Right eye exhibits no discharge. Left eye exhibits no discharge.  Neck: Normal range of motion. Neck supple.  Cardiovascular: Normal rate, regular rhythm and normal heart sounds.   No murmur heard. Pulmonary/Chest: Effort normal and breath sounds normal. No respiratory distress. He has no wheezes. He has no rales.  Abdominal: Soft. There is tenderness. There is guarding. There is no rebound.  Moderate LLQ tenderness  Neurological: He is alert.  Skin: Skin is warm and dry.  Psychiatric: He has a normal mood and affect.  Nursing note and vitals reviewed.    ED Treatments / Results  Labs (all labs ordered are listed, but only abnormal results are displayed) Labs Reviewed  COMPREHENSIVE METABOLIC PANEL - Abnormal; Notable for the following:       Result Value   CO2 21 (*)    Glucose, Bld 206 (*)    BUN 22 (*)    Creatinine, Ser 2.18 (*)    ALT 16 (*)    Alkaline Phosphatase 35 (*)    GFR calc non Af Amer 29 (*)    GFR calc Af Amer 33 (*)    All other  components within normal limits  CBC - Abnormal; Notable for the following:    Hemoglobin 12.9 (*)    All other components within normal limits  LIPASE, BLOOD  CK  URINALYSIS, ROUTINE W REFLEX MICROSCOPIC    Procedures Procedures (including critical care time)  Medications Ordered in ED Medications  iopamidol (ISOVUE-300) 61 % injection (not administered)  sodium chloride 0.9 % bolus 500 mL (not administered)  HYDROmorphone (DILAUDID) injection 0.5 mg (0.5 mg Intravenous Given 12/25/16 1509)  ondansetron (ZOFRAN) injection 4 mg (4 mg Intravenous Given 12/25/16 1509)     Initial Impression / Assessment and Plan / ED Course  I have reviewed the triage vital signs and the nursing notes.  Pertinent labs & imaging results that were available during  my care of the patient were reviewed by me and considered in my medical decision making (see chart for details).     Patient seen and examined. Work-up initiated. Medications ordered. Will need renal function to determine if patient can have IV contrast.   Vital signs reviewed and are as follows: BP (!) 190/88   Pulse 86   Temp 98.7 F (37.1 C) (Oral)   Resp 18   Ht 5\' 9"  (1.753 m)   Wt 201 lb 6 oz (91.3 kg)   SpO2 99%   BMI 29.74 kg/m   3:43 PM Handoff at shift change to [facility].   Pending result of CT.     Final Clinical Impressions(s) / ED Diagnoses   Final diagnoses:  LLQ abdominal pain  Stage 3 chronic kidney disease   Pending completion of work-up.    New Prescriptions New Prescriptions   No medications on file     [name], Cordelia Poche 12/25/16 1546    Mancel Bale, MD 12/25/16 1612    Mancel Bale, MD 12/28/16 1320

## 2016-12-25 NOTE — ED Provider Notes (Signed)
Pt's care turned over to me by James BleacherJosh Geiple PA with ct scan pending.   Ct shows 4mm left ureteral calculus at iliac crest.   Pt counseled on result.   Pt advised to follow up with Urologist for evaluation.    Meds ordered this encounter  Medications  . HYDROmorphone (DILAUDID) injection 0.5 mg  . ondansetron (ZOFRAN) injection 4 mg  . iopamidol (ISOVUE-300) 61 % injection    Lineback, Kimberly  : cabinet override  . DISCONTD: sodium chloride 0.9 % bolus 1,000 mL  . sodium chloride 0.9 % bolus 500 mL  . metFORMIN (GLUCOPHAGE-XR) 500 MG 24 hr tablet    Sig: Take 1,000 mg by mouth 2 (two) times daily.  . cyanocobalamin 500 MCG tablet    Sig: Take 500 mcg by mouth daily.  [name]. oxyCODONE-acetaminophen (PERCOCET/ROXICET) 5-325 MG tablet    Sig: Take 2 tablets by mouth every 4 (four) hours as needed for severe pain.    Dispense:  20 tablet    Refill:  0    Order Specific Question:   Supervising Provider    Answer:   MILLER, BRIAN [[id number]]  . tamsulosin (FLOMAX) 0.4 MG CAPS capsule    Sig: Take 1 capsule (0.4 mg total) by mouth daily.    Dispense:  14 capsule    Refill:  0    Order Specific Question:   Supervising Provider    Answer:   MILLER, BRIAN [[id number]]  . ondansetron (ZOFRAN ODT) 4 MG disintegrating tablet    Sig: Take 1 tablet (4 mg total) by mouth every 8 (eight) hours as needed for nausea or vomiting.    Dispense:  20 tablet    Refill:  0    Order Specific Question:   Supervising Provider    Answer:   Eber HongMILLER, BRIAN [[id number]]  An After Visit Summary was printed and given to the patient.    Elson AreasSofia, James Roberson, [place] 12/25/16 Ebony [id number]    Mancel BaleWentz, Elliott, MD 12/28/16 1321

## 2016-12-25 NOTE — ED Triage Notes (Signed)
Pt c/o left lower quadrant abdominal pain. Pt denies N/V at present.

## 2016-12-25 NOTE — ED Notes (Signed)
Pt to CT

## 2016-12-27 DIAGNOSIS — N201 Calculus of ureter: Secondary | ICD-10-CM | POA: Diagnosis not present

## 2016-12-29 ENCOUNTER — Other Ambulatory Visit: Payer: Self-pay | Admitting: Urology

## 2017-01-04 DIAGNOSIS — Z6829 Body mass index (BMI) 29.0-29.9, adult: Secondary | ICD-10-CM | POA: Diagnosis not present

## 2017-01-04 DIAGNOSIS — N183 Chronic kidney disease, stage 3 (moderate): Secondary | ICD-10-CM | POA: Diagnosis not present

## 2017-01-04 DIAGNOSIS — I129 Hypertensive chronic kidney disease with stage 1 through stage 4 chronic kidney disease, or unspecified chronic kidney disease: Secondary | ICD-10-CM | POA: Diagnosis not present

## 2017-01-04 DIAGNOSIS — N2581 Secondary hyperparathyroidism of renal origin: Secondary | ICD-10-CM | POA: Diagnosis not present

## 2017-01-04 DIAGNOSIS — D631 Anemia in chronic kidney disease: Secondary | ICD-10-CM | POA: Diagnosis not present

## 2017-01-05 ENCOUNTER — Encounter: Payer: Medicare Other | Attending: Endocrinology | Admitting: Dietician

## 2017-01-05 ENCOUNTER — Encounter: Payer: Self-pay | Admitting: Dietician

## 2017-01-05 DIAGNOSIS — I10 Essential (primary) hypertension: Secondary | ICD-10-CM | POA: Diagnosis not present

## 2017-01-05 DIAGNOSIS — E785 Hyperlipidemia, unspecified: Secondary | ICD-10-CM | POA: Insufficient documentation

## 2017-01-05 DIAGNOSIS — E119 Type 2 diabetes mellitus without complications: Secondary | ICD-10-CM | POA: Insufficient documentation

## 2017-01-05 DIAGNOSIS — Z713 Dietary counseling and surveillance: Secondary | ICD-10-CM | POA: Diagnosis not present

## 2017-01-05 DIAGNOSIS — N184 Chronic kidney disease, stage 4 (severe): Secondary | ICD-10-CM

## 2017-01-05 DIAGNOSIS — N289 Disorder of kidney and ureter, unspecified: Secondary | ICD-10-CM | POA: Insufficient documentation

## 2017-01-05 NOTE — Patient Instructions (Addendum)
Avoid processed meat Choose foods that are not processed most of the time. Begin to limit added salt.  Choose fresh vegetables and fruit  Frozen vegetables without added salt Don't use salt substitutes. (anything with Potassium Chloride)  Herbs, onions, garlic, lemon or lime juice are good choices to season your food. Limit meat to small amounts. Avoid skipping meals. Stay active.

## 2017-01-05 NOTE — Progress Notes (Signed)
Medical Nutrition Therapy:  Appt start time: 1430 end time:  1540.   Assessment:  Primary concerns today: Patient is here today alone.  He was referred due to renal insufficiency.  He has had type 2 diabetes for 30 years with current A1C of 6.6%.  Other hx includes Kidney stone (last one 2 weeks ago), hyperlipidemia, and HTN.  He has looked up kidney disease diet on the internet and knows about the diabetic diet and now is confused.   He has been following a vegetarian diet for the past 2 weeks.  He has lost 15 lbs in the past year by changing his eating habits.  Weight today 199 lbs.  Labs include:   12/25/16:  BUN 22, Creatinine 2.18, potassium 4.4, EGFR 29  Patient lives with his wife.  He does the shopping and cooking.  His daughter and 4 grandchildren also live there but he does not due the cooking.  He is retired from the [employer].  Preferred Learning Style:   No preference indicated   Learning Readiness:   Ready   MEDICATIONS: see list to include Januvia, Levemir 16 units, metformin   DIETARY INTAKE:  Usual eating pattern includes 2-3 meals and 2 snacks per day. 80% vegetarian for the past 2 weeks. Everyday foods include added salt.  Avoided foods include splenda.    24-hr recall: 2 weeks ago B ( AM): 2 eggs and white or rye toast, and occasionally leftover meat  Snk ( AM): anything and everything and all day  L ( PM): leftovers OR tuna salad Snk ( PM):  D ( PM): 3-4 anything and everything and all daynights per week meat, vegetables OR Chinese takeout OR leftovers Snk ( PM): anything and everything and all day Beverages: 1/2 pot coffee with half and half and 1 level tsp sugar per 14 ounces, water   24-hr recall: now B ( AM): leftover vegetables from dinner (toast and eggs once per week)  Snk ( AM): nuts  L ( PM): leftovers or tuna Snk ( PM): nuts D ( PM): cream of broccoli soup OR escarole and eggplant patties Snk ( PM): 1/2 70% dark chocolate  bar Beverages: 1 cup decaf coffee with 1 tsp sugar   Usual physical activity: gardens when the weather is better  Estimated energy needs: 1800 calories 60 g protein  Progress Towards Goal(s):  In progress.   Nutritional Diagnosis:  NB-1.1 Food and nutrition-related knowledge deficit As related to renal disease and nutrition.  As evidenced by patient report.    Intervention:  Nutrition counseling/education related to renal/DM diet.  Discussed plant vs animal protein, discussed decreasing sodium intake, improving nutrition composition.  Other restrictions to add when needed such as low phosphorous and low potassium.  Avoid processed meat Choose foods that are not processed most of the time. Begin to limit added salt.  Choose fresh vegetables and fruit  Frozen vegetables without added salt Don't use salt substitutes. (anything with Potassium Chloride)  Herbs, onions, garlic, lemon or lime juice are good choices to season your food. Limit meat to small amounts. Avoid skipping meals. Stay active.  Teaching Method Utilized:  Visual Auditory Hands on  Handouts given during visit include:  Renal pyramid from [employer] reducing protein  Kidney resources on line including [employer]   Meal plan card  My plate  Barriers to learning/adherence to lifestyle change: medical problems  Demonstrated degree of understanding via:  Teach Back   Monitoring/Evaluation:  Dietary intake,  exercise, and body weight prn.

## 2017-01-06 DIAGNOSIS — N201 Calculus of ureter: Secondary | ICD-10-CM | POA: Diagnosis not present

## 2017-01-20 ENCOUNTER — Ambulatory Visit (HOSPITAL_BASED_OUTPATIENT_CLINIC_OR_DEPARTMENT_OTHER): Admit: 2017-01-20 | Payer: PRIVATE HEALTH INSURANCE | Admitting: Urology

## 2017-01-20 ENCOUNTER — Encounter (HOSPITAL_BASED_OUTPATIENT_CLINIC_OR_DEPARTMENT_OTHER): Payer: Self-pay

## 2017-01-20 SURGERY — CYSTOSCOPY/URETEROSCOPY/HOLMIUM LASER/STENT PLACEMENT
Anesthesia: General | Laterality: Left

## 2017-01-30 DIAGNOSIS — Z125 Encounter for screening for malignant neoplasm of prostate: Secondary | ICD-10-CM | POA: Diagnosis not present

## 2017-01-30 DIAGNOSIS — E118 Type 2 diabetes mellitus with unspecified complications: Secondary | ICD-10-CM | POA: Diagnosis not present

## 2017-01-30 DIAGNOSIS — Z0000 Encounter for general adult medical examination without abnormal findings: Secondary | ICD-10-CM | POA: Diagnosis not present

## 2017-01-30 DIAGNOSIS — I10 Essential (primary) hypertension: Secondary | ICD-10-CM | POA: Diagnosis not present

## 2017-02-07 DIAGNOSIS — D649 Anemia, unspecified: Secondary | ICD-10-CM | POA: Diagnosis not present

## 2017-02-07 DIAGNOSIS — N289 Disorder of kidney and ureter, unspecified: Secondary | ICD-10-CM | POA: Diagnosis not present

## 2017-02-07 DIAGNOSIS — E118 Type 2 diabetes mellitus with unspecified complications: Secondary | ICD-10-CM | POA: Diagnosis not present

## 2017-02-07 DIAGNOSIS — R899 Unspecified abnormal finding in specimens from other organs, systems and tissues: Secondary | ICD-10-CM | POA: Diagnosis not present

## 2017-05-04 DIAGNOSIS — I10 Essential (primary) hypertension: Secondary | ICD-10-CM | POA: Diagnosis not present

## 2017-05-04 DIAGNOSIS — E118 Type 2 diabetes mellitus with unspecified complications: Secondary | ICD-10-CM | POA: Diagnosis not present

## 2017-05-11 DIAGNOSIS — L0390 Cellulitis, unspecified: Secondary | ICD-10-CM | POA: Diagnosis not present

## 2017-05-11 DIAGNOSIS — D631 Anemia in chronic kidney disease: Secondary | ICD-10-CM | POA: Diagnosis not present

## 2017-05-11 DIAGNOSIS — N200 Calculus of kidney: Secondary | ICD-10-CM | POA: Diagnosis not present

## 2017-05-11 DIAGNOSIS — R030 Elevated blood-pressure reading, without diagnosis of hypertension: Secondary | ICD-10-CM | POA: Diagnosis not present

## 2017-05-11 DIAGNOSIS — N2581 Secondary hyperparathyroidism of renal origin: Secondary | ICD-10-CM | POA: Diagnosis not present

## 2017-05-11 DIAGNOSIS — L403 Pustulosis palmaris et plantaris: Secondary | ICD-10-CM | POA: Diagnosis not present

## 2017-05-11 DIAGNOSIS — N183 Chronic kidney disease, stage 3 (moderate): Secondary | ICD-10-CM | POA: Diagnosis not present

## 2017-05-11 DIAGNOSIS — M1A0710 Idiopathic chronic gout, right ankle and foot, without tophus (tophi): Secondary | ICD-10-CM | POA: Diagnosis not present

## 2017-05-11 DIAGNOSIS — R51 Headache: Secondary | ICD-10-CM | POA: Diagnosis not present

## 2017-05-11 DIAGNOSIS — E118 Type 2 diabetes mellitus with unspecified complications: Secondary | ICD-10-CM | POA: Diagnosis not present

## 2017-05-11 DIAGNOSIS — M542 Cervicalgia: Secondary | ICD-10-CM | POA: Diagnosis not present

## 2017-05-17 DIAGNOSIS — M5412 Radiculopathy, cervical region: Secondary | ICD-10-CM | POA: Diagnosis not present

## 2017-05-17 DIAGNOSIS — R51 Headache: Secondary | ICD-10-CM | POA: Diagnosis not present

## 2017-05-17 DIAGNOSIS — G609 Hereditary and idiopathic neuropathy, unspecified: Secondary | ICD-10-CM | POA: Diagnosis not present

## 2017-05-19 ENCOUNTER — Other Ambulatory Visit (HOSPITAL_COMMUNITY): Payer: Self-pay | Admitting: Psychiatry

## 2017-05-19 DIAGNOSIS — R51 Headache: Principal | ICD-10-CM

## 2017-05-19 DIAGNOSIS — R519 Headache, unspecified: Secondary | ICD-10-CM

## 2017-05-22 DIAGNOSIS — L409 Psoriasis, unspecified: Secondary | ICD-10-CM | POA: Diagnosis not present

## 2017-05-27 ENCOUNTER — Ambulatory Visit (HOSPITAL_COMMUNITY)
Admission: RE | Admit: 2017-05-27 | Discharge: 2017-05-27 | Disposition: A | Discharge status: Home / Self Care | Payer: Medicare Other | Source: Ambulatory Visit | Attending: Psychiatry | Admitting: Psychiatry

## 2017-05-27 DIAGNOSIS — R51 Headache: Secondary | ICD-10-CM | POA: Diagnosis not present

## 2017-05-27 DIAGNOSIS — G930 Cerebral cysts: Secondary | ICD-10-CM | POA: Insufficient documentation

## 2017-05-27 DIAGNOSIS — R519 Headache, unspecified: Secondary | ICD-10-CM

## 2017-05-27 DIAGNOSIS — G319 Degenerative disease of nervous system, unspecified: Secondary | ICD-10-CM | POA: Insufficient documentation

## 2017-07-10 DIAGNOSIS — G5601 Carpal tunnel syndrome, right upper limb: Secondary | ICD-10-CM | POA: Diagnosis not present

## 2017-07-10 DIAGNOSIS — G609 Hereditary and idiopathic neuropathy, unspecified: Secondary | ICD-10-CM | POA: Diagnosis not present

## 2017-07-10 DIAGNOSIS — R51 Headache: Secondary | ICD-10-CM | POA: Diagnosis not present

## 2017-07-10 DIAGNOSIS — E1142 Type 2 diabetes mellitus with diabetic polyneuropathy: Secondary | ICD-10-CM | POA: Diagnosis not present

## 2017-07-10 DIAGNOSIS — M5412 Radiculopathy, cervical region: Secondary | ICD-10-CM | POA: Diagnosis not present

## 2017-08-15 DIAGNOSIS — M549 Dorsalgia, unspecified: Secondary | ICD-10-CM | POA: Diagnosis not present

## 2017-08-15 DIAGNOSIS — G8929 Other chronic pain: Secondary | ICD-10-CM | POA: Diagnosis not present

## 2017-08-22 DIAGNOSIS — I129 Hypertensive chronic kidney disease with stage 1 through stage 4 chronic kidney disease, or unspecified chronic kidney disease: Secondary | ICD-10-CM | POA: Diagnosis not present

## 2017-08-22 DIAGNOSIS — N183 Chronic kidney disease, stage 3 (moderate): Secondary | ICD-10-CM | POA: Diagnosis not present

## 2017-08-22 DIAGNOSIS — N2581 Secondary hyperparathyroidism of renal origin: Secondary | ICD-10-CM | POA: Diagnosis not present

## 2017-08-22 DIAGNOSIS — D631 Anemia in chronic kidney disease: Secondary | ICD-10-CM | POA: Diagnosis not present

## 2017-08-22 DIAGNOSIS — Z6829 Body mass index (BMI) 29.0-29.9, adult: Secondary | ICD-10-CM | POA: Diagnosis not present

## 2017-09-19 DIAGNOSIS — M79601 Pain in right arm: Secondary | ICD-10-CM | POA: Diagnosis not present

## 2017-09-19 DIAGNOSIS — M5412 Radiculopathy, cervical region: Secondary | ICD-10-CM | POA: Diagnosis not present

## 2017-09-19 DIAGNOSIS — M542 Cervicalgia: Secondary | ICD-10-CM | POA: Diagnosis not present

## 2017-09-19 DIAGNOSIS — Z5181 Encounter for therapeutic drug level monitoring: Secondary | ICD-10-CM | POA: Diagnosis not present

## 2017-09-19 DIAGNOSIS — Z79899 Other long term (current) drug therapy: Secondary | ICD-10-CM | POA: Diagnosis not present

## 2017-09-19 DIAGNOSIS — Z9889 Other specified postprocedural states: Secondary | ICD-10-CM | POA: Diagnosis not present

## 2017-09-26 DIAGNOSIS — M4722 Other spondylosis with radiculopathy, cervical region: Secondary | ICD-10-CM | POA: Diagnosis not present

## 2017-10-09 DIAGNOSIS — E118 Type 2 diabetes mellitus with unspecified complications: Secondary | ICD-10-CM | POA: Diagnosis not present

## 2017-10-09 DIAGNOSIS — N183 Chronic kidney disease, stage 3 (moderate): Secondary | ICD-10-CM | POA: Diagnosis not present

## 2017-10-10 DIAGNOSIS — E789 Disorder of lipoprotein metabolism, unspecified: Secondary | ICD-10-CM | POA: Diagnosis not present

## 2017-10-10 DIAGNOSIS — I10 Essential (primary) hypertension: Secondary | ICD-10-CM | POA: Diagnosis not present

## 2017-10-10 DIAGNOSIS — E118 Type 2 diabetes mellitus with unspecified complications: Secondary | ICD-10-CM | POA: Diagnosis not present

## 2017-10-10 DIAGNOSIS — N183 Chronic kidney disease, stage 3 (moderate): Secondary | ICD-10-CM | POA: Diagnosis not present

## 2017-10-18 DIAGNOSIS — R42 Dizziness and giddiness: Secondary | ICD-10-CM | POA: Diagnosis not present

## 2017-10-18 DIAGNOSIS — R51 Headache: Secondary | ICD-10-CM | POA: Diagnosis not present

## 2017-10-18 DIAGNOSIS — M5412 Radiculopathy, cervical region: Secondary | ICD-10-CM | POA: Diagnosis not present

## 2017-10-18 DIAGNOSIS — N2581 Secondary hyperparathyroidism of renal origin: Secondary | ICD-10-CM | POA: Diagnosis not present

## 2017-10-18 DIAGNOSIS — Z6829 Body mass index (BMI) 29.0-29.9, adult: Secondary | ICD-10-CM | POA: Diagnosis not present

## 2017-10-18 DIAGNOSIS — N183 Chronic kidney disease, stage 3 (moderate): Secondary | ICD-10-CM | POA: Diagnosis not present

## 2017-10-18 DIAGNOSIS — E1142 Type 2 diabetes mellitus with diabetic polyneuropathy: Secondary | ICD-10-CM | POA: Diagnosis not present

## 2017-10-18 DIAGNOSIS — D631 Anemia in chronic kidney disease: Secondary | ICD-10-CM | POA: Diagnosis not present

## 2017-10-19 DIAGNOSIS — G894 Chronic pain syndrome: Secondary | ICD-10-CM | POA: Diagnosis not present

## 2017-10-19 DIAGNOSIS — Z9889 Other specified postprocedural states: Secondary | ICD-10-CM | POA: Diagnosis not present

## 2017-10-19 DIAGNOSIS — M5412 Radiculopathy, cervical region: Secondary | ICD-10-CM | POA: Diagnosis not present

## 2017-10-19 DIAGNOSIS — M4722 Other spondylosis with radiculopathy, cervical region: Secondary | ICD-10-CM | POA: Diagnosis not present

## 2017-10-27 DIAGNOSIS — Z888 Allergy status to other drugs, medicaments and biological substances status: Secondary | ICD-10-CM | POA: Diagnosis not present

## 2017-10-27 DIAGNOSIS — Z87891 Personal history of nicotine dependence: Secondary | ICD-10-CM | POA: Diagnosis not present

## 2017-10-27 DIAGNOSIS — M47812 Spondylosis without myelopathy or radiculopathy, cervical region: Secondary | ICD-10-CM | POA: Diagnosis not present

## 2017-10-27 DIAGNOSIS — E119 Type 2 diabetes mellitus without complications: Secondary | ICD-10-CM | POA: Diagnosis not present

## 2017-10-27 DIAGNOSIS — Z91048 Other nonmedicinal substance allergy status: Secondary | ICD-10-CM | POA: Diagnosis not present

## 2017-10-27 DIAGNOSIS — G894 Chronic pain syndrome: Secondary | ICD-10-CM | POA: Diagnosis not present

## 2017-10-27 DIAGNOSIS — Z881 Allergy status to other antibiotic agents status: Secondary | ICD-10-CM | POA: Diagnosis not present

## 2017-11-10 DIAGNOSIS — M47892 Other spondylosis, cervical region: Secondary | ICD-10-CM | POA: Diagnosis not present

## 2017-11-10 DIAGNOSIS — M47812 Spondylosis without myelopathy or radiculopathy, cervical region: Secondary | ICD-10-CM | POA: Diagnosis not present

## 2017-11-10 DIAGNOSIS — Z87891 Personal history of nicotine dependence: Secondary | ICD-10-CM | POA: Diagnosis not present

## 2017-11-22 DIAGNOSIS — N183 Chronic kidney disease, stage 3 (moderate): Secondary | ICD-10-CM | POA: Diagnosis not present

## 2017-11-22 DIAGNOSIS — I10 Essential (primary) hypertension: Secondary | ICD-10-CM | POA: Diagnosis not present

## 2017-11-22 DIAGNOSIS — E118 Type 2 diabetes mellitus with unspecified complications: Secondary | ICD-10-CM | POA: Diagnosis not present

## 2017-11-22 DIAGNOSIS — E789 Disorder of lipoprotein metabolism, unspecified: Secondary | ICD-10-CM | POA: Diagnosis not present

## 2017-12-01 DIAGNOSIS — Z91048 Other nonmedicinal substance allergy status: Secondary | ICD-10-CM | POA: Diagnosis not present

## 2017-12-01 DIAGNOSIS — M47812 Spondylosis without myelopathy or radiculopathy, cervical region: Secondary | ICD-10-CM | POA: Diagnosis not present

## 2017-12-01 DIAGNOSIS — Z9889 Other specified postprocedural states: Secondary | ICD-10-CM | POA: Diagnosis not present

## 2017-12-01 DIAGNOSIS — M542 Cervicalgia: Secondary | ICD-10-CM | POA: Diagnosis not present

## 2017-12-01 DIAGNOSIS — Z888 Allergy status to other drugs, medicaments and biological substances status: Secondary | ICD-10-CM | POA: Diagnosis not present

## 2017-12-01 DIAGNOSIS — Z87891 Personal history of nicotine dependence: Secondary | ICD-10-CM | POA: Diagnosis not present

## 2017-12-01 DIAGNOSIS — G894 Chronic pain syndrome: Secondary | ICD-10-CM | POA: Diagnosis not present

## 2017-12-01 DIAGNOSIS — Z881 Allergy status to other antibiotic agents status: Secondary | ICD-10-CM | POA: Diagnosis not present

## 2017-12-22 DIAGNOSIS — G894 Chronic pain syndrome: Secondary | ICD-10-CM | POA: Diagnosis not present

## 2017-12-22 DIAGNOSIS — I10 Essential (primary) hypertension: Secondary | ICD-10-CM | POA: Diagnosis not present

## 2017-12-22 DIAGNOSIS — E119 Type 2 diabetes mellitus without complications: Secondary | ICD-10-CM | POA: Diagnosis not present

## 2017-12-22 DIAGNOSIS — Z87891 Personal history of nicotine dependence: Secondary | ICD-10-CM | POA: Diagnosis not present

## 2017-12-22 DIAGNOSIS — F329 Major depressive disorder, single episode, unspecified: Secondary | ICD-10-CM | POA: Diagnosis not present

## 2017-12-22 DIAGNOSIS — E7800 Pure hypercholesterolemia, unspecified: Secondary | ICD-10-CM | POA: Diagnosis not present

## 2017-12-22 DIAGNOSIS — M47812 Spondylosis without myelopathy or radiculopathy, cervical region: Secondary | ICD-10-CM | POA: Diagnosis not present

## 2017-12-27 DIAGNOSIS — E119 Type 2 diabetes mellitus without complications: Secondary | ICD-10-CM | POA: Diagnosis not present

## 2018-01-22 DIAGNOSIS — M4722 Other spondylosis with radiculopathy, cervical region: Secondary | ICD-10-CM | POA: Diagnosis not present

## 2018-01-22 DIAGNOSIS — G894 Chronic pain syndrome: Secondary | ICD-10-CM | POA: Diagnosis not present

## 2018-01-22 DIAGNOSIS — M542 Cervicalgia: Secondary | ICD-10-CM | POA: Diagnosis not present

## 2018-01-22 DIAGNOSIS — M5412 Radiculopathy, cervical region: Secondary | ICD-10-CM | POA: Diagnosis not present

## 2018-02-07 DIAGNOSIS — D631 Anemia in chronic kidney disease: Secondary | ICD-10-CM | POA: Diagnosis not present

## 2018-02-07 DIAGNOSIS — E118 Type 2 diabetes mellitus with unspecified complications: Secondary | ICD-10-CM | POA: Diagnosis not present

## 2018-02-07 DIAGNOSIS — E559 Vitamin D deficiency, unspecified: Secondary | ICD-10-CM | POA: Diagnosis not present

## 2018-02-07 DIAGNOSIS — I10 Essential (primary) hypertension: Secondary | ICD-10-CM | POA: Diagnosis not present

## 2018-02-07 DIAGNOSIS — Z125 Encounter for screening for malignant neoplasm of prostate: Secondary | ICD-10-CM | POA: Diagnosis not present

## 2018-02-13 DIAGNOSIS — Z0000 Encounter for general adult medical examination without abnormal findings: Secondary | ICD-10-CM | POA: Diagnosis not present

## 2018-02-13 DIAGNOSIS — E782 Mixed hyperlipidemia: Secondary | ICD-10-CM | POA: Diagnosis not present

## 2018-02-13 DIAGNOSIS — N183 Chronic kidney disease, stage 3 (moderate): Secondary | ICD-10-CM | POA: Diagnosis not present

## 2018-02-13 DIAGNOSIS — N2581 Secondary hyperparathyroidism of renal origin: Secondary | ICD-10-CM | POA: Diagnosis not present

## 2018-02-13 DIAGNOSIS — E118 Type 2 diabetes mellitus with unspecified complications: Secondary | ICD-10-CM | POA: Diagnosis not present

## 2018-02-13 DIAGNOSIS — K219 Gastro-esophageal reflux disease without esophagitis: Secondary | ICD-10-CM | POA: Diagnosis not present

## 2018-02-13 DIAGNOSIS — D631 Anemia in chronic kidney disease: Secondary | ICD-10-CM | POA: Diagnosis not present

## 2018-02-13 DIAGNOSIS — Z6829 Body mass index (BMI) 29.0-29.9, adult: Secondary | ICD-10-CM | POA: Diagnosis not present

## 2018-02-13 DIAGNOSIS — L403 Pustulosis palmaris et plantaris: Secondary | ICD-10-CM | POA: Diagnosis not present

## 2018-02-13 DIAGNOSIS — L98499 Non-pressure chronic ulcer of skin of other sites with unspecified severity: Secondary | ICD-10-CM | POA: Diagnosis not present

## 2018-02-13 DIAGNOSIS — N200 Calculus of kidney: Secondary | ICD-10-CM | POA: Diagnosis not present

## 2018-02-13 DIAGNOSIS — I10 Essential (primary) hypertension: Secondary | ICD-10-CM | POA: Diagnosis not present

## 2018-02-21 DIAGNOSIS — I10 Essential (primary) hypertension: Secondary | ICD-10-CM | POA: Diagnosis not present

## 2018-02-21 DIAGNOSIS — N183 Chronic kidney disease, stage 3 (moderate): Secondary | ICD-10-CM | POA: Diagnosis not present

## 2018-02-21 DIAGNOSIS — Z125 Encounter for screening for malignant neoplasm of prostate: Secondary | ICD-10-CM | POA: Diagnosis not present

## 2018-02-21 DIAGNOSIS — D631 Anemia in chronic kidney disease: Secondary | ICD-10-CM | POA: Diagnosis not present

## 2018-02-21 DIAGNOSIS — E118 Type 2 diabetes mellitus with unspecified complications: Secondary | ICD-10-CM | POA: Diagnosis not present

## 2018-02-27 DIAGNOSIS — E782 Mixed hyperlipidemia: Secondary | ICD-10-CM | POA: Diagnosis not present

## 2018-02-28 DIAGNOSIS — E782 Mixed hyperlipidemia: Secondary | ICD-10-CM | POA: Diagnosis not present

## 2018-02-28 DIAGNOSIS — I10 Essential (primary) hypertension: Secondary | ICD-10-CM | POA: Diagnosis not present

## 2018-02-28 DIAGNOSIS — L98499 Non-pressure chronic ulcer of skin of other sites with unspecified severity: Secondary | ICD-10-CM | POA: Diagnosis not present

## 2018-05-04 DIAGNOSIS — Z23 Encounter for immunization: Secondary | ICD-10-CM | POA: Diagnosis not present

## 2018-06-19 DIAGNOSIS — G8929 Other chronic pain: Secondary | ICD-10-CM | POA: Diagnosis not present

## 2018-06-19 DIAGNOSIS — E7800 Pure hypercholesterolemia, unspecified: Secondary | ICD-10-CM | POA: Diagnosis not present

## 2018-06-19 DIAGNOSIS — F329 Major depressive disorder, single episode, unspecified: Secondary | ICD-10-CM | POA: Diagnosis not present

## 2018-06-19 DIAGNOSIS — M47812 Spondylosis without myelopathy or radiculopathy, cervical region: Secondary | ICD-10-CM | POA: Diagnosis not present

## 2018-06-19 DIAGNOSIS — Z883 Allergy status to other anti-infective agents status: Secondary | ICD-10-CM | POA: Diagnosis not present

## 2018-06-19 DIAGNOSIS — Z888 Allergy status to other drugs, medicaments and biological substances status: Secondary | ICD-10-CM | POA: Diagnosis not present

## 2018-06-19 DIAGNOSIS — Z87891 Personal history of nicotine dependence: Secondary | ICD-10-CM | POA: Diagnosis not present

## 2018-06-19 DIAGNOSIS — E119 Type 2 diabetes mellitus without complications: Secondary | ICD-10-CM | POA: Diagnosis not present

## 2018-06-19 DIAGNOSIS — M549 Dorsalgia, unspecified: Secondary | ICD-10-CM | POA: Diagnosis not present

## 2018-06-19 DIAGNOSIS — I10 Essential (primary) hypertension: Secondary | ICD-10-CM | POA: Diagnosis not present

## 2018-07-10 DIAGNOSIS — Z881 Allergy status to other antibiotic agents status: Secondary | ICD-10-CM | POA: Diagnosis not present

## 2018-07-10 DIAGNOSIS — E119 Type 2 diabetes mellitus without complications: Secondary | ICD-10-CM | POA: Diagnosis not present

## 2018-07-10 DIAGNOSIS — Z87891 Personal history of nicotine dependence: Secondary | ICD-10-CM | POA: Diagnosis not present

## 2018-07-10 DIAGNOSIS — Z888 Allergy status to other drugs, medicaments and biological substances status: Secondary | ICD-10-CM | POA: Diagnosis not present

## 2018-07-10 DIAGNOSIS — M47812 Spondylosis without myelopathy or radiculopathy, cervical region: Secondary | ICD-10-CM | POA: Diagnosis not present

## 2018-07-10 DIAGNOSIS — I10 Essential (primary) hypertension: Secondary | ICD-10-CM | POA: Diagnosis not present

## 2018-07-10 DIAGNOSIS — E7800 Pure hypercholesterolemia, unspecified: Secondary | ICD-10-CM | POA: Diagnosis not present

## 2018-07-10 DIAGNOSIS — F329 Major depressive disorder, single episode, unspecified: Secondary | ICD-10-CM | POA: Diagnosis not present

## 2018-07-17 ENCOUNTER — Emergency Department (HOSPITAL_COMMUNITY): Payer: Medicare Other

## 2018-07-17 ENCOUNTER — Inpatient Hospital Stay (HOSPITAL_COMMUNITY)
Admission: EM | Admit: 2018-07-17 | Discharge: 2018-07-24 | DRG: 572 | Disposition: A | Discharge status: Home Health | Payer: Medicare Other | Attending: Internal Medicine | Admitting: Internal Medicine

## 2018-07-17 ENCOUNTER — Encounter (HOSPITAL_COMMUNITY): Payer: Self-pay

## 2018-07-17 ENCOUNTER — Other Ambulatory Visit: Payer: Self-pay

## 2018-07-17 DIAGNOSIS — N183 Chronic kidney disease, stage 3 unspecified: Secondary | ICD-10-CM | POA: Diagnosis present

## 2018-07-17 DIAGNOSIS — I10 Essential (primary) hypertension: Secondary | ICD-10-CM | POA: Diagnosis present

## 2018-07-17 DIAGNOSIS — E1122 Type 2 diabetes mellitus with diabetic chronic kidney disease: Secondary | ICD-10-CM | POA: Diagnosis not present

## 2018-07-17 DIAGNOSIS — E1169 Type 2 diabetes mellitus with other specified complication: Secondary | ICD-10-CM | POA: Diagnosis not present

## 2018-07-17 DIAGNOSIS — Z87891 Personal history of nicotine dependence: Secondary | ICD-10-CM

## 2018-07-17 DIAGNOSIS — I878 Other specified disorders of veins: Secondary | ICD-10-CM | POA: Diagnosis not present

## 2018-07-17 DIAGNOSIS — Z794 Long term (current) use of insulin: Secondary | ICD-10-CM | POA: Diagnosis not present

## 2018-07-17 DIAGNOSIS — I129 Hypertensive chronic kidney disease with stage 1 through stage 4 chronic kidney disease, or unspecified chronic kidney disease: Secondary | ICD-10-CM | POA: Diagnosis not present

## 2018-07-17 DIAGNOSIS — Z91048 Other nonmedicinal substance allergy status: Secondary | ICD-10-CM | POA: Diagnosis not present

## 2018-07-17 DIAGNOSIS — M109 Gout, unspecified: Secondary | ICD-10-CM | POA: Diagnosis present

## 2018-07-17 DIAGNOSIS — D631 Anemia in chronic kidney disease: Secondary | ICD-10-CM | POA: Diagnosis present

## 2018-07-17 DIAGNOSIS — E119 Type 2 diabetes mellitus without complications: Secondary | ICD-10-CM

## 2018-07-17 DIAGNOSIS — R609 Edema, unspecified: Secondary | ICD-10-CM | POA: Diagnosis not present

## 2018-07-17 DIAGNOSIS — L03116 Cellulitis of left lower limb: Secondary | ICD-10-CM

## 2018-07-17 DIAGNOSIS — L02415 Cutaneous abscess of right lower limb: Principal | ICD-10-CM | POA: Diagnosis present

## 2018-07-17 DIAGNOSIS — N182 Chronic kidney disease, stage 2 (mild): Secondary | ICD-10-CM | POA: Diagnosis not present

## 2018-07-17 DIAGNOSIS — E669 Obesity, unspecified: Secondary | ICD-10-CM | POA: Diagnosis present

## 2018-07-17 DIAGNOSIS — L02419 Cutaneous abscess of limb, unspecified: Secondary | ICD-10-CM

## 2018-07-17 DIAGNOSIS — Z8249 Family history of ischemic heart disease and other diseases of the circulatory system: Secondary | ICD-10-CM

## 2018-07-17 DIAGNOSIS — Z79899 Other long term (current) drug therapy: Secondary | ICD-10-CM | POA: Diagnosis not present

## 2018-07-17 DIAGNOSIS — B9561 Methicillin susceptible Staphylococcus aureus infection as the cause of diseases classified elsewhere: Secondary | ICD-10-CM | POA: Diagnosis not present

## 2018-07-17 DIAGNOSIS — M7989 Other specified soft tissue disorders: Secondary | ICD-10-CM | POA: Diagnosis not present

## 2018-07-17 DIAGNOSIS — Z8619 Personal history of other infectious and parasitic diseases: Secondary | ICD-10-CM | POA: Diagnosis not present

## 2018-07-17 DIAGNOSIS — Z888 Allergy status to other drugs, medicaments and biological substances status: Secondary | ICD-10-CM

## 2018-07-17 DIAGNOSIS — M79661 Pain in right lower leg: Secondary | ICD-10-CM | POA: Diagnosis not present

## 2018-07-17 DIAGNOSIS — L0390 Cellulitis, unspecified: Secondary | ICD-10-CM

## 2018-07-17 DIAGNOSIS — Z881 Allergy status to other antibiotic agents status: Secondary | ICD-10-CM | POA: Diagnosis not present

## 2018-07-17 DIAGNOSIS — E785 Hyperlipidemia, unspecified: Secondary | ICD-10-CM | POA: Diagnosis not present

## 2018-07-17 DIAGNOSIS — Z872 Personal history of diseases of the skin and subcutaneous tissue: Secondary | ICD-10-CM | POA: Diagnosis not present

## 2018-07-17 DIAGNOSIS — L03115 Cellulitis of right lower limb: Secondary | ICD-10-CM

## 2018-07-17 LAB — I-STAT CG4 LACTIC ACID, ED
LACTIC ACID, VENOUS: 3.38 mmol/L — AB (ref 0.5–1.9)
Lactic Acid, Venous: 3.46 mmol/L (ref 0.5–1.9)

## 2018-07-17 LAB — CBC WITH DIFFERENTIAL/PLATELET
ABS IMMATURE GRANULOCYTES: 0.06 10*3/uL (ref 0.00–0.07)
BASOS PCT: 1 %
Basophils Absolute: 0.1 10*3/uL (ref 0.0–0.1)
Eosinophils Absolute: 0.2 10*3/uL (ref 0.0–0.5)
Eosinophils Relative: 3 %
HCT: 45.8 % (ref 39.0–52.0)
HEMOGLOBIN: 14 g/dL (ref 13.0–17.0)
IMMATURE GRANULOCYTES: 1 %
Lymphocytes Relative: 21 %
Lymphs Abs: 1.8 10*3/uL (ref 0.7–4.0)
MCH: 27 pg (ref 26.0–34.0)
MCHC: 30.6 g/dL (ref 30.0–36.0)
MCV: 88.4 fL (ref 80.0–100.0)
MONOS PCT: 7 %
Monocytes Absolute: 0.6 10*3/uL (ref 0.1–1.0)
NEUTROS PCT: 67 %
Neutro Abs: 5.9 10*3/uL (ref 1.7–7.7)
PLATELETS: 348 10*3/uL (ref 150–400)
RBC: 5.18 MIL/uL (ref 4.22–5.81)
RDW: 12.7 % (ref 11.5–15.5)
WBC: 8.8 10*3/uL (ref 4.0–10.5)
nRBC: 0 % (ref 0.0–0.2)

## 2018-07-17 LAB — COMPREHENSIVE METABOLIC PANEL
ALBUMIN: 3.8 g/dL (ref 3.5–5.0)
ALT: 22 U/L (ref 0–44)
ANION GAP: 13 (ref 5–15)
AST: 24 U/L (ref 15–41)
Alkaline Phosphatase: 62 U/L (ref 38–126)
BUN: 22 mg/dL (ref 8–23)
CHLORIDE: 104 mmol/L (ref 98–111)
CO2: 22 mmol/L (ref 22–32)
Calcium: 9.7 mg/dL (ref 8.9–10.3)
Creatinine, Ser: 1.52 mg/dL — ABNORMAL HIGH (ref 0.61–1.24)
GFR calc Af Amer: 52 mL/min — ABNORMAL LOW (ref >60)
GFR calc non Af Amer: 45 mL/min — ABNORMAL LOW (ref >60)
GLUCOSE: 236 mg/dL — AB (ref 70–99)
Potassium: 4.5 mmol/L (ref 3.5–5.1)
Sodium: 139 mmol/L (ref 135–145)
TOTAL PROTEIN: 6.7 g/dL (ref 6.5–8.1)
Total Bilirubin: 0.7 mg/dL (ref 0.3–1.2)

## 2018-07-17 MED ORDER — VANCOMYCIN HCL 10 G IV SOLR
2000.0000 mg | Freq: Once | INTRAVENOUS | Status: AC
  Administered 2018-07-17: 2000 mg via INTRAVENOUS
  Filled 2018-07-17: qty 2000

## 2018-07-17 MED ORDER — ACETAMINOPHEN 325 MG PO TABS
650.0000 mg | ORAL_TABLET | Freq: Four times a day (QID) | ORAL | Status: DC | PRN
  Administered 2018-07-17: 650 mg via ORAL
  Filled 2018-07-17: qty 2

## 2018-07-17 MED ORDER — ACETAMINOPHEN 650 MG RE SUPP: 650.0000 mg | Freq: Four times a day (QID) | RECTAL | Status: DC | PRN

## 2018-07-17 MED ORDER — SODIUM CHLORIDE 0.9 % IV SOLN
2.0000 g | Freq: Once | INTRAVENOUS | Status: AC
  Filled 2018-07-17: qty 2

## 2018-07-17 MED ORDER — SODIUM CHLORIDE 0.9 % IV BOLUS
500.0000 mL | Freq: Once | INTRAVENOUS | Status: AC
  Administered 2018-07-17: 500 mL via INTRAVENOUS

## 2018-07-17 MED ORDER — GABAPENTIN 300 MG PO CAPS
600.0000 mg | ORAL_CAPSULE | Freq: Every day | ORAL | Status: DC
  Administered 2018-07-17 – 2018-07-23 (×7): 600 mg via ORAL
  Filled 2018-07-17 (×7): qty 2

## 2018-07-17 MED ORDER — ONDANSETRON HCL 4 MG/2ML IJ SOLN: 4.0000 mg | Freq: Four times a day (QID) | INTRAMUSCULAR | Status: DC | PRN

## 2018-07-17 MED ORDER — VITAMIN B-12 1000 MCG PO TABS
500.0000 ug | ORAL_TABLET | ORAL | Status: DC
  Administered 2018-07-18 – 2018-07-24 (×4): 500 ug via ORAL
  Filled 2018-07-17 (×5): qty 1

## 2018-07-17 MED ORDER — BUPROPION HCL ER (XL) 150 MG PO TB24
150.0000 mg | ORAL_TABLET | Freq: Every day | ORAL | Status: DC
  Administered 2018-07-18 – 2018-07-24 (×7): 150 mg via ORAL
  Filled 2018-07-17 (×7): qty 1

## 2018-07-17 MED ORDER — LISINOPRIL 5 MG PO TABS
5.0000 mg | ORAL_TABLET | Freq: Every day | ORAL | Status: DC
  Administered 2018-07-17: 5 mg via ORAL
  Filled 2018-07-17: qty 1

## 2018-07-17 MED ORDER — LISINOPRIL 5 MG PO TABS: 5.0000 mg | ORAL_TABLET | Freq: Every day | ORAL | Status: DC

## 2018-07-17 MED ORDER — SODIUM CHLORIDE 0.9 % IV BOLUS
1000.0000 mL | Freq: Once | INTRAVENOUS | Status: AC
  Administered 2018-07-17: 1000 mL via INTRAVENOUS

## 2018-07-17 MED ORDER — VANCOMYCIN HCL 10 G IV SOLR
1500.0000 mg | INTRAVENOUS | Status: DC
  Administered 2018-07-18 – 2018-07-21 (×4): 1500 mg via INTRAVENOUS
  Filled 2018-07-17 (×6): qty 1500

## 2018-07-17 MED ORDER — SODIUM CHLORIDE 0.9 % IV SOLN
2.0000 g | INTRAVENOUS | Status: DC
  Administered 2018-07-18: 2 g via INTRAVENOUS
  Filled 2018-07-17 (×2): qty 2

## 2018-07-17 MED ORDER — ONDANSETRON HCL 4 MG PO TABS: 4.0000 mg | ORAL_TABLET | Freq: Four times a day (QID) | ORAL | Status: DC | PRN

## 2018-07-17 MED ORDER — PIPERACILLIN-TAZOBACTAM 3.375 G IVPB 30 MIN
3.3750 g | Freq: Once | INTRAVENOUS | Status: AC
  Administered 2018-07-17: 3.375 g via INTRAVENOUS
  Filled 2018-07-17: qty 50

## 2018-07-17 MED ORDER — INSULIN ASPART 100 UNIT/ML ~~LOC~~ SOLN
0.0000 [IU] | Freq: Three times a day (TID) | SUBCUTANEOUS | Status: DC
  Administered 2018-07-18: 1 [IU] via SUBCUTANEOUS
  Administered 2018-07-18 – 2018-07-19 (×2): 2 [IU] via SUBCUTANEOUS
  Administered 2018-07-21: 1 [IU] via SUBCUTANEOUS
  Administered 2018-07-21 (×2): 2 [IU] via SUBCUTANEOUS
  Administered 2018-07-22: 1 [IU] via SUBCUTANEOUS
  Administered 2018-07-22 (×2): 2 [IU] via SUBCUTANEOUS
  Administered 2018-07-23 (×2): 1 [IU] via SUBCUTANEOUS
  Administered 2018-07-23 – 2018-07-24 (×2): 2 [IU] via SUBCUTANEOUS
  Administered 2018-07-24: 3 [IU] via SUBCUTANEOUS

## 2018-07-17 MED ORDER — ATORVASTATIN CALCIUM 40 MG PO TABS
40.0000 mg | ORAL_TABLET | Freq: Every day | ORAL | Status: DC
  Administered 2018-07-17 – 2018-07-23 (×7): 40 mg via ORAL
  Filled 2018-07-17 (×7): qty 1

## 2018-07-17 MED ORDER — ENOXAPARIN SODIUM 40 MG/0.4ML ~~LOC~~ SOLN
40.0000 mg | SUBCUTANEOUS | Status: DC
  Administered 2018-07-17 – 2018-07-22 (×6): 40 mg via SUBCUTANEOUS
  Filled 2018-07-17 (×6): qty 0.4

## 2018-07-17 MED ORDER — SODIUM CHLORIDE 0.9 % IV SOLN
INTRAVENOUS | Status: AC
  Administered 2018-07-18 (×2): via INTRAVENOUS

## 2018-07-17 NOTE — Progress Notes (Signed)
Pharmacy Antibiotic Note  James DustJonathan S Roberson is a 72 y.o. male admitted on 07/17/2018 with cellulitis.  Pharmacy has been consulted for vancomycin and cefepime dosing. Vancomycin 2gm given earlier today  Plan: Cefepime 2gm IV q24 hours Vancomycin 1500 mg IV q24 hours F/u renal function, cultures and clinical course  Height: 5\' 9"  (175.3 cm) Weight: 193 lb 5.5 oz (87.7 kg) IBW/kg (Calculated) : 70.7  Temp (24hrs), Avg:98.2 F (36.8 C), Min:97.8 F (36.6 C), Max:98.6 F (37 C)  Recent Labs  Lab 07/17/18 1621 07/17/18 1629 07/17/18 1844  WBC 8.8  --   --   CREATININE 1.52*  --   --   LATICACIDVEN  --  3.38* 3.46*    Estimated Creatinine Clearance: 48.2 mL/min (A) (by C-G formula based on SCr of 1.52 mg/dL (H)).    Allergies  Allergen Reactions  . Avelox Abc [Moxifloxacin] Anaphylaxis  . Tape Rash    PAPER TAPE ONLY!!  . Zolpidem Tartrate Other (See Comments)    Hallucinations  . Ambien Cr [Zolpidem Tartrate Er] Other (See Comments)     Hallucinations  . Zolpidem Other (See Comments)     Hallucinations - name brand only - tolerates generic   . Other Rash    Nuderm and Coban     Thank you for allowing pharmacy to be a part of this patient's care.  James Roberson, James Roberson 07/17/2018 11:38 PM

## 2018-07-17 NOTE — ED Notes (Signed)
Pt transported to ultrasound.

## 2018-07-17 NOTE — ED Provider Notes (Signed)
[facility] EMERGENCY DEPARTMENT Provider Note   CSN: [id number] Arrival date & time: 07/17/18  1605     History   Chief Complaint Chief Complaint  Patient presents with  . Recurrent Skin Infections    HPI James Roberson is a 72 y.o. male.  Patient with history of diabetes controlled on Jardiance and metformin, typically very well controlled blood sugars, history of multiple right lower extremity infections in the past requiring debridement in the OR as well as IV antibiotics --presents to the emergency department today with 2 weeks of worsening right lower extremity redness and pain.  Symptoms became much worse over the past 24 hours.  His sugars elevated into the 200s.  Patient denies any fevers.  No nausea, vomiting, or diarrhea.  Symptoms are similar to previous admissions where he required incision and drainage.     Past Medical History:  Diagnosis Date  . Diabetes mellitus without complication (HCC)   . Hyperlipidemia   . Hypertension   . Obesity     Patient Active Problem List   Diagnosis Date Noted  . CKD (chronic kidney disease) stage 3, GFR 30-59 ml/min (HCC) 11/11/2014  . Cellulitis 10/30/2014  . Diabetes mellitus type 2, controlled (HCC) 10/30/2014  . Hyperlipidemia 10/30/2014  . Renal failure (ARF), acute on chronic (HCC) 10/30/2014  . Sepsis (HCC) 10/30/2014  . ULCER, LEG 09/25/2008  . DIZZINESS 09/25/2008  . DIABETES MELLITUS, TYPE II 09/23/2008  . HYPERLIPIDEMIA 09/23/2008  . GOUT 09/23/2008  . Essential hypertension 09/23/2008  . PSORIASIS, HX OF 09/23/2008    Past Surgical History:  Procedure Laterality Date  . BACK SURGERY    . LEG SURGERY    . LUMBAR LAMINECTOMY  1989        Home Medications    Prior to Admission medications   Medication Sig Start Date End Date Taking? Authorizing Provider  acitretin (SORIATANE) 25 MG capsule Take 25 mg by mouth every other day. For psoriasis    [provider]    atorvastatin (LIPITOR) 40 MG tablet Take 20 mg by mouth at bedtime.     [provider]  buPROPion (WELLBUTRIN XL) 150 MG 24 hr tablet Take 150 mg by mouth daily.  10/21/14   [provider]  Choline Fenofibrate (TRILIPIX) 135 MG capsule Take 135 mg by mouth every other day.    [provider]  cyanocobalamin 500 MCG tablet Take 500 mcg by mouth daily.    [provider]  Emollient (LUBRIDERM SERIOUSLY SENSITIVE) LOTN Apply 1 application topically 2 (two) times daily. Patient not taking: Reported on 12/25/2016 11/03/14   Edsel Petrin, DO  HYDROcodone-acetaminophen (NORCO/VICODIN) 5-325 MG per tablet Take 1 tablet by mouth at bedtime.  10/28/14   [provider]  insulin detemir (LEVEMIR) 100 UNIT/ML injection Inject 0.18 mLs (18 Units total) into the skin at bedtime. Patient taking differently: Inject 16 Units into the skin at bedtime.  11/03/14   Mikhail, Nita Sells, DO  metFORMIN (GLUCOPHAGE-XR) 500 MG 24 hr tablet Take 1,000 mg by mouth 2 (two) times daily.    [provider]  ondansetron (ZOFRAN ODT) 4 MG disintegrating tablet Take 1 tablet (4 mg total) by mouth every 8 (eight) hours as needed for nausea or vomiting. Patient not taking: Reported on 01/05/2017 12/25/16   Elson Areas, PA-C  oxyCODONE-acetaminophen (PERCOCET/ROXICET) 5-325 MG tablet Take 2 tablets by mouth every 4 (four) hours as needed for severe pain. 12/25/16   Elson Areas, PA-C  saccharomyces boulardii (FLORASTOR) 250 MG capsule Take 1 capsule (250 mg total) by mouth 2 (two) times daily. Patient not taking: Reported on 12/25/2016 11/03/14   Edsel PetrinMikhail, Maryann, DO  sitaGLIPtin (JANUVIA) 100 MG tablet Take 50 mg by mouth at bedtime.     [provider]  tamsulosin (FLOMAX) 0.4 MG CAPS capsule Take 1 capsule (0.4 mg total) by mouth daily. Patient not taking: Reported on 01/05/2017 12/25/16   Elson AreasSofia, Leslie K, PA-C  valsartan-hydrochlorothiazide (DIOVAN-HCT) 320-25 MG per  tablet Take 1 tablet by mouth daily.    [provider]  vancomycin 1,500 mg in sodium chloride 0.9 % 500 mL Inject 1,500 mg into the vein daily. Patient not taking: Reported on 01/05/2017 11/17/14   Edsel PetrinMikhail, Maryann, DO  zolpidem (AMBIEN) 10 MG tablet Take 10 mg by mouth at bedtime.     [provider]    Family History Family History  Problem Relation Age of Onset  . CAD Father     Social History Social History   Tobacco Use  . Smoking status: Former Smoker    Last attempt to quit: 07/23/1966    Years since quitting: 52.0  . Smokeless tobacco: Never Used  Substance Use Topics  . Alcohol use: Yes    Comment: 1 a quarter  . Drug use: Not on file     Allergies   Avelox abc [moxifloxacin]; Tape; Ambien cr [zolpidem tartrate er]; and Ambien [zolpidem]   Review of Systems Review of Systems  Constitutional: Negative for fever.  HENT: Negative for rhinorrhea and sore throat.   Eyes: Negative for redness.  Respiratory: Negative for cough.   Cardiovascular: Negative for chest pain.  Gastrointestinal: Negative for abdominal pain, diarrhea, nausea and vomiting.  Genitourinary: Negative for dysuria.  Musculoskeletal: Positive for myalgias.  Skin: Positive for color change and rash.  Neurological: Negative for headaches.     Physical Exam Updated Vital Signs BP (!) 184/97 (BP Location: Right Arm)   Pulse (!) 109   Temp 98.6 F (37 C) (Oral)   Resp 18   SpO2 98%   Physical Exam  Constitutional: He appears well-developed and well-nourished.  HENT:  Head: Normocephalic and atraumatic.  Mouth/Throat: Oropharynx is clear and moist.  Eyes: Conjunctivae are normal. Right eye exhibits no discharge. Left eye exhibits no discharge.  Neck: Normal range of motion. Neck supple.  Cardiovascular: Normal rate, regular rhythm and normal heart sounds.  Pulmonary/Chest: Effort normal and breath sounds normal.  Abdominal: Soft. There is no tenderness.    Musculoskeletal: He exhibits edema and tenderness.  Patient with regular erythema and warmth of the right lower shin and calf consistent with cellulitis.  Patient has no discrete abscess but several areas of moderate induration.  Sensation intact distally.  Neurological: He is alert.  Skin: Skin is warm and dry.  Psychiatric: He has a normal mood and affect.  Nursing note and vitals reviewed.        ED Treatments / Results  Labs (all labs ordered are listed, but only abnormal results are displayed) Labs Reviewed  COMPREHENSIVE METABOLIC PANEL - Abnormal; Notable for the following components:      Result Value   Glucose, Bld 236 (*)    Creatinine, Ser 1.52 (*)    GFR calc non Af Amer 45 (*)    GFR calc Af Amer 52 (*)    All other components within normal limits  I-STAT CG4 LACTIC ACID, ED - Abnormal; Notable for the following components:   Lactic Acid,  Venous 3.38 (*)    All other components within normal limits  I-STAT CG4 LACTIC ACID, ED - Abnormal; Notable for the following components:   Lactic Acid, Venous 3.46 (*)    All other components within normal limits  CULTURE, BLOOD (ROUTINE X 2)  CULTURE, BLOOD (ROUTINE X 2)  CBC WITH DIFFERENTIAL/PLATELET    EKG None  Radiology Dg Tibia/fibula Right  Result Date: 07/17/2018 CLINICAL DATA:  Cellulitis EXAM: RIGHT TIBIA AND FIBULA - 2 VIEW COMPARISON:  11/10/2014 FINDINGS: No skeletal abnormality. No soft tissue mass or gas in the soft tissues. No foreign body. Mild calcification in the medial leg may represent dermal calcification and is unchanged from the prior study. IMPRESSION: No acute bony or soft tissue abnormality right lower leg Electronically Signed   By: Marlan Palau M.D.   On: 07/17/2018 19:22   [place] Rt Lower Extrem Ltd Soft Tissue Non Vascular  Result Date: 07/17/2018 CLINICAL DATA:  Cellulitis EXAM: ULTRASOUND right LOWER EXTREMITY LIMITED TECHNIQUE: Ultrasound examination of the lower extremity soft tissues  was performed in the area of clinical concern. COMPARISON:  Tib-fib x-rays from today FINDINGS: Scanning limited to the area of concern in the right lower leg soft tissues. There is subcutaneous edema. No fluid collection or mass lesion. Negative for abscess. Increased blood flow to the soft tissues on Doppler consistent with cellulitis IMPRESSION: Diffuse soft tissue swelling.  Negative for abscess right lower leg. Electronically Signed   By: Marlan Palau M.D.   On: 07/17/2018 19:21    Procedures Procedures (including critical care time)  Medications Ordered in ED Medications  piperacillin-tazobactam (ZOSYN) IVPB 3.375 g (has no administration in time range)  vancomycin (VANCOCIN) 2,000 mg in sodium chloride 0.9 % 500 mL IVPB (has no administration in time range)  sodium chloride 0.9 % bolus 1,000 mL (1,000 mLs Intravenous New Bag/Given 07/17/18 1932)     Initial Impression / Assessment and Plan / ED Course  I have reviewed the triage vital signs and the nursing notes.  Pertinent labs & imaging results that were available during my care of the patient were reviewed by me and considered in my medical decision making (see chart for details).     Patient seen and examined.  Patient with elevated lactic acid, normal white count, afebrile.  Work-up initiated. Medications ordered.   Vital signs reviewed and are as follows: BP (!) 184/97 (BP Location: Right Arm)   Pulse (!) 109   Temp 98.6 F (37 C) (Oral)   Resp 18   SpO2 98%   Second lactate also elevated.  This was before administration of IV fluids.  Antibiotics and blood cultures ordered.  Patient will need admission given history of diabetes and previous course along with elevated lactic acid.  Patient discussed with and seen by Dr. Pilar Plate.   8:10 PM patient stable, pain is controlled.  Updated on results.  Will request hospitalist admission.  8:29 PM Spoke with Dr. Toniann Fail who will see patient.   Final Clinical Impressions(s)  / ED Diagnoses   Final diagnoses:  Cellulitis   Admit, elevated lactate, h/o same. No drainable abscess at this time.   ED Discharge Orders    None       [name], Cordelia Poche 07/17/18 2030    Sabas Sous, MD 07/18/18 [id number]

## 2018-07-17 NOTE — ED Triage Notes (Signed)
Pt reports he has had recurrent infections on his right leg for several years. Pt states he has never had an actual diagnosis. Pt reports this episode lasting approximately 2 weeks. Worsening X24 hours.

## 2018-07-17 NOTE — H&P (Addendum)
History and Physical    James Roberson XBJ:[id number] DOB: 05-22-1946 DOA: 07/17/2018  PCP: Patient, No Pcp Per  Patient coming from: Home.  Chief Complaint: Right lower extremity swelling and pain.  HPI: James DustJonathan S Roberson is a 72 y.o. male with history of diabetes mellitus type 2, hypertension, hyperlipidemia presents to the ER because of increasing pain in the right lower extremity.  Patient states that he has been noticing increasing pain and swelling over the last few weeks but last 2 days it acutely worsened.  Denies any fever chills denies any trauma.  Patient has had recurrent cellulitis of the right lower extremity and has had required surgeries previously.  ED Course: In the ER x-rays do not show anything acute and sonogram of the soft tissue does not show any abscess.  Given the pain and swelling and previous history of requiring surgery with elevated lactate levels patient has been admitted for IV antibiotics and further observation.  Was patient was given fluid bolus for lactate level but does not look up septic.  Review of Systems: As per HPI, rest all negative.   Past Medical History:  Diagnosis Date  . Diabetes mellitus without complication (HCC)   . Hyperlipidemia   . Hypertension   . Obesity     Past Surgical History:  Procedure Laterality Date  . BACK SURGERY    . LEG SURGERY    . LUMBAR LAMINECTOMY  1989     reports that he quit smoking about 52 years ago. He has never used smokeless tobacco. He reports that he drinks alcohol. His drug history is not on file.  Allergies  Allergen Reactions  . Avelox Abc [Moxifloxacin] Anaphylaxis  . Tape Rash    PAPER TAPE ONLY!!  . Zolpidem Tartrate Other (See Comments)    Hallucinations  . Ambien Cr [Zolpidem Tartrate Er] Other (See Comments)     Hallucinations  . Zolpidem Other (See Comments)     Hallucinations - name brand only - tolerates generic   . Other Rash    Nuderm and Coban     Family History    Problem Relation Age of Onset  . CAD Father     Prior to Admission medications   Medication Sig Start Date End Date Taking? Authorizing Provider  atorvastatin (LIPITOR) 40 MG tablet Take 40 mg by mouth at bedtime.    Yes [provider]  buPROPion (WELLBUTRIN XL) 150 MG 24 hr tablet Take 150 mg by mouth daily.  10/21/14  Yes [provider]  celecoxib (CELEBREX) 100 MG capsule Take 100 mg by mouth at bedtime.  05/15/18  Yes [provider]  cyanocobalamin 500 MCG tablet Take 500 mcg by mouth every other day.    Yes [provider]  gabapentin (NEURONTIN) 300 MG capsule Take 600 mg by mouth at bedtime.   Yes [provider]  Lidocaine 5 % CREA Apply 1 application topically See admin instructions. Apply to affected area 3 times a day as directed 07/11/18  Yes [provider]  lisinopril (PRINIVIL,ZESTRIL) 5 MG tablet Take 5 mg by mouth at bedtime.   Yes [provider]  metFORMIN (GLUCOPHAGE-XR) 500 MG 24 hr tablet Take 1,000 mg by mouth 2 (two) times daily.   Yes [provider]  TRULICITY 1.5 MG/0.5ML SOPN Inject 0.5 mLs into the skin every Friday. 05/05/18  Yes [provider]  Emollient (LUBRIDERM SERIOUSLY SENSITIVE) LOTN Apply 1 application topically 2 (two) times daily. Patient not taking: Reported  on 07/17/2018 11/03/14   Edsel Petrin, DO  insulin detemir (LEVEMIR) 100 UNIT/ML injection Inject 0.18 mLs (18 Units total) into the skin at bedtime. Patient not taking: Reported on 07/17/2018 11/03/14   Edsel Petrin, DO  ondansetron (ZOFRAN ODT) 4 MG disintegrating tablet Take 1 tablet (4 mg total) by mouth every 8 (eight) hours as needed for nausea or vomiting. Patient not taking: Reported on 01/05/2017 12/25/16   Elson Areas, PA-C  oxyCODONE-acetaminophen (PERCOCET/ROXICET) 5-325 MG tablet Take 2 tablets by mouth every 4 (four) hours as needed for severe pain. Patient not taking: Reported on 07/17/2018  12/25/16   Elson Areas, PA-C  saccharomyces boulardii (FLORASTOR) 250 MG capsule Take 1 capsule (250 mg total) by mouth 2 (two) times daily. Patient not taking: Reported on 12/25/2016 11/03/14   Edsel Petrin, DO  tamsulosin (FLOMAX) 0.4 MG CAPS capsule Take 1 capsule (0.4 mg total) by mouth daily. Patient not taking: Reported on 01/05/2017 12/25/16   Elson Areas, PA-C  vancomycin 1,500 mg in sodium chloride 0.9 % 500 mL Inject 1,500 mg into the vein daily. Patient not taking: Reported on 01/05/2017 11/17/14   Edsel Petrin, DO    Physical Exam: Vitals:   07/17/18 1945 07/17/18 2000 07/17/18 2015 07/17/18 2234  BP: (!) 142/74 (!) 155/85 (!) 157/75 (!) 176/97  Pulse: 87 90 93 (!) 104  Resp:   18 14  Temp:    97.8 F (36.6 C)  TempSrc:      SpO2: 99% 99% 98% 100%      Constitutional: Moderately built and nourished. Vitals:   07/17/18 1945 07/17/18 2000 07/17/18 2015 07/17/18 2234  BP: (!) 142/74 (!) 155/85 (!) 157/75 (!) 176/97  Pulse: 87 90 93 (!) 104  Resp:   18 14  Temp:    97.8 F (36.6 C)  TempSrc:      SpO2: 99% 99% 98% 100%   Eyes: Anicteric no pallor. ENMT: No discharge from the ears eyes nose or mouth. Neck: No mass or.  No neck rigidity. Respiratory: No rhonchi or crepitations. Cardiovascular: S1-S2 heard. Abdomen: Soft nontender bowel sounds present. Musculoskeletal: Right lower extremity around the calf area is swollen tender to touch.  No limitation of joint movements. Skin: Right calf is mildly swollen and tender to touch. Neurologic: Alert awake oriented to time place and person.  Moves all extremities. Psychiatric: Appears normal per normal affect.   Labs on Admission: I have personally reviewed following labs and imaging studies  CBC: Recent Labs  Lab 07/17/18 1621  WBC 8.8  NEUTROABS 5.9  HGB 14.0  HCT 45.8  MCV 88.4  PLT 348   Basic Metabolic Panel: Recent Labs  Lab 07/17/18 1621  NA 139  K 4.5  CL 104  CO2 22  GLUCOSE 236*    BUN 22  CREATININE 1.52*  CALCIUM 9.7   GFR: CrCl cannot be calculated (Unknown ideal weight.). Liver Function Tests: Recent Labs  Lab 07/17/18 1621  AST 24  ALT 22  ALKPHOS 62  BILITOT 0.7  PROT 6.7  ALBUMIN 3.8   No results for input(s): LIPASE, AMYLASE in the last 168 hours. No results for input(s): AMMONIA in the last 168 hours. Coagulation Profile: No results for input(s): INR, PROTIME in the last 168 hours. Cardiac Enzymes: No results for input(s): CKTOTAL, CKMB, CKMBINDEX, TROPONINI in the last 168 hours. BNP (last 3 results) No results for input(s): PROBNP in the last 8760 hours. HbA1C: No results for input(s): HGBA1C in the last  72 hours. CBG: No results for input(s): GLUCAP in the last 168 hours. Lipid Profile: No results for input(s): CHOL, HDL, LDLCALC, TRIG, CHOLHDL, LDLDIRECT in the last 72 hours. Thyroid Function Tests: No results for input(s): TSH, T4TOTAL, FREET4, T3FREE, THYROIDAB in the last 72 hours. Anemia Panel: No results for input(s): VITAMINB12, FOLATE, FERRITIN, TIBC, IRON, RETICCTPCT in the last 72 hours. Urine analysis:    Component Value Date/Time   COLORURINE YELLOW 12/25/2016 1721   APPEARANCEUR CLEAR 12/25/2016 1721   LABSPEC 1.013 12/25/2016 1721   PHURINE 5.0 12/25/2016 1721   GLUCOSEU 50 (A) 12/25/2016 1721   HGBUR MODERATE (A) 12/25/2016 1721   BILIRUBINUR NEGATIVE 12/25/2016 1721   KETONESUR NEGATIVE 12/25/2016 1721   PROTEINUR NEGATIVE 12/25/2016 1721   UROBILINOGEN 0.2 10/30/2014 0004   NITRITE NEGATIVE 12/25/2016 1721   LEUKOCYTESUR NEGATIVE 12/25/2016 1721   Sepsis Labs: @LABRCNTIP (procalcitonin:4,lacticidven:4) )No results found for this or any previous visit (from the past 240 hour(s)).   Radiological Exams on Admission: Dg Tibia/fibula Right  Result Date: 07/17/2018 CLINICAL DATA:  Cellulitis EXAM: RIGHT TIBIA AND FIBULA - 2 VIEW COMPARISON:  11/10/2014 FINDINGS: No skeletal abnormality. No soft tissue mass or  gas in the soft tissues. No foreign body. Mild calcification in the medial leg may represent dermal calcification and is unchanged from the prior study. IMPRESSION: No acute bony or soft tissue abnormality right lower leg Electronically Signed   By: Marlan Palau M.D.   On: 07/17/2018 19:22   [place] Rt Lower Extrem Ltd Soft Tissue Non Vascular  Result Date: 07/17/2018 CLINICAL DATA:  Cellulitis EXAM: ULTRASOUND right LOWER EXTREMITY LIMITED TECHNIQUE: Ultrasound examination of the lower extremity soft tissues was performed in the area of clinical concern. COMPARISON:  Tib-fib x-rays from today FINDINGS: Scanning limited to the area of concern in the right lower leg soft tissues. There is subcutaneous edema. No fluid collection or mass lesion. Negative for abscess. Increased blood flow to the soft tissues on Doppler consistent with cellulitis IMPRESSION: Diffuse soft tissue swelling.  Negative for abscess right lower leg. Electronically Signed   By: Marlan Palau M.D.   On: 07/17/2018 19:21    Assessment/Plan Principal Problem:   Cellulitis of right lower extremity Active Problems:   GOUT   Essential hypertension   Cellulitis   CKD (chronic kidney disease) stage 3, GFR 30-59 ml/min (HCC)   Diabetes mellitus type 2 in obese (HCC)    1. Right lower extremity cellulitis -complains of significant pain 8 out of 10.  We will keep patient on tramadol (Patient wants to avoid narcotics) for pain relief.  Continue antibiotics.  Since patient has significant pain will get MRI.  No signs of any compartment syndrome.  Has good pulses and sensation. 2. Hypertension on lisinopril.  Note that patient has chronic kidney disease stage.. 3. Diabetes mellitus type 2 we will keep patient on sliding scale coverage while inpatient. 4. Elevated lactate levels -does not look septic.  Will hold metformin and hydrate. 5. Chronic kidney disease stage III creatinine appears to be at baseline.  Note that patient is on  lisinopril. 6. Mild anemia follow CBC.  Appears to be chronic. 7. History of gout.   DVT prophylaxis: Lovenox. Code Status: Full code. Family Communication: Discussed with patient. Disposition Plan: Home. Consults called: None. Admission status: Inpatient.   Eduard Clos MD [facility] Pager [phone].  If 7PM-7AM, please contact night-coverage [email or web address] Password [facility]  07/17/2018, 10:49 PM

## 2018-07-18 ENCOUNTER — Inpatient Hospital Stay (HOSPITAL_COMMUNITY): Payer: Medicare Other

## 2018-07-18 DIAGNOSIS — I10 Essential (primary) hypertension: Secondary | ICD-10-CM

## 2018-07-18 DIAGNOSIS — E1169 Type 2 diabetes mellitus with other specified complication: Secondary | ICD-10-CM

## 2018-07-18 DIAGNOSIS — L03115 Cellulitis of right lower limb: Secondary | ICD-10-CM

## 2018-07-18 DIAGNOSIS — N183 Chronic kidney disease, stage 3 (moderate): Secondary | ICD-10-CM

## 2018-07-18 DIAGNOSIS — E669 Obesity, unspecified: Secondary | ICD-10-CM

## 2018-07-18 LAB — HEPATIC FUNCTION PANEL
ALBUMIN: 3.2 g/dL — AB (ref 3.5–5.0)
ALT: 21 U/L (ref 0–44)
AST: 18 U/L (ref 15–41)
Alkaline Phosphatase: 48 U/L (ref 38–126)
Bilirubin, Direct: <0.1 mg/dL (ref 0.0–0.2)
Total Bilirubin: 0.8 mg/dL (ref 0.3–1.2)
Total Protein: 5.9 g/dL — ABNORMAL LOW (ref 6.5–8.1)

## 2018-07-18 LAB — CBC
HCT: 38 % — ABNORMAL LOW (ref 39.0–52.0)
Hemoglobin: 12 g/dL — ABNORMAL LOW (ref 13.0–17.0)
MCH: 27.3 pg (ref 26.0–34.0)
MCHC: 31.6 g/dL (ref 30.0–36.0)
MCV: 86.6 fL (ref 80.0–100.0)
Platelets: 277 10*3/uL (ref 150–400)
RBC: 4.39 MIL/uL (ref 4.22–5.81)
RDW: 12.7 % (ref 11.5–15.5)
WBC: 9.2 10*3/uL (ref 4.0–10.5)
nRBC: 0 % (ref 0.0–0.2)

## 2018-07-18 LAB — BASIC METABOLIC PANEL
Anion gap: 8 (ref 5–15)
BUN: 19 mg/dL (ref 8–23)
CO2: 22 mmol/L (ref 22–32)
Calcium: 8.8 mg/dL — ABNORMAL LOW (ref 8.9–10.3)
Chloride: 111 mmol/L (ref 98–111)
Creatinine, Ser: 1.34 mg/dL — ABNORMAL HIGH (ref 0.61–1.24)
GFR calc Af Amer: >60 mL/min (ref >60)
GFR calc non Af Amer: 53 mL/min — ABNORMAL LOW (ref >60)
Glucose, Bld: 143 mg/dL — ABNORMAL HIGH (ref 70–99)
Potassium: 5 mmol/L (ref 3.5–5.1)
Sodium: 141 mmol/L (ref 135–145)

## 2018-07-18 LAB — LACTIC ACID, PLASMA
Lactic Acid, Venous: 2.6 mmol/L (ref 0.5–1.9)
Lactic Acid, Venous: 2.6 mmol/L (ref 0.5–1.9)

## 2018-07-18 LAB — C-REACTIVE PROTEIN: CRP: 1.1 mg/dL — ABNORMAL HIGH (ref <1.0)

## 2018-07-18 LAB — GLUCOSE, CAPILLARY
GLUCOSE-CAPILLARY: 90 mg/dL (ref 70–99)
Glucose-Capillary: 111 mg/dL — ABNORMAL HIGH (ref 70–99)
Glucose-Capillary: 149 mg/dL — ABNORMAL HIGH (ref 70–99)
Glucose-Capillary: 168 mg/dL — ABNORMAL HIGH (ref 70–99)
Glucose-Capillary: 133 mg/dL — ABNORMAL HIGH (ref 70–99)

## 2018-07-18 LAB — SEDIMENTATION RATE: Sed Rate: 25 mm/hr — ABNORMAL HIGH (ref 0–16)

## 2018-07-18 MED ORDER — TRAMADOL HCL 50 MG PO TABS
50.0000 mg | ORAL_TABLET | Freq: Four times a day (QID) | ORAL | Status: DC | PRN
  Administered 2018-07-18 (×2): 50 mg via ORAL
  Filled 2018-07-18 (×2): qty 1

## 2018-07-18 MED ORDER — HYDRALAZINE HCL 20 MG/ML IJ SOLN: 5.0000 mg | INTRAMUSCULAR | Status: DC | PRN

## 2018-07-18 MED ORDER — SODIUM CHLORIDE 0.9 % IV BOLUS
500.0000 mL | Freq: Once | INTRAVENOUS | Status: AC
  Administered 2018-07-18: 500 mL via INTRAVENOUS

## 2018-07-18 MED ORDER — LISINOPRIL 10 MG PO TABS
10.0000 mg | ORAL_TABLET | Freq: Every day | ORAL | Status: DC
  Administered 2018-07-18 – 2018-07-23 (×6): 10 mg via ORAL
  Filled 2018-07-18 (×6): qty 1

## 2018-07-18 MED ORDER — MORPHINE SULFATE (PF) 2 MG/ML IV SOLN
1.0000 mg | INTRAVENOUS | Status: DC | PRN
  Administered 2018-07-19 – 2018-07-20 (×2): 1 mg via INTRAVENOUS
  Filled 2018-07-18 (×2): qty 1

## 2018-07-18 MED ORDER — OXYCODONE HCL 5 MG PO TABS
5.0000 mg | ORAL_TABLET | Freq: Four times a day (QID) | ORAL | Status: DC | PRN
  Administered 2018-07-18 – 2018-07-20 (×3): 5 mg via ORAL
  Filled 2018-07-18 (×3): qty 1

## 2018-07-18 NOTE — Progress Notes (Signed)
CRITICAL VALUE ALERT  Critical Value:  Lactic Acid 2.6  Date & Time Notied:  07/18/18 0205  Provider Notified:Schorr,NP Orders Received/Actions taken:

## 2018-07-18 NOTE — Progress Notes (Signed)
PROGRESS NOTE        PATIENT DETAILS Name: James Roberson Age: 72 y.o. Sex: male Date of Birth: [birth year] Admit Date: 07/17/2018 Admitting Physician Eduard Clos, MD ZOX:[id number], No Pcp Per  Brief Narrative: Patient is a 72 y.o. male with history of DM-2, hypertension, dyslipidemia-presenting with erythema, swelling of the right lower extremity.  MRI of the right lower extremity demonstrating small subcutaneous abscesses.  See below for further details  Subjective: Continues to have pain in the right lower extremity-appears to have a small indurated area in the back of his right calf.  Assessment/Plan: Right lower extremity cellulitis with small abscesses: Patient does not appear septic or toxic-he is stable-we will continue with vancomycin-do not think he requires cefepime.  Ortho-Dr Roda Shutters- consulted for I&D.    Hypertension: Blood pressure elevated-continue lisinopril-but will increase dosage to 10 mg.  DM-2: CBG stable with SSI.  Resume oral hypoglycemics on discharge  Dyslipidemia: Continue statin  CKD stage III: Creatinine close to usual baseline.  Gout: Appears to be stable without any evidence of flare  DVT Prophylaxis: Prophylactic Lovenox  Code Status: Full code  Family Communication: None at bedside  Disposition Plan: Remain inpatient  Antimicrobial agents: Anti-infectives (From admission, onward)   Start     Dose/Rate Route Frequency Ordered Stop   07/18/18 2000  vancomycin (VANCOCIN) 1,500 mg in sodium chloride 0.9 % 500 mL IVPB     1,500 mg 250 mL/hr over 120 Minutes Intravenous Every 24 hours 07/17/18 2348     07/18/18 0000  ceFEPIme (MAXIPIME) 2 g in sodium chloride 0.9 % 100 mL IVPB     2 g 200 mL/hr over 30 Minutes Intravenous Every 24 hours 07/17/18 2348     07/17/18 2315  ceFEPIme (MAXIPIME) 2 g in sodium chloride 0.9 % 100 mL IVPB     2 g 200 mL/hr over 30 Minutes Intravenous  Once 07/17/18 2313 07/18/18 0038   07/17/18 2000  vancomycin (VANCOCIN) 2,000 mg in sodium chloride 0.9 % 500 mL IVPB     2,000 mg 250 mL/hr over 120 Minutes Intravenous  Once 07/17/18 1840 07/17/18 2223   07/17/18 1900  piperacillin-tazobactam (ZOSYN) IVPB 3.375 g     3.375 g 100 mL/hr over 30 Minutes Intravenous  Once 07/17/18 1840 07/17/18 2129      Procedures: None  CONSULTS:  orthopedic surgery  Time spent: 25- minutes-Greater than 50% of this time was spent in counseling, explanation of diagnosis, planning of further management, and coordination of care.  MEDICATIONS: Scheduled Meds: . atorvastatin  40 mg Oral QHS  . buPROPion  150 mg Oral Daily  . enoxaparin (LOVENOX) injection  40 mg Subcutaneous Q24H  . gabapentin  600 mg Oral QHS  . insulin aspart  0-9 Units Subcutaneous TID WC  . lisinopril  5 mg Oral QHS  . vitamin B-12  500 mcg Oral QODAY   Continuous Infusions: . sodium chloride 100 mL/hr at 07/18/18 1236  . ceFEPime (MAXIPIME) IV Stopped (07/18/18 0200)  . vancomycin     PRN Meds:.acetaminophen **OR** acetaminophen, hydrALAZINE, ondansetron **OR** ondansetron (ZOFRAN) IV, traMADol   PHYSICAL EXAM: Vital signs: Vitals:   07/17/18 2015 07/17/18 2234 07/17/18 2300 07/18/18 0751  BP: (!) 157/75 (!) 176/97  (!) 147/84  Pulse: 93 (!) 104  92  Resp: 18 14    Temp:  97.8 F (  36.6 C)  97.8 F (36.6 C)  TempSrc:    Oral  SpO2: 98% 100%  99%  Weight:   87.7 kg   Height:   5\' 9"  (1.753 m)    Filed Weights   07/17/18 2300  Weight: 87.7 kg   Body mass index is 28.55 kg/m.   General appearance :Awake, alert, not in any distress.  HEENT: Atraumatic and Normocephalic Neck: supple Resp:Good air entry bilaterally, no added sounds  CVS: S1 S2 regular, no murmurs.  GI: Bowel sounds present, Non tender and not distended with no gaurding, rigidity or rebound.No organomegaly Extremities: B/L Lower Ext shows no edema, both legs are warm to touch.  Small indurated areas in the back of his right  calf. Neurology:  speech clear,Non focal, sensation is grossly intact. Psychiatric: Normal judgment and insight. Alert and oriented x 3. Normal mood. Musculoskeletal:No digital cyanosis Skin:No Rash, warm and dry Wounds:N/A  I have personally reviewed following labs and imaging studies  LABORATORY DATA: CBC: Recent Labs  Lab 07/17/18 1621 07/18/18 0124  WBC 8.8 9.2  NEUTROABS 5.9  --   HGB 14.0 12.0*  HCT 45.8 38.0*  MCV 88.4 86.6  PLT 348 277    Basic Metabolic Panel: Recent Labs  Lab 07/17/18 1621 07/18/18 0124  NA 139 141  K 4.5 5.0  CL 104 111  CO2 22 22  GLUCOSE 236* 143*  BUN 22 19  CREATININE 1.52* 1.34*  CALCIUM 9.7 8.8*    GFR: Estimated Creatinine Clearance: 54.6 mL/min (A) (by C-G formula based on SCr of 1.34 mg/dL (H)).  Liver Function Tests: Recent Labs  Lab 07/17/18 1621 07/18/18 0124  AST 24 18  ALT 22 21  ALKPHOS 62 48  BILITOT 0.7 0.8  PROT 6.7 5.9*  ALBUMIN 3.8 3.2*   No results for input(s): LIPASE, AMYLASE in the last 168 hours. No results for input(s): AMMONIA in the last 168 hours.  Coagulation Profile: No results for input(s): INR, PROTIME in the last 168 hours.  Cardiac Enzymes: No results for input(s): CKTOTAL, CKMB, CKMBINDEX, TROPONINI in the last 168 hours.  BNP (last 3 results) No results for input(s): PROBNP in the last 8760 hours.  HbA1C: No results for input(s): HGBA1C in the last 72 hours.  CBG: Recent Labs  Lab 07/17/18 2331 07/18/18 0840 07/18/18 1209  GLUCAP 111* 90 149*    Lipid Profile: No results for input(s): CHOL, HDL, LDLCALC, TRIG, CHOLHDL, LDLDIRECT in the last 72 hours.  Thyroid Function Tests: No results for input(s): TSH, T4TOTAL, FREET4, T3FREE, THYROIDAB in the last 72 hours.  Anemia Panel: No results for input(s): VITAMINB12, FOLATE, FERRITIN, TIBC, IRON, RETICCTPCT in the last 72 hours.  Urine analysis:    Component Value Date/Time   COLORURINE YELLOW 12/25/2016 1721    APPEARANCEUR CLEAR 12/25/2016 1721   LABSPEC 1.013 12/25/2016 1721   PHURINE 5.0 12/25/2016 1721   GLUCOSEU 50 (A) 12/25/2016 1721   HGBUR MODERATE (A) 12/25/2016 1721   BILIRUBINUR NEGATIVE 12/25/2016 1721   KETONESUR NEGATIVE 12/25/2016 1721   PROTEINUR NEGATIVE 12/25/2016 1721   UROBILINOGEN 0.2 10/30/2014 0004   NITRITE NEGATIVE 12/25/2016 1721   LEUKOCYTESUR NEGATIVE 12/25/2016 1721    Sepsis Labs: Lactic Acid, Venous    Component Value Date/Time   LATICACIDVEN 2.6 (HH) 07/18/2018 0316    MICROBIOLOGY: Recent Results (from the past 240 hour(s))  Blood culture (routine x 2)     Status: None (Preliminary result)   Collection Time: 07/17/18  7:50 PM  Result Value Ref Range Status   Specimen Description BLOOD LEFT ANTECUBITAL  Final   Special Requests   Final    BOTTLES DRAWN AEROBIC AND ANAEROBIC Blood Culture adequate volume   Culture   Final    NO GROWTH < 24 HOURS Performed at [facility], [address] [place], [place] [address]    Report Status PENDING  Incomplete  Blood culture (routine x 2)     Status: None (Preliminary result)   Collection Time: 07/17/18  7:57 PM  Result Value Ref Range Status   Specimen Description BLOOD LEFT HAND  Final   Special Requests   Final    BOTTLES DRAWN AEROBIC AND ANAEROBIC Blood Culture results may not be optimal due to an inadequate volume of blood received in culture bottles   Culture   Final    NO GROWTH < 24 HOURS Performed at [facility], [address] [place], [place] [address]    Report Status PENDING  Incomplete    RADIOLOGY STUDIES/RESULTS: Dg Tibia/fibula Right  Result Date: 07/17/2018 CLINICAL DATA:  Cellulitis EXAM: RIGHT TIBIA AND FIBULA - 2 VIEW COMPARISON:  11/10/2014 FINDINGS: No skeletal abnormality. No soft tissue mass or gas in the soft tissues. No foreign body. Mild calcification in the medial leg may represent dermal calcification and is unchanged from the prior study. IMPRESSION: No  acute bony or soft tissue abnormality right lower leg Electronically Signed   By: Marlan Palauharles  Clark M.D.   On: 07/17/2018 19:22   Mr Tibia Fibula Right Wo Contrast  Result Date: 07/18/2018 CLINICAL DATA:  Right lower leg pain and swelling over the past 2 weeks which worsened over the past 2 days. The patient is diabetic. EXAM: MRI OF LOWER RIGHT EXTREMITY WITHOUT CONTRAST TECHNIQUE: Multiplanar, multisequence MR imaging of the right lower leg was performed. No intravenous contrast was administered. COMPARISON:  Plain films right lower leg 07/17/2018. FINDINGS: Bones/Joint/Cartilage Bone marrow signal is normal throughout without fracture, stress change or focal lesion. No evidence of osteomyelitis. Ligaments Negative. Muscles and Tendons Mildly increased T2 signal is seen in the distal aspect of the medial gastrocnemius and underlying medial periphery of the soleus. No intramuscular fluid collection. No tear. There is some fatty atrophy of lower leg musculature bilaterally which appears symmetric. Soft tissues Subcutaneous increased T2 signal in the lower legs bilaterally is much worse on the right. A fluid collection in the posterior, medial soft tissues of the right lower leg measures 0.8 cm transverse x 0.9 cm AP x 1.3 cm craniocaudal. A second small fluid collection more inferiorly in the subcutaneous tissues measures 0.8 cm craniocaudal by 0.6 cm transverse by 0.6 cm AP. A third tiny fluid collection in the subcutaneous tissues measuring 0.9 cm is identified. Finally, there is a fourth fluid collection in the subcutaneous tissues lateral to the Achilles tendon which measures 0.6 cm in diameter. IMPRESSION: Extensive subcutaneous edema about the right lower leg is compatible with cellulitis. Mild edema in the inferior aspect of the medial gastrocnemius and lateral periphery of the underlying soleus could be due to myositis or related to denervation atrophy. Four subcutaneous fluid collections measuring 1.3 cm  or less in diameter in the right lower leg as described above are worrisome for abscesses. Electronically Signed   By: Drusilla Kannerhomas  Dalessio M.D.   On: 07/18/2018 12:34   [place] Rt Lower Extrem Ltd Soft Tissue Non Vascular  Result Date: 07/17/2018 CLINICAL DATA:  Cellulitis EXAM: ULTRASOUND right LOWER EXTREMITY LIMITED TECHNIQUE:  Ultrasound examination of the lower extremity soft tissues was performed in the area of clinical concern. COMPARISON:  Tib-fib x-rays from today FINDINGS: Scanning limited to the area of concern in the right lower leg soft tissues. There is subcutaneous edema. No fluid collection or mass lesion. Negative for abscess. Increased blood flow to the soft tissues on Doppler consistent with cellulitis IMPRESSION: Diffuse soft tissue swelling.  Negative for abscess right lower leg. Electronically Signed   By: Marlan Palau M.D.   On: 07/17/2018 19:21     LOS: 1 day   Jeoffrey Massed, MD  [facility]  If 7PM-7AM, please contact night-coverage  Please page via [email or web address] [email or web address]-click on MD name and type text message  07/18/2018, 3:49 PM

## 2018-07-18 NOTE — Consult Note (Signed)
ORTHOPAEDIC CONSULTATION  REQUESTING PHYSICIAN: Maretta Bees, MD  Chief Complaint: multiple RLE abscesses  HPI: James Roberson is a 72 y.o. male who presents with RLE cellulitis, swelling, pain, and 4 subcutaneous knots for a couple of days.  He's had a history of recurrent subcutaneous abscesses that was last I&D'ed by Dr. Johna Sheriff in 2012.  He was admitted to medicine yesterday for abx.  He denies any constitutional sxs.  Ortho consulted.  Past Medical History:  Diagnosis Date  . Diabetes mellitus without complication (HCC)   . Hyperlipidemia   . Hypertension   . Obesity    Past Surgical History:  Procedure Laterality Date  . BACK SURGERY    . LEG SURGERY    . LUMBAR LAMINECTOMY  1989   Social History   Socioeconomic History  . Marital status: Married    Spouse name: Not on file  . Number of children: Not on file  . Years of education: Not on file  . Highest education level: Not on file  Occupational History  . Not on file  Social Needs  . Financial resource strain: Not on file  . Food insecurity:    Worry: Not on file    Inability: Not on file  . Transportation needs:    Medical: Not on file    Non-medical: Not on file  Tobacco Use  . Smoking status: Former Smoker    Last attempt to quit: 07/23/1966    Years since quitting: 52.0  . Smokeless tobacco: Never Used  Substance and Sexual Activity  . Alcohol use: Yes    Comment: 1 a quarter  . Drug use: Not on file  . Sexual activity: Not on file  Lifestyle  . Physical activity:    Days per week: Not on file    Minutes per session: Not on file  . Stress: Not on file  Relationships  . Social connections:    Talks on phone: Not on file    Gets together: Not on file    Attends religious service: Not on file    Active member of club or organization: Not on file    Attends meetings of clubs or organizations: Not on file    Relationship status: Not on file  Other Topics Concern  . Not on file    Social History Narrative  . Not on file   Family History  Problem Relation Age of Onset  . CAD Father    - negative except otherwise stated in the family history section Allergies  Allergen Reactions  . Avelox Abc [Moxifloxacin] Anaphylaxis  . Tape Rash    PAPER TAPE ONLY!!  . Zolpidem Tartrate Other (See Comments)    Hallucinations  . Ambien Cr [Zolpidem Tartrate Er] Other (See Comments)     Hallucinations  . Zolpidem Other (See Comments)     Hallucinations - name brand only - tolerates generic   . Other Rash    Nuderm and Coban    Prior to Admission medications   Medication Sig Start Date End Date Taking? Authorizing Provider  atorvastatin (LIPITOR) 40 MG tablet Take 40 mg by mouth at bedtime.    Yes [provider]  buPROPion (WELLBUTRIN XL) 150 MG 24 hr tablet Take 150 mg by mouth daily.  10/21/14  Yes [provider]  celecoxib (CELEBREX) 100 MG capsule Take 100 mg by mouth at bedtime.  05/15/18  Yes [provider]  cyanocobalamin 500 MCG tablet Take 500 mcg by mouth every  other day.    Yes [provider]  gabapentin (NEURONTIN) 300 MG capsule Take 600 mg by mouth at bedtime.   Yes [provider]  Lidocaine 5 % CREA Apply 1 application topically See admin instructions. Apply to affected area 3 times a day as directed 07/11/18  Yes [provider]  lisinopril (PRINIVIL,ZESTRIL) 5 MG tablet Take 5 mg by mouth at bedtime.   Yes [provider]  metFORMIN (GLUCOPHAGE-XR) 500 MG 24 hr tablet Take 1,000 mg by mouth 2 (two) times daily.   Yes [provider]  TRULICITY 1.5 MG/0.5ML SOPN Inject 0.5 mLs into the skin every Friday. 05/05/18  Yes [provider]  Emollient (LUBRIDERM SERIOUSLY SENSITIVE) LOTN Apply 1 application topically 2 (two) times daily. Patient not taking: Reported on 07/17/2018 11/03/14   James Petrin, DO  insulin detemir (LEVEMIR) 100 UNIT/ML injection Inject 0.18 mLs (18 Units  total) into the skin at bedtime. Patient not taking: Reported on 07/17/2018 11/03/14   James Petrin, DO  ondansetron (ZOFRAN ODT) 4 MG disintegrating tablet Take 1 tablet (4 mg total) by mouth every 8 (eight) hours as needed for nausea or vomiting. Patient not taking: Reported on 01/05/2017 12/25/16   Elson Areas, PA-C  oxyCODONE-acetaminophen (PERCOCET/ROXICET) 5-325 MG tablet Take 2 tablets by mouth every 4 (four) hours as needed for severe pain. Patient not taking: Reported on 07/17/2018 12/25/16   Elson Areas, PA-C  saccharomyces boulardii (FLORASTOR) 250 MG capsule Take 1 capsule (250 mg total) by mouth 2 (two) times daily. Patient not taking: Reported on 12/25/2016 11/03/14   James Petrin, DO  tamsulosin (FLOMAX) 0.4 MG CAPS capsule Take 1 capsule (0.4 mg total) by mouth daily. Patient not taking: Reported on 01/05/2017 12/25/16   Elson Areas, PA-C  vancomycin 1,500 mg in sodium chloride 0.9 % 500 mL Inject 1,500 mg into the vein daily. Patient not taking: Reported on 01/05/2017 11/17/14   James Petrin, DO   Dg Tibia/fibula Right  Result Date: 07/17/2018 CLINICAL DATA:  Cellulitis EXAM: RIGHT TIBIA AND FIBULA - 2 VIEW COMPARISON:  11/10/2014 FINDINGS: No skeletal abnormality. No soft tissue mass or gas in the soft tissues. No foreign body. Mild calcification in the medial leg may represent dermal calcification and is unchanged from the prior study. IMPRESSION: No acute bony or soft tissue abnormality right lower leg Electronically Signed   By: James Roberson M.D.   On: 07/17/2018 19:22   Mr Tibia Fibula Right Wo Contrast  Result Date: 07/18/2018 CLINICAL DATA:  Right lower leg pain and swelling over the past 2 weeks which worsened over the past 2 days. The patient is diabetic. EXAM: MRI OF LOWER RIGHT EXTREMITY WITHOUT CONTRAST TECHNIQUE: Multiplanar, multisequence MR imaging of the right lower leg was performed. No intravenous contrast was administered. COMPARISON:  Plain  films right lower leg 07/17/2018. FINDINGS: Bones/Joint/Cartilage Bone marrow signal is normal throughout without fracture, stress change or focal lesion. No evidence of osteomyelitis. Ligaments Negative. Muscles and Tendons Mildly increased T2 signal is seen in the distal aspect of the medial gastrocnemius and underlying medial periphery of the soleus. No intramuscular fluid collection. No tear. There is some fatty atrophy of lower leg musculature bilaterally which appears symmetric. Soft tissues Subcutaneous increased T2 signal in the lower legs bilaterally is much worse on the right. A fluid collection in the posterior, medial soft tissues of the right lower leg measures 0.8 cm transverse x 0.9 cm AP x 1.3 cm craniocaudal. A second small  fluid collection more inferiorly in the subcutaneous tissues measures 0.8 cm craniocaudal by 0.6 cm transverse by 0.6 cm AP. A third tiny fluid collection in the subcutaneous tissues measuring 0.9 cm is identified. Finally, there is a fourth fluid collection in the subcutaneous tissues lateral to the Achilles tendon which measures 0.6 cm in diameter. IMPRESSION: Extensive subcutaneous edema about the right lower leg is compatible with cellulitis. Mild edema in the inferior aspect of the medial gastrocnemius and lateral periphery of the underlying soleus could be due to myositis or related to denervation atrophy. Four subcutaneous fluid collections measuring 1.3 cm or less in diameter in the right lower leg as described above are worrisome for abscesses. Electronically Signed   By: Drusilla Kannerhomas  Dalessio M.D.   On: 07/18/2018 12:34   [place] Rt Lower Extrem Ltd Soft Tissue Non Vascular  Result Date: 07/17/2018 CLINICAL DATA:  Cellulitis EXAM: ULTRASOUND right LOWER EXTREMITY LIMITED TECHNIQUE: Ultrasound examination of the lower extremity soft tissues was performed in the area of clinical concern. COMPARISON:  Tib-fib x-rays from today FINDINGS: Scanning limited to the area of concern  in the right lower leg soft tissues. There is subcutaneous edema. No fluid collection or mass lesion. Negative for abscess. Increased blood flow to the soft tissues on Doppler consistent with cellulitis IMPRESSION: Diffuse soft tissue swelling.  Negative for abscess right lower leg. Electronically Signed   By: James Palauharles  Clark M.D.   On: 07/17/2018 19:21   - pertinent xrays, CT, MRI studies were reviewed and independently interpreted  Positive ROS: All other systems have been reviewed and were otherwise negative with the exception of those mentioned in the HPI and as above.  Physical Exam: General: Alert, no acute distress Cardiovascular: No pedal edema Respiratory: No cyanosis, no use of accessory musculature GI: No organomegaly, abdomen is soft and non-tender Skin: No lesions in the area of chief complaint Neurologic: Sensation intact distally Psychiatric: Patient is competent for consent with normal mood and affect Lymphatic: No axillary or cervical lymphadenopathy  MUSCULOSKELETAL:  - 4 small subcutaneous firm and tender masses on the medial and posterior aspect of the right lower leg - surrounding erythema - no drainage  Assessment: Multiple RLE abscesses vs fluid collections  Plan: - MRI demonstrates 4 small subq abscesses vs fluid collections; they could be amenable to IV abx only as they are quite small - we agreed to see how he responds to IV abx alone and if he deteriorates clinically, then we would perform formal I&D in the OR - patient and wife are both in agreement with plan - will follow his exam for the next few days  Thank you for the consult and the opportunity to see Mr. Kennith Centerllan  N. Glee ArvinMichael Xu, MD [facility] [phone] 5:53 PM

## 2018-07-19 LAB — CBC
HCT: 38.4 % — ABNORMAL LOW (ref 39.0–52.0)
Hemoglobin: 12.1 g/dL — ABNORMAL LOW (ref 13.0–17.0)
MCH: 27.6 pg (ref 26.0–34.0)
MCHC: 31.5 g/dL (ref 30.0–36.0)
MCV: 87.5 fL (ref 80.0–100.0)
NRBC: 0 % (ref 0.0–0.2)
Platelets: 270 10*3/uL (ref 150–400)
RBC: 4.39 MIL/uL (ref 4.22–5.81)
RDW: 12.8 % (ref 11.5–15.5)
WBC: 9 10*3/uL (ref 4.0–10.5)

## 2018-07-19 LAB — BASIC METABOLIC PANEL
Anion gap: 13 (ref 5–15)
BUN: 11 mg/dL (ref 8–23)
CO2: 19 mmol/L — ABNORMAL LOW (ref 22–32)
Calcium: 8.8 mg/dL — ABNORMAL LOW (ref 8.9–10.3)
Chloride: 107 mmol/L (ref 98–111)
Creatinine, Ser: 1.45 mg/dL — ABNORMAL HIGH (ref 0.61–1.24)
GFR calc Af Amer: 55 mL/min — ABNORMAL LOW (ref >60)
GFR calc non Af Amer: 48 mL/min — ABNORMAL LOW (ref >60)
Glucose, Bld: 114 mg/dL — ABNORMAL HIGH (ref 70–99)
Potassium: 3.8 mmol/L (ref 3.5–5.1)
Sodium: 139 mmol/L (ref 135–145)

## 2018-07-19 LAB — GLUCOSE, CAPILLARY
Glucose-Capillary: 123 mg/dL — ABNORMAL HIGH (ref 70–99)
Glucose-Capillary: 184 mg/dL — ABNORMAL HIGH (ref 70–99)
Glucose-Capillary: 135 mg/dL — ABNORMAL HIGH (ref 70–99)
Glucose-Capillary: 131 mg/dL — ABNORMAL HIGH (ref 70–99)

## 2018-07-19 NOTE — Progress Notes (Signed)
PROGRESS NOTE        PATIENT DETAILS Name: James Roberson Age: 72 y.o. Sex: male Date of Birth: 1946-07-14 Admit Date: 07/17/2018 Admitting Physician Eduard Clos, MD ZOX:[id number], No Pcp Per  Brief Narrative: Patient is a 72 y.o. male with history of DM-2, hypertension, dyslipidemia-presenting with erythema, swelling of the right lower extremity.  MRI of the right lower extremity demonstrating small subcutaneous abscesses.  See below for further details  Subjective: Thinks that the pain in his right lower extremity has reduced-small induration in the calf area appears to be essentially the same.  Assessment/Plan: Right lower extremity cellulitis with small abscesses: Afebrile-does not appear acutely sick-continues to have significant pain in the right lower extremity-on IV vancomycin-MRI right leg on 12/11 demonstrates small abscesses-orthopedics recommending IV antibiotics and watchful waiting, if no improvement, orthopedics to perform I&D.  Will await further recommendations from orthopedics.  Hypertension: BP better controlled-continue lisinopril.    DM-2: CBGs stable with SSI-plans to resume oral hypoglycemics on discharge.  Dyslipidemia: Continue statin  CKD stage III: Creatinine close to usual baseline-follow periodically..    Gout: Appears to be stable without any evidence of flare  DVT Prophylaxis: Prophylactic Lovenox  Code Status: Full code  Family Communication: None at bedside  Disposition Plan: Remain inpatient  Antimicrobial agents: Anti-infectives (From admission, onward)   Start     Dose/Rate Route Frequency Ordered Stop   07/18/18 2000  vancomycin (VANCOCIN) 1,500 mg in sodium chloride 0.9 % 500 mL IVPB     1,500 mg 250 mL/hr over 120 Minutes Intravenous Every 24 hours 07/17/18 2348     07/18/18 0000  ceFEPIme (MAXIPIME) 2 g in sodium chloride 0.9 % 100 mL IVPB  Status:  Discontinued     2 g 200 mL/hr over 30  Minutes Intravenous Every 24 hours 07/17/18 2348 07/18/18 1604   07/17/18 2315  ceFEPIme (MAXIPIME) 2 g in sodium chloride 0.9 % 100 mL IVPB     2 g 200 mL/hr over 30 Minutes Intravenous  Once 07/17/18 2313 07/18/18 0038   07/17/18 2000  vancomycin (VANCOCIN) 2,000 mg in sodium chloride 0.9 % 500 mL IVPB     2,000 mg 250 mL/hr over 120 Minutes Intravenous  Once 07/17/18 1840 07/17/18 2223   07/17/18 1900  piperacillin-tazobactam (ZOSYN) IVPB 3.375 g     3.375 g 100 mL/hr over 30 Minutes Intravenous  Once 07/17/18 1840 07/17/18 2129      Procedures: None  CONSULTS:  orthopedic surgery  Time spent: 25- minutes-Greater than 50% of this time was spent in counseling, explanation of diagnosis, planning of further management, and coordination of care.  MEDICATIONS: Scheduled Meds: . atorvastatin  40 mg Oral QHS  . buPROPion  150 mg Oral Daily  . enoxaparin (LOVENOX) injection  40 mg Subcutaneous Q24H  . gabapentin  600 mg Oral QHS  . insulin aspart  0-9 Units Subcutaneous TID WC  . lisinopril  10 mg Oral QHS  . vitamin B-12  500 mcg Oral QODAY   Continuous Infusions: . vancomycin 1,500 mg (07/18/18 1946)   PRN Meds:.acetaminophen **OR** acetaminophen, hydrALAZINE, morphine injection, ondansetron **OR** ondansetron (ZOFRAN) IV, oxyCODONE   PHYSICAL EXAM: Vital signs: Vitals:   07/17/18 2300 07/18/18 0751 07/18/18 1702 07/19/18 0723  BP:  (!) 147/84 (!) 149/77 136/74  Pulse:  92 85 90  Resp:  11  Temp:  97.8 F (36.6 C) 97.7 F (36.5 C) 98 F (36.7 C)  TempSrc:  Oral Oral Oral  SpO2:  99% 97% 96%  Weight: 87.7 kg     Height: 5\' 9"  (1.753 m)      Filed Weights   07/17/18 2300  Weight: 87.7 kg   Body mass index is 28.55 kg/m.   General appearance:Awake, alert, not in any distress.  Eyes:no scleral icterus. HEENT: Atraumatic and Normocephalic Neck: supple, no JVD. Resp:Good air entry bilaterally,no rales or rhonchi CVS: S1 S2 regular, no murmurs.  GI: Bowel  sounds present, Non tender and not distended with no gaurding, rigidity or rebound. Extremities: B/L Lower Ext shows no edema, both legs are warm to touch.  Continues to have some indurated areas in the back of his calf that are tender Neurology:  Non focal Psychiatric: Normal judgment and insight. Normal mood. Musculoskeletal:No digital cyanosis Skin:No Rash, warm and dry Wounds:N/A  I have personally reviewed following labs and imaging studies  LABORATORY DATA: CBC: Recent Labs  Lab 07/17/18 1621 07/18/18 0124 07/19/18 0338  WBC 8.8 9.2 9.0  NEUTROABS 5.9  --   --   HGB 14.0 12.0* 12.1*  HCT 45.8 38.0* 38.4*  MCV 88.4 86.6 87.5  PLT 348 277 270    Basic Metabolic Panel: Recent Labs  Lab 07/17/18 1621 07/18/18 0124 07/19/18 0338  NA 139 141 139  K 4.5 5.0 3.8  CL 104 111 107  CO2 22 22 19*  GLUCOSE 236* 143* 114*  BUN 22 19 11   CREATININE 1.52* 1.34* 1.45*  CALCIUM 9.7 8.8* 8.8*    GFR: Estimated Creatinine Clearance: 50.5 mL/min (A) (by C-G formula based on SCr of 1.45 mg/dL (H)).  Liver Function Tests: Recent Labs  Lab 07/17/18 1621 07/18/18 0124  AST 24 18  ALT 22 21  ALKPHOS 62 48  BILITOT 0.7 0.8  PROT 6.7 5.9*  ALBUMIN 3.8 3.2*   No results for input(s): LIPASE, AMYLASE in the last 168 hours. No results for input(s): AMMONIA in the last 168 hours.  Coagulation Profile: No results for input(s): INR, PROTIME in the last 168 hours.  Cardiac Enzymes: No results for input(s): CKTOTAL, CKMB, CKMBINDEX, TROPONINI in the last 168 hours.  BNP (last 3 results) No results for input(s): PROBNP in the last 8760 hours.  HbA1C: No results for input(s): HGBA1C in the last 72 hours.  CBG: Recent Labs  Lab 07/18/18 1209 07/18/18 1702 07/18/18 2119 07/19/18 0825 07/19/18 1128  GLUCAP 149* 168* 133* 123* 184*    Lipid Profile: No results for input(s): CHOL, HDL, LDLCALC, TRIG, CHOLHDL, LDLDIRECT in the last 72 hours.  Thyroid Function Tests: No  results for input(s): TSH, T4TOTAL, FREET4, T3FREE, THYROIDAB in the last 72 hours.  Anemia Panel: No results for input(s): VITAMINB12, FOLATE, FERRITIN, TIBC, IRON, RETICCTPCT in the last 72 hours.  Urine analysis:    Component Value Date/Time   COLORURINE YELLOW 12/25/2016 1721   APPEARANCEUR CLEAR 12/25/2016 1721   LABSPEC 1.013 12/25/2016 1721   PHURINE 5.0 12/25/2016 1721   GLUCOSEU 50 (A) 12/25/2016 1721   HGBUR MODERATE (A) 12/25/2016 1721   BILIRUBINUR NEGATIVE 12/25/2016 1721   KETONESUR NEGATIVE 12/25/2016 1721   PROTEINUR NEGATIVE 12/25/2016 1721   UROBILINOGEN 0.2 10/30/2014 0004   NITRITE NEGATIVE 12/25/2016 1721   LEUKOCYTESUR NEGATIVE 12/25/2016 1721    Sepsis Labs: Lactic Acid, Venous    Component Value Date/Time   LATICACIDVEN 2.6 (HH) 07/18/2018 0316  MICROBIOLOGY: Recent Results (from the past 240 hour(s))  Blood culture (routine x 2)     Status: None (Preliminary result)   Collection Time: 07/17/18  7:50 PM  Result Value Ref Range Status   Specimen Description BLOOD LEFT ANTECUBITAL  Final   Special Requests   Final    BOTTLES DRAWN AEROBIC AND ANAEROBIC Blood Culture adequate volume   Culture   Final    NO GROWTH 2 DAYS Performed at [facility], [address] [place], [place] [address]    Report Status PENDING  Incomplete  Blood culture (routine x 2)     Status: None (Preliminary result)   Collection Time: 07/17/18  7:57 PM  Result Value Ref Range Status   Specimen Description BLOOD LEFT HAND  Final   Special Requests   Final    BOTTLES DRAWN AEROBIC AND ANAEROBIC Blood Culture results may not be optimal due to an inadequate volume of blood received in culture bottles   Culture   Final    NO GROWTH 2 DAYS Performed at [facility], [address] [place], [place] [address]    Report Status PENDING  Incomplete    RADIOLOGY STUDIES/RESULTS: Dg Tibia/fibula Right  Result Date: 07/17/2018 CLINICAL DATA:  Cellulitis EXAM:  RIGHT TIBIA AND FIBULA - 2 VIEW COMPARISON:  11/10/2014 FINDINGS: No skeletal abnormality. No soft tissue mass or gas in the soft tissues. No foreign body. Mild calcification in the medial leg may represent dermal calcification and is unchanged from the prior study. IMPRESSION: No acute bony or soft tissue abnormality right lower leg Electronically Signed   By: Marlan Palauharles  Clark M.D.   On: 07/17/2018 19:22   Mr Tibia Fibula Right Wo Contrast  Result Date: 07/18/2018 CLINICAL DATA:  Right lower leg pain and swelling over the past 2 weeks which worsened over the past 2 days. The patient is diabetic. EXAM: MRI OF LOWER RIGHT EXTREMITY WITHOUT CONTRAST TECHNIQUE: Multiplanar, multisequence MR imaging of the right lower leg was performed. No intravenous contrast was administered. COMPARISON:  Plain films right lower leg 07/17/2018. FINDINGS: Bones/Joint/Cartilage Bone marrow signal is normal throughout without fracture, stress change or focal lesion. No evidence of osteomyelitis. Ligaments Negative. Muscles and Tendons Mildly increased T2 signal is seen in the distal aspect of the medial gastrocnemius and underlying medial periphery of the soleus. No intramuscular fluid collection. No tear. There is some fatty atrophy of lower leg musculature bilaterally which appears symmetric. Soft tissues Subcutaneous increased T2 signal in the lower legs bilaterally is much worse on the right. A fluid collection in the posterior, medial soft tissues of the right lower leg measures 0.8 cm transverse x 0.9 cm AP x 1.3 cm craniocaudal. A second small fluid collection more inferiorly in the subcutaneous tissues measures 0.8 cm craniocaudal by 0.6 cm transverse by 0.6 cm AP. A third tiny fluid collection in the subcutaneous tissues measuring 0.9 cm is identified. Finally, there is a fourth fluid collection in the subcutaneous tissues lateral to the Achilles tendon which measures 0.6 cm in diameter. IMPRESSION: Extensive subcutaneous  edema about the right lower leg is compatible with cellulitis. Mild edema in the inferior aspect of the medial gastrocnemius and lateral periphery of the underlying soleus could be due to myositis or related to denervation atrophy. Four subcutaneous fluid collections measuring 1.3 cm or less in diameter in the right lower leg as described above are worrisome for abscesses. Electronically Signed   By: Drusilla Kannerhomas  Dalessio M.D.  On: 07/18/2018 12:34   [place] Rt Lower Extrem Ltd Soft Tissue Non Vascular  Result Date: 07/17/2018 CLINICAL DATA:  Cellulitis EXAM: ULTRASOUND right LOWER EXTREMITY LIMITED TECHNIQUE: Ultrasound examination of the lower extremity soft tissues was performed in the area of clinical concern. COMPARISON:  Tib-fib x-rays from today FINDINGS: Scanning limited to the area of concern in the right lower leg soft tissues. There is subcutaneous edema. No fluid collection or mass lesion. Negative for abscess. Increased blood flow to the soft tissues on Doppler consistent with cellulitis IMPRESSION: Diffuse soft tissue swelling.  Negative for abscess right lower leg. Electronically Signed   By: Marlan Palau M.D.   On: 07/17/2018 19:21     LOS: 2 days   Jeoffrey Massed, MD  [facility]  If 7PM-7AM, please contact night-coverage  Please page via [email or web address] [email or web address]-click on MD name and type text message  07/19/2018, 2:53 PM

## 2018-07-19 NOTE — Progress Notes (Signed)
Pt c/o excruciating pain in the RLE from cellulitis. RN gave morphine 1mg . After assessment, the blister- like bumps on pt's leg are increasing in size. Pt requested the MD be called. RN called Dr Roda ShuttersXu and left a message. Will continue to monitor.  Blane OharaSavannah Jes Costales RN

## 2018-07-20 ENCOUNTER — Encounter (HOSPITAL_COMMUNITY)
Admission: EM | Disposition: A | Discharge status: Home Health | Payer: Self-pay | Source: Home / Self Care | Attending: Internal Medicine

## 2018-07-20 ENCOUNTER — Encounter (HOSPITAL_COMMUNITY): Payer: Self-pay | Admitting: Surgery

## 2018-07-20 ENCOUNTER — Inpatient Hospital Stay (HOSPITAL_COMMUNITY): Payer: Medicare Other | Admitting: Anesthesiology

## 2018-07-20 HISTORY — PX: I & D EXTREMITY: SHX5045

## 2018-07-20 HISTORY — PX: I&D EXTREMITY: SHX5045

## 2018-07-20 LAB — SURGICAL PCR SCREEN
MRSA, PCR: NEGATIVE
Staphylococcus aureus: POSITIVE — AB

## 2018-07-20 LAB — GLUCOSE, CAPILLARY
Glucose-Capillary: 152 mg/dL — ABNORMAL HIGH (ref 70–99)
Glucose-Capillary: 138 mg/dL — ABNORMAL HIGH (ref 70–99)
Glucose-Capillary: 123 mg/dL — ABNORMAL HIGH (ref 70–99)
Glucose-Capillary: 153 mg/dL — ABNORMAL HIGH (ref 70–99)
Glucose-Capillary: 163 mg/dL — ABNORMAL HIGH (ref 70–99)

## 2018-07-20 LAB — CBC
HCT: 37.5 % — ABNORMAL LOW (ref 39.0–52.0)
Hemoglobin: 11.6 g/dL — ABNORMAL LOW (ref 13.0–17.0)
MCH: 26.7 pg (ref 26.0–34.0)
MCHC: 30.9 g/dL (ref 30.0–36.0)
MCV: 86.4 fL (ref 80.0–100.0)
NRBC: 0 % (ref 0.0–0.2)
Platelets: 294 10*3/uL (ref 150–400)
RBC: 4.34 MIL/uL (ref 4.22–5.81)
RDW: 12.8 % (ref 11.5–15.5)
WBC: 9.8 10*3/uL (ref 4.0–10.5)

## 2018-07-20 LAB — BASIC METABOLIC PANEL
Anion gap: 11 (ref 5–15)
BUN: 9 mg/dL (ref 8–23)
CO2: 23 mmol/L (ref 22–32)
Calcium: 9 mg/dL (ref 8.9–10.3)
Chloride: 104 mmol/L (ref 98–111)
Creatinine, Ser: 1.57 mg/dL — ABNORMAL HIGH (ref 0.61–1.24)
GFR calc Af Amer: 50 mL/min — ABNORMAL LOW (ref >60)
GFR calc non Af Amer: 43 mL/min — ABNORMAL LOW (ref >60)
Glucose, Bld: 211 mg/dL — ABNORMAL HIGH (ref 70–99)
Potassium: 4 mmol/L (ref 3.5–5.1)
Sodium: 138 mmol/L (ref 135–145)

## 2018-07-20 SURGERY — IRRIGATION AND DEBRIDEMENT EXTREMITY
Anesthesia: General | Site: Leg Lower | Laterality: Right

## 2018-07-20 MED ORDER — DIPHENHYDRAMINE HCL 12.5 MG/5ML PO ELIX: 25.0000 mg | ORAL_SOLUTION | ORAL | Status: DC | PRN

## 2018-07-20 MED ORDER — SODIUM CHLORIDE 0.9 % IR SOLN
Status: DC | PRN
  Administered 2018-07-20 (×3): 3000 mL

## 2018-07-20 MED ORDER — FENTANYL CITRATE (PF) 250 MCG/5ML IJ SOLN
INTRAMUSCULAR | Status: AC
  Filled 2018-07-20: qty 5

## 2018-07-20 MED ORDER — ACETAMINOPHEN 500 MG PO TABS
500.0000 mg | ORAL_TABLET | Freq: Four times a day (QID) | ORAL | Status: AC
  Administered 2018-07-20 – 2018-07-21 (×3): 500 mg via ORAL
  Filled 2018-07-20 (×3): qty 1

## 2018-07-20 MED ORDER — METHOCARBAMOL 500 MG PO TABS
ORAL_TABLET | ORAL | Status: AC
  Filled 2018-07-20: qty 1

## 2018-07-20 MED ORDER — METHOCARBAMOL 500 MG PO TABS
500.0000 mg | ORAL_TABLET | Freq: Four times a day (QID) | ORAL | Status: DC | PRN
  Administered 2018-07-20 – 2018-07-21 (×2): 500 mg via ORAL
  Filled 2018-07-20: qty 1

## 2018-07-20 MED ORDER — HYDROCODONE-ACETAMINOPHEN 5-325 MG PO TABS
1.0000 | ORAL_TABLET | ORAL | Status: DC | PRN
  Administered 2018-07-20: 2 via ORAL
  Administered 2018-07-20 (×2): 1 via ORAL
  Administered 2018-07-21 – 2018-07-24 (×3): 2 via ORAL
  Filled 2018-07-20 (×5): qty 2

## 2018-07-20 MED ORDER — LIDOCAINE 2% (20 MG/ML) 5 ML SYRINGE
INTRAMUSCULAR | Status: DC | PRN
  Administered 2018-07-20: 60 mg via INTRAVENOUS

## 2018-07-20 MED ORDER — POLYETHYLENE GLYCOL 3350 17 G PO PACK
17.0000 g | PACK | Freq: Every day | ORAL | Status: DC | PRN
  Administered 2018-07-21: 17 g via ORAL
  Filled 2018-07-20: qty 1

## 2018-07-20 MED ORDER — LABETALOL HCL 5 MG/ML IV SOLN
INTRAVENOUS | Status: AC
  Filled 2018-07-20: qty 4

## 2018-07-20 MED ORDER — ONDANSETRON HCL 4 MG/2ML IJ SOLN
INTRAMUSCULAR | Status: DC | PRN
  Administered 2018-07-20: 4 mg via INTRAVENOUS

## 2018-07-20 MED ORDER — FENTANYL CITRATE (PF) 100 MCG/2ML IJ SOLN
INTRAMUSCULAR | Status: AC
  Filled 2018-07-20: qty 2

## 2018-07-20 MED ORDER — CEFAZOLIN SODIUM 1 G IJ SOLR
INTRAMUSCULAR | Status: AC
  Filled 2018-07-20: qty 20

## 2018-07-20 MED ORDER — LABETALOL HCL 5 MG/ML IV SOLN
5.0000 mg | Freq: Once | INTRAVENOUS | Status: AC
  Administered 2018-07-20: 5 mg via INTRAVENOUS

## 2018-07-20 MED ORDER — PROPOFOL 10 MG/ML IV BOLUS
INTRAVENOUS | Status: DC | PRN
  Administered 2018-07-20: 130 mg via INTRAVENOUS

## 2018-07-20 MED ORDER — PROPOFOL 10 MG/ML IV BOLUS
INTRAVENOUS | Status: AC
  Filled 2018-07-20: qty 20

## 2018-07-20 MED ORDER — FENTANYL CITRATE (PF) 100 MCG/2ML IJ SOLN
25.0000 ug | INTRAMUSCULAR | Status: DC | PRN
  Administered 2018-07-20 (×2): 50 ug via INTRAVENOUS

## 2018-07-20 MED ORDER — NALOXONE HCL 0.4 MG/ML IJ SOLN
INTRAMUSCULAR | Status: AC
  Filled 2018-07-20: qty 1

## 2018-07-20 MED ORDER — LACTATED RINGERS IV SOLN
INTRAVENOUS | Status: DC
  Administered 2018-07-20: 13:00:00 via INTRAVENOUS

## 2018-07-20 MED ORDER — ONDANSETRON HCL 4 MG/2ML IJ SOLN: 4.0000 mg | Freq: Once | INTRAMUSCULAR | Status: DC | PRN

## 2018-07-20 MED ORDER — METOCLOPRAMIDE HCL 5 MG/ML IJ SOLN: 5.0000 mg | Freq: Three times a day (TID) | INTRAMUSCULAR | Status: DC | PRN

## 2018-07-20 MED ORDER — CEFAZOLIN SODIUM-DEXTROSE 1-4 GM/50ML-% IV SOLN
INTRAVENOUS | Status: DC | PRN
  Administered 2018-07-20: 2 g via INTRAVENOUS

## 2018-07-20 MED ORDER — SODIUM CHLORIDE 0.9 % IV SOLN
INTRAVENOUS | Status: DC
  Administered 2018-07-20: 18:00:00 via INTRAVENOUS

## 2018-07-20 MED ORDER — METHOCARBAMOL 1000 MG/10ML IJ SOLN
500.0000 mg | Freq: Four times a day (QID) | INTRAVENOUS | Status: DC | PRN
  Filled 2018-07-20: qty 5

## 2018-07-20 MED ORDER — FENTANYL CITRATE (PF) 100 MCG/2ML IJ SOLN
INTRAMUSCULAR | Status: DC | PRN
  Administered 2018-07-20 (×3): 50 ug via INTRAVENOUS

## 2018-07-20 MED ORDER — SORBITOL 70 % SOLN
30.0000 mL | Freq: Every day | Status: DC | PRN
  Filled 2018-07-20: qty 30

## 2018-07-20 MED ORDER — HYDROCODONE-ACETAMINOPHEN 7.5-325 MG PO TABS
ORAL_TABLET | ORAL | Status: AC
  Filled 2018-07-20: qty 2

## 2018-07-20 MED ORDER — ONDANSETRON HCL 4 MG/2ML IJ SOLN
INTRAMUSCULAR | Status: AC
  Filled 2018-07-20: qty 4

## 2018-07-20 MED ORDER — METOCLOPRAMIDE HCL 5 MG PO TABS: 5.0000 mg | ORAL_TABLET | Freq: Three times a day (TID) | ORAL | Status: DC | PRN

## 2018-07-20 MED ORDER — LACTATED RINGERS IV SOLN
INTRAVENOUS | Status: DC | PRN
  Administered 2018-07-20: 14:00:00 via INTRAVENOUS

## 2018-07-20 MED ORDER — ONDANSETRON HCL 4 MG PO TABS: 4.0000 mg | ORAL_TABLET | Freq: Four times a day (QID) | ORAL | Status: DC | PRN

## 2018-07-20 MED ORDER — MAGNESIUM CITRATE PO SOLN: 1.0000 | Freq: Once | ORAL | Status: DC | PRN

## 2018-07-20 MED ORDER — KETOROLAC TROMETHAMINE 15 MG/ML IJ SOLN
15.0000 mg | Freq: Four times a day (QID) | INTRAMUSCULAR | Status: AC
  Administered 2018-07-20 – 2018-07-21 (×4): 15 mg via INTRAVENOUS
  Filled 2018-07-20 (×4): qty 1

## 2018-07-20 MED ORDER — DEXAMETHASONE SODIUM PHOSPHATE 10 MG/ML IJ SOLN
INTRAMUSCULAR | Status: AC
  Filled 2018-07-20: qty 2

## 2018-07-20 MED ORDER — MORPHINE SULFATE (PF) 2 MG/ML IV SOLN
0.5000 mg | INTRAVENOUS | Status: DC | PRN
  Administered 2018-07-21 – 2018-07-24 (×4): 1 mg via INTRAVENOUS
  Filled 2018-07-20 (×5): qty 1

## 2018-07-20 MED ORDER — LIDOCAINE 2% (20 MG/ML) 5 ML SYRINGE
INTRAMUSCULAR | Status: AC
  Filled 2018-07-20: qty 10

## 2018-07-20 MED ORDER — MUPIROCIN 2 % EX OINT
1.0000 "application " | TOPICAL_OINTMENT | Freq: Two times a day (BID) | CUTANEOUS | Status: DC
  Administered 2018-07-20 – 2018-07-24 (×9): 1 via NASAL
  Filled 2018-07-20 (×2): qty 22

## 2018-07-20 MED ORDER — HYDROCODONE-ACETAMINOPHEN 7.5-325 MG PO TABS
1.0000 | ORAL_TABLET | ORAL | Status: DC | PRN
  Administered 2018-07-20 – 2018-07-23 (×5): 2 via ORAL
  Filled 2018-07-20 (×4): qty 2

## 2018-07-20 MED ORDER — ACETAMINOPHEN 325 MG PO TABS: 325.0000 mg | ORAL_TABLET | Freq: Four times a day (QID) | ORAL | Status: DC | PRN

## 2018-07-20 MED ORDER — DOCUSATE SODIUM 100 MG PO CAPS
100.0000 mg | ORAL_CAPSULE | Freq: Two times a day (BID) | ORAL | Status: DC
  Administered 2018-07-20 – 2018-07-23 (×4): 100 mg via ORAL
  Filled 2018-07-20 (×5): qty 1

## 2018-07-20 MED ORDER — ONDANSETRON HCL 4 MG/2ML IJ SOLN: 4.0000 mg | Freq: Four times a day (QID) | INTRAMUSCULAR | Status: DC | PRN

## 2018-07-20 SURGICAL SUPPLY — 50 items
BANDAGE ACE 3X5.8 VEL STRL LF (GAUZE/BANDAGES/DRESSINGS) IMPLANT
BNDG COHESIVE 1X5 TAN STRL LF (GAUZE/BANDAGES/DRESSINGS) IMPLANT
BNDG COHESIVE 4X5 TAN STRL (GAUZE/BANDAGES/DRESSINGS) ×1 IMPLANT
BNDG COHESIVE 6X5 TAN STRL LF (GAUZE/BANDAGES/DRESSINGS) ×2 IMPLANT
BNDG CONFORM 3 STRL LF (GAUZE/BANDAGES/DRESSINGS) IMPLANT
BNDG GAUZE STRTCH 6 (GAUZE/BANDAGES/DRESSINGS) ×2 IMPLANT
CORDS BIPOLAR (ELECTRODE) IMPLANT
COVER SURGICAL LIGHT HANDLE (MISCELLANEOUS) ×2 IMPLANT
COVER WAND RF STERILE (DRAPES) ×2 IMPLANT
CUFF TOURNIQUET SINGLE 24IN (TOURNIQUET CUFF) IMPLANT
CUFF TOURNIQUET SINGLE 34IN LL (TOURNIQUET CUFF) IMPLANT
CUFF TOURNIQUET SINGLE 44IN (TOURNIQUET CUFF) IMPLANT
DRAPE INCISE IOBAN 66X45 STRL (DRAPES) IMPLANT
DRAPE U-SHAPE 47X51 STRL (DRAPES) ×2 IMPLANT
DURAPREP 26ML APPLICATOR (WOUND CARE) ×2 IMPLANT
ELECT REM PT RETURN 9FT ADLT (ELECTROSURGICAL)
ELECTRODE REM PT RTRN 9FT ADLT (ELECTROSURGICAL) IMPLANT
GAUZE SPONGE 4X4 12PLY STRL (GAUZE/BANDAGES/DRESSINGS) ×4 IMPLANT
GAUZE XEROFORM 5X9 LF (GAUZE/BANDAGES/DRESSINGS) ×2 IMPLANT
GLOVE BIOGEL PI IND STRL 7.0 (GLOVE) ×1 IMPLANT
GLOVE BIOGEL PI INDICATOR 7.0 (GLOVE) ×1
GLOVE ECLIPSE 7.0 STRL STRAW (GLOVE) ×2 IMPLANT
GLOVE SKINSENSE NS SZ7.5 (GLOVE) ×2
GLOVE SKINSENSE STRL SZ7.5 (GLOVE) ×2 IMPLANT
GOWN STRL REIN XL XLG (GOWN DISPOSABLE) ×4 IMPLANT
HANDPIECE INTERPULSE COAX TIP (DISPOSABLE)
KIT BASIN OR (CUSTOM PROCEDURE TRAY) ×2 IMPLANT
KIT TURNOVER KIT B (KITS) ×2 IMPLANT
MANIFOLD NEPTUNE II (INSTRUMENTS) ×2 IMPLANT
PACK ORTHO EXTREMITY (CUSTOM PROCEDURE TRAY) ×2 IMPLANT
PAD ABD 8X10 STRL (GAUZE/BANDAGES/DRESSINGS) ×2 IMPLANT
PAD ARMBOARD 7.5X6 YLW CONV (MISCELLANEOUS) ×4 IMPLANT
PADDING CAST ABS 4INX4YD NS (CAST SUPPLIES) ×1
PADDING CAST ABS COTTON 4X4 ST (CAST SUPPLIES) ×1 IMPLANT
PADDING CAST COTTON 6X4 STRL (CAST SUPPLIES) ×2 IMPLANT
SET HNDPC FAN SPRY TIP SCT (DISPOSABLE) IMPLANT
SPONGE LAP 18X18 X RAY DECT (DISPOSABLE) ×2 IMPLANT
STOCKINETTE IMPERVIOUS 9X36 MD (GAUZE/BANDAGES/DRESSINGS) ×2 IMPLANT
SUT ETHILON 2 0 FS 18 (SUTURE) ×6 IMPLANT
SUT ETHILON 2 0 PSLX (SUTURE) ×2 IMPLANT
SUT VIC AB 2-0 FS1 27 (SUTURE) ×4 IMPLANT
SWAB CULTURE ESWAB REG 1ML (MISCELLANEOUS) IMPLANT
TOWEL OR 17X24 6PK STRL BLUE (TOWEL DISPOSABLE) ×2 IMPLANT
TOWEL OR 17X26 10 PK STRL BLUE (TOWEL DISPOSABLE) ×2 IMPLANT
TUBE CONNECTING 12X1/4 (SUCTIONS) ×2 IMPLANT
TUBE FEEDING ENTERAL 5FR 16IN (TUBING) IMPLANT
TUBING CYSTO DISP (UROLOGICAL SUPPLIES) ×2 IMPLANT
UNDERPAD 30X30 (UNDERPADS AND DIAPERS) ×4 IMPLANT
WATER STERILE IRR 1000ML POUR (IV SOLUTION) ×2 IMPLANT
YANKAUER SUCT BULB TIP NO VENT (SUCTIONS) ×2 IMPLANT

## 2018-07-20 NOTE — Op Note (Signed)
   Date of Surgery: 07/20/2018  INDICATIONS: Mr. James Roberson is a 72 y.o.-year-old male with multiple right lower leg abscesses;  The patient did consent to the procedure after discussion of the risks and benefits.  PREOPERATIVE DIAGNOSIS: Five subcutaneous right lower leg abscesses  POSTOPERATIVE DIAGNOSIS: Same.  PROCEDURE: Incision and drainage of 5 separate subcutaneous abscesses from right lower leg  SURGEON: N. Glee ArvinMichael Xu, M.D.  ASSIST: Starlyn SkeansMary Lindsey [place], [place]; necessary for the timely completion of procedure and due to complexity of procedure.  ANESTHESIA:  general  IV FLUIDS AND URINE: See anesthesia.  ESTIMATED BLOOD LOSS: minimal mL.  IMPLANTS: none  COMPLICATIONS: None.  DESCRIPTION OF PROCEDURE: The patient was brought to the operating room and placed supine on the operating table.  The patient had been signed prior to the procedure and this was documented. The patient had the anesthesia placed by the anesthesiologist.  A time-out was performed to confirm that this was the correct patient, site, side and location. The patient did receive antibiotics prior to the incision and was re-dosed during the procedure as needed at indicated intervals. The patient had the operative extremity prepped and draped in the standard surgical fashion.    After identification of the subcutaneous abscesses by palpation and correlation with MRI I made 5 separate incisions directly over the abscesses.  There was return of frank pus from each 1 of them.  This was cultured.  I then performed sharp excisional debridement with a rondure.  I then used blunt dissection to evaluate for any deeper penetration then the subcutaneous tissue and for any tunneling.  Fortunately I did not appreciate any.  A total of 9 L of normal saline was irrigated through all the abscesses.  These were then left open and packed with wet-to-dry dressings.  Sterile dressings were applied.  Patient tolerated procedure well had no  immediate complications.  POSTOPERATIVE PLAN: Patient will undergo wet-to-dry dressings twice a day until the wounds closed by secondary intention.  Mayra ReelN. Michael Xu, MD [facility] [phone] 2:59 PM

## 2018-07-20 NOTE — Care Management Important Message (Signed)
Important Message  Patient Details  Name: Wynne DustJonathan S Buchheit MRN: [id number] Date of Birth: 08/26/1945   Medicare Important Message Given:  Yes    Dorena BodoIris Takako Minckler 07/20/2018, 4:09 PM

## 2018-07-20 NOTE — Progress Notes (Addendum)
PROGRESS NOTE        PATIENT DETAILS Name: James Roberson Age: 72 y.o. Sex: male Date of Birth: 1945/12/03 Admit Date: 07/17/2018 Admitting Physician Eduard Clos, MD ZOX:[id number], No Pcp Per  Brief Narrative: Patient is a 72 y.o. male with history of DM-2, hypertension, dyslipidemia-presenting with erythema, swelling of the right lower extremity.  MRI of the right lower extremity demonstrating small subcutaneous abscesses.  See below for further details  Subjective: Denies to have significant pain-has developed 2-3 blisters in the posterior aspect of his right calf.  He is afebrile overnight.  Assessment/Plan: Right lower extremity cellulitis with small abscesses: Worsening pain overnight-has developed small blisters/pus pockets in the posterior aspect of his calf-has required multiple dosage of IV narcotics and oral narcotics in the past 24 hours.  Since appears to be worsening in spite of IV antibiotics-clearly need a I&D-orthopedics pending on I&D later today.  Hypertension: BP better controlled-controlled-continue with lisinopril-watch creatinine which is slowly trending up.  DM-2: CBGs stable with SSI-plans to resume oral hypoglycemics on discharge.  Dyslipidemia: Continue statin  CKD stage III: Creatinine slowly creeping up but still close to usual baseline-should improved post I&D.  Follow electrolytes.    Gout: Appears to be stable-no evidence of flare.  DVT Prophylaxis: Prophylactic Lovenox  Code Status: Full code  Family Communication: None at bedside  Disposition Plan: Remain inpatient  Antimicrobial agents: Anti-infectives (From admission, onward)   Start     Dose/Rate Route Frequency Ordered Stop   07/18/18 2000  vancomycin (VANCOCIN) 1,500 mg in sodium chloride 0.9 % 500 mL IVPB     1,500 mg 250 mL/hr over 120 Minutes Intravenous Every 24 hours 07/17/18 2348     07/18/18 0000  ceFEPIme (MAXIPIME) 2 g in sodium chloride  0.9 % 100 mL IVPB  Status:  Discontinued     2 g 200 mL/hr over 30 Minutes Intravenous Every 24 hours 07/17/18 2348 07/18/18 1604   07/17/18 2315  ceFEPIme (MAXIPIME) 2 g in sodium chloride 0.9 % 100 mL IVPB     2 g 200 mL/hr over 30 Minutes Intravenous  Once 07/17/18 2313 07/18/18 0038   07/17/18 2000  vancomycin (VANCOCIN) 2,000 mg in sodium chloride 0.9 % 500 mL IVPB     2,000 mg 250 mL/hr over 120 Minutes Intravenous  Once 07/17/18 1840 07/17/18 2223   07/17/18 1900  piperacillin-tazobactam (ZOSYN) IVPB 3.375 g     3.375 g 100 mL/hr over 30 Minutes Intravenous  Once 07/17/18 1840 07/17/18 2129      Procedures: None  CONSULTS:  orthopedic surgery  Time spent: 35- minutes-Greater than 50% of this time was spent in counseling, explanation of diagnosis, planning of further management, and coordination of care.  MEDICATIONS: Scheduled Meds: . atorvastatin  40 mg Oral QHS  . buPROPion  150 mg Oral Daily  . enoxaparin (LOVENOX) injection  40 mg Subcutaneous Q24H  . gabapentin  600 mg Oral QHS  . insulin aspart  0-9 Units Subcutaneous TID WC  . lisinopril  10 mg Oral QHS  . mupirocin ointment  1 application Nasal BID  . vitamin B-12  500 mcg Oral QODAY   Continuous Infusions: . vancomycin Stopped (07/19/18 2224)   PRN Meds:.acetaminophen **OR** acetaminophen, hydrALAZINE, morphine injection, ondansetron **OR** ondansetron (ZOFRAN) IV, oxyCODONE   PHYSICAL EXAM: Vital signs: Vitals:   07/19/18 0723 07/19/18  1608 07/19/18 2333 07/20/18 0802  BP: 136/74 (!) 146/81 (!) 177/89 (!) 146/91  Pulse: 90 89 93 84  Resp: 11 12 20    Temp: 98 F (36.7 C) 98.4 F (36.9 C) 98.2 F (36.8 C) 97.7 F (36.5 C)  TempSrc: Oral Oral Oral Oral  SpO2: 96% 98% 95% 98%  Weight:      Height:       Filed Weights   07/17/18 2300  Weight: 87.7 kg   Body mass index is 28.55 kg/m.   General appearance:Awake, alert, not in any distress.  Eyes:no scleral icterus. HEENT: Atraumatic and  Normocephalic Neck: supple, no JVD. Resp:Good air entry bilaterally,no rales or rhonchi CVS: S1 S2 regular, no murmurs.  GI: Bowel sounds present, Non tender and not distended with no gaurding, rigidity or rebound. Extremities: B/L Lower Ext shows no edema, both legs are warm to touch.  2-3 small blisters-pus pockets in the posterior aspect of the right calf.  Very tender in the posterior aspect of the right calf towards the ankle. Neurology:  Non focal Psychiatric: Normal judgment and insight. Normal mood. Musculoskeletal:No digital cyanosis Skin:No Rash, warm and dry Wounds:N/A  I have personally reviewed following labs and imaging studies  LABORATORY DATA: CBC: Recent Labs  Lab 07/17/18 1621 07/18/18 0124 07/19/18 0338 07/20/18 0249  WBC 8.8 9.2 9.0 9.8  NEUTROABS 5.9  --   --   --   HGB 14.0 12.0* 12.1* 11.6*  HCT 45.8 38.0* 38.4* 37.5*  MCV 88.4 86.6 87.5 86.4  PLT 348 277 270 294    Basic Metabolic Panel: Recent Labs  Lab 07/17/18 1621 07/18/18 0124 07/19/18 0338 07/20/18 0249  NA 139 141 139 138  K 4.5 5.0 3.8 4.0  CL 104 111 107 104  CO2 22 22 19* 23  GLUCOSE 236* 143* 114* 211*  BUN 22 19 11 9   CREATININE 1.52* 1.34* 1.45* 1.57*  CALCIUM 9.7 8.8* 8.8* 9.0    GFR: Estimated Creatinine Clearance: 46.6 mL/min (A) (by C-G formula based on SCr of 1.57 mg/dL (H)).  Liver Function Tests: Recent Labs  Lab 07/17/18 1621 07/18/18 0124  AST 24 18  ALT 22 21  ALKPHOS 62 48  BILITOT 0.7 0.8  PROT 6.7 5.9*  ALBUMIN 3.8 3.2*   No results for input(s): LIPASE, AMYLASE in the last 168 hours. No results for input(s): AMMONIA in the last 168 hours.  Coagulation Profile: No results for input(s): INR, PROTIME in the last 168 hours.  Cardiac Enzymes: No results for input(s): CKTOTAL, CKMB, CKMBINDEX, TROPONINI in the last 168 hours.  BNP (last 3 results) No results for input(s): PROBNP in the last 8760 hours.  HbA1C: No results for input(s): HGBA1C in the  last 72 hours.  CBG: Recent Labs  Lab 07/19/18 0825 07/19/18 1128 07/19/18 1654 07/19/18 2146 07/20/18 0804  GLUCAP 123* 184* 135* 131* 152*    Lipid Profile: No results for input(s): CHOL, HDL, LDLCALC, TRIG, CHOLHDL, LDLDIRECT in the last 72 hours.  Thyroid Function Tests: No results for input(s): TSH, T4TOTAL, FREET4, T3FREE, THYROIDAB in the last 72 hours.  Anemia Panel: No results for input(s): VITAMINB12, FOLATE, FERRITIN, TIBC, IRON, RETICCTPCT in the last 72 hours.  Urine analysis:    Component Value Date/Time   COLORURINE YELLOW 12/25/2016 1721   APPEARANCEUR CLEAR 12/25/2016 1721   LABSPEC 1.013 12/25/2016 1721   PHURINE 5.0 12/25/2016 1721   GLUCOSEU 50 (A) 12/25/2016 1721   HGBUR MODERATE (A) 12/25/2016 1721   BILIRUBINUR NEGATIVE  12/25/2016 1721   KETONESUR NEGATIVE 12/25/2016 1721   PROTEINUR NEGATIVE 12/25/2016 1721   UROBILINOGEN 0.2 10/30/2014 0004   NITRITE NEGATIVE 12/25/2016 1721   LEUKOCYTESUR NEGATIVE 12/25/2016 1721    Sepsis Labs: Lactic Acid, Venous    Component Value Date/Time   LATICACIDVEN 2.6 (HH) 07/18/2018 0316    MICROBIOLOGY: Recent Results (from the past 240 hour(s))  Blood culture (routine x 2)     Status: None (Preliminary result)   Collection Time: 07/17/18  7:50 PM  Result Value Ref Range Status   Specimen Description BLOOD LEFT ANTECUBITAL  Final   Special Requests   Final    BOTTLES DRAWN AEROBIC AND ANAEROBIC Blood Culture adequate volume   Culture   Final    NO GROWTH 3 DAYS Performed at [facility], [address] [place], [place] [address]    Report Status PENDING  Incomplete  Blood culture (routine x 2)     Status: None (Preliminary result)   Collection Time: 07/17/18  7:57 PM  Result Value Ref Range Status   Specimen Description BLOOD LEFT HAND  Final   Special Requests   Final    BOTTLES DRAWN AEROBIC AND ANAEROBIC Blood Culture results may not be optimal due to an inadequate volume of blood  received in culture bottles   Culture   Final    NO GROWTH 3 DAYS Performed at [facility], [address] [place], [place] [address]    Report Status PENDING  Incomplete  Surgical pcr screen     Status: Abnormal   Collection Time: 07/20/18 12:28 AM  Result Value Ref Range Status   MRSA, PCR NEGATIVE NEGATIVE Final   Staphylococcus aureus POSITIVE (A) NEGATIVE Final    Comment: (NOTE) The Xpert SA Assay (FDA approved for NASAL specimens in patients 72 years of age and older), is one component of a comprehensive surveillance program. It is not intended to diagnose infection nor to guide or monitor treatment. Performed at [facility], [address] [place], [place] [address]     RADIOLOGY STUDIES/RESULTS: Dg Tibia/fibula Right  Result Date: 07/17/2018 CLINICAL DATA:  Cellulitis EXAM: RIGHT TIBIA AND FIBULA - 2 VIEW COMPARISON:  11/10/2014 FINDINGS: No skeletal abnormality. No soft tissue mass or gas in the soft tissues. No foreign body. Mild calcification in the medial leg may represent dermal calcification and is unchanged from the prior study. IMPRESSION: No acute bony or soft tissue abnormality right lower leg Electronically Signed   By: Marlan Palauharles  Clark M.D.   On: 07/17/2018 19:22   Mr Tibia Fibula Right Wo Contrast  Result Date: 07/18/2018 CLINICAL DATA:  Right lower leg pain and swelling over the past 2 weeks which worsened over the past 2 days. The patient is diabetic. EXAM: MRI OF LOWER RIGHT EXTREMITY WITHOUT CONTRAST TECHNIQUE: Multiplanar, multisequence MR imaging of the right lower leg was performed. No intravenous contrast was administered. COMPARISON:  Plain films right lower leg 07/17/2018. FINDINGS: Bones/Joint/Cartilage Bone marrow signal is normal throughout without fracture, stress change or focal lesion. No evidence of osteomyelitis. Ligaments Negative. Muscles and Tendons Mildly increased T2 signal is seen in the distal aspect of the medial  gastrocnemius and underlying medial periphery of the soleus. No intramuscular fluid collection. No tear. There is some fatty atrophy of lower leg musculature bilaterally which appears symmetric. Soft tissues Subcutaneous increased T2 signal in the lower legs bilaterally is much worse on the right. A fluid collection in the posterior, medial soft  tissues of the right lower leg measures 0.8 cm transverse x 0.9 cm AP x 1.3 cm craniocaudal. A second small fluid collection more inferiorly in the subcutaneous tissues measures 0.8 cm craniocaudal by 0.6 cm transverse by 0.6 cm AP. A third tiny fluid collection in the subcutaneous tissues measuring 0.9 cm is identified. Finally, there is a fourth fluid collection in the subcutaneous tissues lateral to the Achilles tendon which measures 0.6 cm in diameter. IMPRESSION: Extensive subcutaneous edema about the right lower leg is compatible with cellulitis. Mild edema in the inferior aspect of the medial gastrocnemius and lateral periphery of the underlying soleus could be due to myositis or related to denervation atrophy. Four subcutaneous fluid collections measuring 1.3 cm or less in diameter in the right lower leg as described above are worrisome for abscesses. Electronically Signed   By: Drusilla Kanner M.D.   On: 07/18/2018 12:34   [place] Rt Lower Extrem Ltd Soft Tissue Non Vascular  Result Date: 07/17/2018 CLINICAL DATA:  Cellulitis EXAM: ULTRASOUND right LOWER EXTREMITY LIMITED TECHNIQUE: Ultrasound examination of the lower extremity soft tissues was performed in the area of clinical concern. COMPARISON:  Tib-fib x-rays from today FINDINGS: Scanning limited to the area of concern in the right lower leg soft tissues. There is subcutaneous edema. No fluid collection or mass lesion. Negative for abscess. Increased blood flow to the soft tissues on Doppler consistent with cellulitis IMPRESSION: Diffuse soft tissue swelling.  Negative for abscess right lower leg.  Electronically Signed   By: Marlan Palau M.D.   On: 07/17/2018 19:21     LOS: 3 days   Jeoffrey Massed, MD  [facility]  If 7PM-7AM, please contact night-coverage  Please page via [email or web address]-click on MD name and type text message  07/20/2018, 10:10 AM

## 2018-07-20 NOTE — Transfer of Care (Signed)
Immediate Anesthesia Transfer of Care Note  Patient: James DustJonathan S Ruffalo  Procedure(s) Performed: IRRIGATION AND DEBRIDEMENT  OF LEG (Right Leg Lower)  Patient Location: PACU  Anesthesia Type:General  Level of Consciousness: awake, alert  and oriented  Airway & Oxygen Therapy: Patient Spontanous Breathing and Patient connected to nasal cannula oxygen  Post-op Assessment: Report given to RN, Post -op Vital signs reviewed and stable and Patient moving all extremities  Post vital signs: Reviewed and stable  Last Vitals:  Vitals Value Taken Time  BP 152/106 07/20/2018  3:14 PM  Temp    Pulse 111 07/20/2018  3:16 PM  Resp 20 07/20/2018  3:16 PM  SpO2 96 % 07/20/2018  3:16 PM  Vitals shown include unvalidated device data.  Last Pain:  Vitals:   07/20/18 0802  TempSrc: Oral  PainSc: 0-No pain      Patients Stated Pain Goal: 0 (07/20/18 0130)  Complications: No apparent anesthesia complications

## 2018-07-20 NOTE — H&P (Signed)
H&P update  The surgical history has been reviewed and remains accurate without interval change.  The patient was re-examined and patient's physiologic condition has not changed significantly in the last 30 days. The condition still exists that makes this procedure necessary. The treatment plan remains the same, without new options for care.  No new pharmacological allergies or types of therapy has been initiated that would change the plan or the appropriateness of the plan.  The patient and/or family understand the potential benefits and risks.  Mayra ReelN. Michael Xu, MD 07/20/2018 8:06 AM

## 2018-07-20 NOTE — Consult Note (Signed)
            [facility] Navigator  07/20/2018  James DustJonathan S Roberson [birth year] [id number]   Went to see patient at the bedside to identify possible discharge needs butheis off the unit inthe OR for a procedure at this time (irrigation and debridement of right lower leg).  Will attempt to see patient at another time whenheis available in the room.     Addendum (07/24/18):  Went back to seepatientat the bedside to identify possible discharge needs but he was already discharged home per staff.  Patient went home with home health services per therapy recommendation. [facility])  Per MD note, patient presented with erythema and swelling of the right lower extremity that had led to this admission. (right lower extremity cellulitis with abscesses- developed small blisters/ pus pockets in the posterior aspect of his calf status post irrigation and debridement of right lower leg;  DM, HTN, gout, chronic kidney disease)  Patient has discharge instruction to follow-up with orthopedics (for wound recheck) in 1 week and primary care provider follow-up in 1 week. Inpatient CM confirmed that patient's primary care provider is Dr. Merri BrunetteWalter Pharr [facility].  Primary care provider's office is listed as providing transition of care (TOC) follow-up.   For additional questions please contact:  Karin GoldenLorraine A. Rajah Lamba, BSN, RN-BC [facility] PRIMARY CARE Navigator Cell: [phone]

## 2018-07-20 NOTE — Social Work (Signed)
CSW acknowledging consult for SNF placement. Will follow for therapy recommendations.   Desaree Downen, MSW, LCSWA [facility] Social Work [phone]   

## 2018-07-20 NOTE — Progress Notes (Signed)
Pharmacy Antibiotic Note  James Roberson is a 72 y.o. male admitted on 07/17/2018 with cellulitis.  Pharmacy has been consulted for vancomycin dosing. Possible surgical intervention for small abscessed per ortho. Vancomycin to continue. Day #4 of vancomycin.   Plan: Continue Vancomycin 1500 mg IV q24 hours F/u renal function, cultures and clinical course VT as needed.   Height: 5\' 9"  (175.3 cm) Weight: 193 lb 5.5 oz (87.7 kg) IBW/kg (Calculated) : 70.7  Temp (24hrs), Avg:98.1 F (36.7 C), Min:97.7 F (36.5 C), Max:98.4 F (36.9 C)  Recent Labs  Lab 07/17/18 1621 07/17/18 1629 07/17/18 1844 07/18/18 0123 07/18/18 0124 07/18/18 0316 07/19/18 0338 07/20/18 0249  WBC 8.8  --   --   --  9.2  --  9.0 9.8  CREATININE 1.52*  --   --   --  1.34*  --  1.45* 1.57*  LATICACIDVEN  --  3.38* 3.46* 2.6*  --  2.6*  --   --     Estimated Creatinine Clearance: 46.6 mL/min (A) (by C-G formula based on SCr of 1.57 mg/dL (H)).    Allergies  Allergen Reactions  . Avelox Abc [Moxifloxacin] Anaphylaxis  . Tape Rash    PAPER TAPE ONLY!!  . Zolpidem Tartrate Other (See Comments)    Hallucinations  . Ambien Cr [Zolpidem Tartrate Er] Other (See Comments)     Hallucinations  . Zolpidem Other (See Comments)     Hallucinations - name brand only - tolerates generic   . Other Rash    Nuderm and Coban     Walden Statz A. Jeanella CrazePierce, PharmD, BCPS Clinical Pharmacist [place] Pager: [phone] Please utilize Amion for appropriate phone number to reach the unit pharmacist [facility])   07/20/2018 8:18 AM

## 2018-07-20 NOTE — Anesthesia Preprocedure Evaluation (Addendum)
Anesthesia Evaluation  Patient identified by MRN, date of birth, ID band Patient awake    Reviewed: Allergy & Precautions, NPO status , Patient's Chart, lab work & pertinent test results  Airway Mallampati: II  TM Distance: >3 FB Neck ROM: Full    Dental no notable dental hx. (+) Dental Advidsory Given, Teeth Intact   Pulmonary former smoker,    Pulmonary exam normal breath sounds clear to auscultation       Cardiovascular hypertension, Normal cardiovascular exam Rhythm:Regular Rate:Normal     Neuro/Psych negative neurological ROS  negative psych ROS   GI/Hepatic negative GI ROS, Neg liver ROS,   Endo/Other  diabetes, Insulin Dependent, Oral Hypoglycemic Agents  Renal/GU Renal disease     Musculoskeletal negative musculoskeletal ROS (+)   Abdominal   Peds  Hematology  (+) anemia , HLD   Anesthesia Other Findings cellulitis  Reproductive/Obstetrics                           Anesthesia Physical Anesthesia Plan  ASA: III  Anesthesia Plan: General   Post-op Pain Management:    Induction: Intravenous  PONV Risk Score and Plan: 2 and Ondansetron, Dexamethasone, Midazolam and Treatment may vary due to age or medical condition  Airway Management Planned: LMA  Additional Equipment:   Intra-op Plan:   Post-operative Plan: Extubation in OR  Informed Consent: I have reviewed the patients History and Physical, chart, labs and discussed the procedure including the risks, benefits and alternatives for the proposed anesthesia with the patient or authorized representative who has indicated his/her understanding and acceptance.   Dental advisory given and Dental Advisory Given  Plan Discussed with: CRNA  Anesthesia Plan Comments:        Anesthesia Quick Evaluation

## 2018-07-21 LAB — BASIC METABOLIC PANEL
Anion gap: 9 (ref 5–15)
BUN: 10 mg/dL (ref 8–23)
CO2: 27 mmol/L (ref 22–32)
Calcium: 8.4 mg/dL — ABNORMAL LOW (ref 8.9–10.3)
Chloride: 103 mmol/L (ref 98–111)
Creatinine, Ser: 1.52 mg/dL — ABNORMAL HIGH (ref 0.61–1.24)
GFR calc Af Amer: 52 mL/min — ABNORMAL LOW (ref >60)
GFR calc non Af Amer: 45 mL/min — ABNORMAL LOW (ref >60)
Glucose, Bld: 159 mg/dL — ABNORMAL HIGH (ref 70–99)
Potassium: 3.8 mmol/L (ref 3.5–5.1)
Sodium: 139 mmol/L (ref 135–145)

## 2018-07-21 LAB — GLUCOSE, CAPILLARY
Glucose-Capillary: 148 mg/dL — ABNORMAL HIGH (ref 70–99)
Glucose-Capillary: 153 mg/dL — ABNORMAL HIGH (ref 70–99)
Glucose-Capillary: 186 mg/dL — ABNORMAL HIGH (ref 70–99)
Glucose-Capillary: 169 mg/dL — ABNORMAL HIGH (ref 70–99)
Glucose-Capillary: 142 mg/dL — ABNORMAL HIGH (ref 70–99)

## 2018-07-21 NOTE — Plan of Care (Signed)
  Problem: Education: Goal: Knowledge of General Education information will improve Description: Including pain rating scale, medication(s)/side effects and non-pharmacologic comfort measures Outcome: Progressing   Problem: Health Behavior/Discharge Planning: Goal: Ability to manage health-related needs will improve Outcome: Progressing   Problem: Elimination: Goal: Will not experience complications related to urinary retention Outcome: Progressing   Problem: Pain Managment: Goal: General experience of comfort will improve Outcome: Progressing   

## 2018-07-21 NOTE — Anesthesia Postprocedure Evaluation (Signed)
Anesthesia Post Note  Patient: James DustJonathan S Roberson  Procedure(s) Performed: IRRIGATION AND DEBRIDEMENT  OF LEG (Right Leg Lower)     Patient location during evaluation: PACU Anesthesia Type: General Level of consciousness: awake and alert Pain management: pain level controlled Vital Signs Assessment: post-procedure vital signs reviewed and stable Respiratory status: spontaneous breathing, nonlabored ventilation, respiratory function stable and patient connected to nasal cannula oxygen Cardiovascular status: blood pressure returned to baseline and stable Postop Assessment: no apparent nausea or vomiting Anesthetic complications: no    Last Vitals:  Vitals:   07/20/18 2003 07/20/18 2135  BP: (!) 149/70 132/71  Pulse: 77 77  Resp:  18  Temp:  36.9 C  SpO2: 98% 98%    Last Pain:  Vitals:   07/20/18 2231  TempSrc:   PainSc: Asleep                 Harla Mensch P Alireza Pollack

## 2018-07-21 NOTE — Evaluation (Signed)
Physical Therapy Evaluation Patient Details Name: James DustJonathan S Mondo MRN: [id number] DOB: 08/02/1946 Today's Date: 07/21/2018   History of Present Illness  Patient is a 72 y.o male admitted with swelling of RLE found to have abscess, now s/p I&D 12/13. PMH includes: lumbar laminectomy, HTN, HLD, DM,     Clinical Impression  Pt admitted with above diagnosis. Pt currently with functional limitations due to the deficits listed below (see PT Problem List). PTA pt independent living with wife and children in split level home with stairs to enter. Today patient ambulating hallway with and without AD, pain 5/10 but tolerating WBAT during gait and able to walk with supervision. Next PT visit to focus on stair training before safe return home when medically ready.  Pt will benefit from skilled PT to increase their independence and safety with mobility to allow discharge to the venue listed below.       Follow Up Recommendations No PT follow up    Equipment Recommendations  None recommended by PT    Recommendations for Other Services       Precautions / Restrictions Restrictions Weight Bearing Restrictions: No      Mobility  Bed Mobility Overal bed mobility: Modified Independent                Transfers Overall transfer level: Modified independent                  Ambulation/Gait Ambulation/Gait assistance: Supervision Gait Distance (Feet): 200 Feet Assistive device: Rolling walker (2 wheeled);None Gait Pattern/deviations: Step-to pattern     General Gait Details: patient bearing weight on RLE, small antlagic gait pattern noted, mild unsteadiness without RW however no overt LOB and pt states he preferse not to use RW. feel he is safe without, will do stairs next session as he has several in his home  Stairs            Wheelchair Mobility    Modified Rankin (Stroke Patients Only)       Balance Overall balance assessment: No apparent balance deficits (not  formally assessed)                                           Pertinent Vitals/Pain Pain Assessment: 0-10 Pain Score: 5  Pain Descriptors / Indicators: Jabbing Pain Intervention(s): Limited activity within patient's tolerance    Home Living Family/patient expects to be discharged to:: Private residence Living Arrangements: Spouse/significant other Available Help at Discharge: Family;Available 24 hours/day Type of Home: House Home Access: Stairs to enter Entrance Stairs-Rails: Left Entrance Stairs-Number of Steps: 4 Home Layout: (Split Level) Home Equipment: Cane - single point      Prior Function Level of Independence: Independent         Comments: driving     Hand Dominance        Extremity/Trunk Assessment   Upper Extremity Assessment Upper Extremity Assessment: Overall WFL for tasks assessed    Lower Extremity Assessment Lower Extremity Assessment: Overall WFL for tasks assessed       Communication   Communication: No difficulties  Cognition Arousal/Alertness: Awake/alert Behavior During Therapy: WFL for tasks assessed/performed Overall Cognitive Status: Within Functional Limits for tasks assessed  General Comments      Exercises     Assessment/Plan    PT Assessment Patient needs continued PT services  PT Problem List Decreased strength;Decreased mobility       PT Treatment Interventions DME instruction;Gait training;Functional mobility training;Stair training;Therapeutic activities;Therapeutic exercise    PT Goals (Current goals can be found in the Care Plan section)  Acute Rehab PT Goals Patient Stated Goal: go home when ready PT Goal Formulation: With patient Time For Goal Achievement: 08/04/18 Potential to Achieve Goals: Good    Frequency Min 3X/week   Barriers to discharge        Co-evaluation               AM-PAC PT "6 Clicks" Mobility  Outcome  Measure Help needed turning from your back to your side while in a flat bed without using bedrails?: None Help needed moving from lying on your back to sitting on the side of a flat bed without using bedrails?: None Help needed moving to and from a bed to a chair (including a wheelchair)?: None Help needed standing up from a chair using your arms (e.g., wheelchair or bedside chair)?: None Help needed to walk in hospital room?: A Little Help needed climbing 3-5 steps with a railing? : A Little 6 Click Score: 22    End of Session Equipment Utilized During Treatment: Gait belt Activity Tolerance: Patient tolerated treatment well Patient left: in bed;with call bell/phone within reach Nurse Communication: Mobility status PT Visit Diagnosis: Unsteadiness on feet (R26.81)    Time: 7829-5621 PT Time Calculation (min) (ACUTE ONLY): 35 min   Charges:   PT Evaluation $PT Eval Low Complexity: 1 Low PT Treatments $Gait Training: 8-22 mins       Etta Grandchild, PT, DPT Acute Rehabilitation Services Pager: [phone] Office: [phone]    Etta Grandchild 07/21/2018, 3:07 PM

## 2018-07-21 NOTE — Progress Notes (Signed)
PROGRESS NOTE        PATIENT DETAILS Name: James Roberson Age: 72 y.o. Sex: male Date of Birth: 02/18/1946 Admit Date: 07/17/2018 Admitting Physician Eduard Clos, MD WUJ:[id number], No Pcp Per  Brief Narrative: Patient is a 72 y.o. male with history of DM-2, hypertension, dyslipidemia-presenting with erythema, swelling of the right lower extremity.  MRI of the right lower extremity demonstrating small subcutaneous abscesses.  See below for further details  Subjective: Feels better after he had the I&D yesterday.  No major issues overnight.  Pain at the operative site appears to be controlled.  Assessment/Plan: Right lower extremity cellulitis with abscesses: Due to worsening in spite of IV antimicrobial therapy-very close to her baseline.  Development of numerous small subcutaneous abscesses-patient underwent I&D on 12/13.  Intraoperative cultures pending-remains on IV vancomycin.  Will await further recommendations from orthopedics.   Hypertension: Controlled-continue lisinopril.    DM-2: CBGs stable with SSI, continue to hold oral hypoglycemics-we will resume on discharge.    Dyslipidemia: Continue statin  CKD stage III: Creatinine close to usual baseline.  Follow electrolytes.    Gout: Appears to be stable-no evidence of flare.  DVT Prophylaxis: Prophylactic Lovenox  Code Status: Full code  Family Communication: None at bedside  Disposition Plan: Remain inpatient  Antimicrobial agents: Anti-infectives (From admission, onward)   Start     Dose/Rate Route Frequency Ordered Stop   07/18/18 2000  vancomycin (VANCOCIN) 1,500 mg in sodium chloride 0.9 % 500 mL IVPB     1,500 mg 250 mL/hr over 120 Minutes Intravenous Every 24 hours 07/17/18 2348     07/18/18 0000  ceFEPIme (MAXIPIME) 2 g in sodium chloride 0.9 % 100 mL IVPB  Status:  Discontinued     2 g 200 mL/hr over 30 Minutes Intravenous Every 24 hours 07/17/18 2348 07/18/18 1604   07/17/18 2315  ceFEPIme (MAXIPIME) 2 g in sodium chloride 0.9 % 100 mL IVPB     2 g 200 mL/hr over 30 Minutes Intravenous  Once 07/17/18 2313 07/18/18 0038   07/17/18 2000  vancomycin (VANCOCIN) 2,000 mg in sodium chloride 0.9 % 500 mL IVPB     2,000 mg 250 mL/hr over 120 Minutes Intravenous  Once 07/17/18 1840 07/17/18 2223   07/17/18 1900  piperacillin-tazobactam (ZOSYN) IVPB 3.375 g     3.375 g 100 mL/hr over 30 Minutes Intravenous  Once 07/17/18 1840 07/17/18 2129      Procedures: None  CONSULTS:  orthopedic surgery  Time spent: 25- minutes-Greater than 50% of this time was spent in counseling, explanation of diagnosis, planning of further management, and coordination of care.  MEDICATIONS: Scheduled Meds: . acetaminophen  500 mg Oral Q6H  . atorvastatin  40 mg Oral QHS  . buPROPion  150 mg Oral Daily  . docusate sodium  100 mg Oral BID  . enoxaparin (LOVENOX) injection  40 mg Subcutaneous Q24H  . gabapentin  600 mg Oral QHS  . insulin aspart  0-9 Units Subcutaneous TID WC  . ketorolac  15 mg Intravenous Q6H  . lisinopril  10 mg Oral QHS  . mupirocin ointment  1 application Nasal BID  . vitamin B-12  500 mcg Oral QODAY   Continuous Infusions: . sodium chloride 125 mL/hr at 07/20/18 1741  . lactated ringers 10 mL/hr at 07/20/18 1323  . methocarbamol (ROBAXIN) IV    .  vancomycin 1,500 mg (07/20/18 2001)   PRN Meds:.acetaminophen, diphenhydrAMINE, hydrALAZINE, HYDROcodone-acetaminophen, HYDROcodone-acetaminophen, magnesium citrate, methocarbamol **OR** methocarbamol (ROBAXIN) IV, metoCLOPramide **OR** metoCLOPramide (REGLAN) injection, morphine injection, ondansetron **OR** ondansetron (ZOFRAN) IV, polyethylene glycol, sorbitol   PHYSICAL EXAM: Vital signs: Vitals:   07/20/18 1939 07/20/18 2003 07/20/18 2135 07/21/18 0745  BP: (!) 157/72 (!) 149/70 132/71 130/79  Pulse:  77 77 68  Resp:   18   Temp: 98.2 F (36.8 C)  98.4 F (36.9 C) (!) 97.4 F (36.3 C)    TempSrc: Oral  Oral Oral  SpO2: 95% 98% 98% 99%  Weight:      Height:       Filed Weights   07/17/18 2300  Weight: 87.7 kg   Body mass index is 28.55 kg/m.   General appearance:Awake, alert, not in any distress.  Eyes:no scleral icterus. HEENT: Atraumatic and Normocephalic Neck: supple, no JVD. Resp:Good air entry bilaterally,no rales or rhonchi CVS: S1 S2 regular, no murmurs.  GI: Bowel sounds present, Non tender and not distended with no gaurding, rigidity or rebound. Extremities: B/L Lower Ext shows no edema, both legs are warm to touch.  Right lower extremity in bandage-did not open. Neurology:  Non focal Musculoskeletal:No digital cyanosis Skin:No Rash, warm and dry Wounds:N/A  I have personally reviewed following labs and imaging studies  LABORATORY DATA: CBC: Recent Labs  Lab 07/17/18 1621 07/18/18 0124 07/19/18 0338 07/20/18 0249  WBC 8.8 9.2 9.0 9.8  NEUTROABS 5.9  --   --   --   HGB 14.0 12.0* 12.1* 11.6*  HCT 45.8 38.0* 38.4* 37.5*  MCV 88.4 86.6 87.5 86.4  PLT 348 277 270 294    Basic Metabolic Panel: Recent Labs  Lab 07/17/18 1621 07/18/18 0124 07/19/18 0338 07/20/18 0249 07/21/18 0703  NA 139 141 139 138 139  K 4.5 5.0 3.8 4.0 3.8  CL 104 111 107 104 103  CO2 22 22 19* 23 27  GLUCOSE 236* 143* 114* 211* 159*  BUN 22 19 11 9 10   CREATININE 1.52* 1.34* 1.45* 1.57* 1.52*  CALCIUM 9.7 8.8* 8.8* 9.0 8.4*    GFR: Estimated Creatinine Clearance: 48.2 mL/min (A) (by C-G formula based on SCr of 1.52 mg/dL (H)).  Liver Function Tests: Recent Labs  Lab 07/17/18 1621 07/18/18 0124  AST 24 18  ALT 22 21  ALKPHOS 62 48  BILITOT 0.7 0.8  PROT 6.7 5.9*  ALBUMIN 3.8 3.2*   No results for input(s): LIPASE, AMYLASE in the last 168 hours. No results for input(s): AMMONIA in the last 168 hours.  Coagulation Profile: No results for input(s): INR, PROTIME in the last 168 hours.  Cardiac Enzymes: No results for input(s): CKTOTAL, CKMB,  CKMBINDEX, TROPONINI in the last 168 hours.  BNP (last 3 results) No results for input(s): PROBNP in the last 8760 hours.  HbA1C: No results for input(s): HGBA1C in the last 72 hours.  CBG: Recent Labs  Lab 07/20/18 1514 07/20/18 1655 07/20/18 2034 07/21/18 0606 07/21/18 0735  GLUCAP 123* 153* 163* 148* 153*    Lipid Profile: No results for input(s): CHOL, HDL, LDLCALC, TRIG, CHOLHDL, LDLDIRECT in the last 72 hours.  Thyroid Function Tests: No results for input(s): TSH, T4TOTAL, FREET4, T3FREE, THYROIDAB in the last 72 hours.  Anemia Panel: No results for input(s): VITAMINB12, FOLATE, FERRITIN, TIBC, IRON, RETICCTPCT in the last 72 hours.  Urine analysis:    Component Value Date/Time   COLORURINE YELLOW 12/25/2016 1721   APPEARANCEUR CLEAR 12/25/2016 1721  LABSPEC 1.013 12/25/2016 1721   PHURINE 5.0 12/25/2016 1721   GLUCOSEU 50 (A) 12/25/2016 1721   HGBUR MODERATE (A) 12/25/2016 1721   BILIRUBINUR NEGATIVE 12/25/2016 1721   KETONESUR NEGATIVE 12/25/2016 1721   PROTEINUR NEGATIVE 12/25/2016 1721   UROBILINOGEN 0.2 10/30/2014 0004   NITRITE NEGATIVE 12/25/2016 1721   LEUKOCYTESUR NEGATIVE 12/25/2016 1721    Sepsis Labs: Lactic Acid, Venous    Component Value Date/Time   LATICACIDVEN 2.6 (HH) 07/18/2018 0316    MICROBIOLOGY: Recent Results (from the past 240 hour(s))  Blood culture (routine x 2)     Status: None (Preliminary result)   Collection Time: 07/17/18  7:50 PM  Result Value Ref Range Status   Specimen Description BLOOD LEFT ANTECUBITAL  Final   Special Requests   Final    BOTTLES DRAWN AEROBIC AND ANAEROBIC Blood Culture adequate volume   Culture   Final    NO GROWTH 3 DAYS Performed at [facility], [address], [place], [place] [address]    Report Status PENDING  Incomplete  Blood culture (routine x 2)     Status: None (Preliminary result)   Collection Time: 07/17/18  7:57 PM  Result Value Ref Range Status   Specimen Description  BLOOD LEFT HAND  Final   Special Requests   Final    BOTTLES DRAWN AEROBIC AND ANAEROBIC Blood Culture results may not be optimal due to an inadequate volume of blood received in culture bottles   Culture   Final    NO GROWTH 3 DAYS Performed at [facility], [address], [place], [place] [address]    Report Status PENDING  Incomplete  Surgical pcr screen     Status: Abnormal   Collection Time: 07/20/18 12:28 AM  Result Value Ref Range Status   MRSA, PCR NEGATIVE NEGATIVE Final   Staphylococcus aureus POSITIVE (A) NEGATIVE Final    Comment: (NOTE) The Xpert SA Assay (FDA approved for NASAL specimens in patients 64 years of age and older), is one component of a comprehensive surveillance program. It is not intended to diagnose infection nor to guide or monitor treatment. Performed at [facility], [address], [place], [place] [address]   Aerobic/Anaerobic Culture (surgical/deep wound)     Status: None (Preliminary result)   Collection Time: 07/20/18  2:37 PM  Result Value Ref Range Status   Specimen Description ABSCESS RIGHT LEG  Final   Special Requests NONE  Final   Gram Stain   Final    ABUNDANT WBC PRESENT, PREDOMINANTLY PMN NO ORGANISMS SEEN Performed at [facility], [address] [place], [place] [address]    Culture PENDING  Incomplete   Report Status PENDING  Incomplete    RADIOLOGY STUDIES/RESULTS: Dg Tibia/fibula Right  Result Date: 07/17/2018 CLINICAL DATA:  Cellulitis EXAM: RIGHT TIBIA AND FIBULA - 2 VIEW COMPARISON:  11/10/2014 FINDINGS: No skeletal abnormality. No soft tissue mass or gas in the soft tissues. No foreign body. Mild calcification in the medial leg may represent dermal calcification and is unchanged from the prior study. IMPRESSION: No acute bony or soft tissue abnormality right lower leg Electronically Signed   By: Marlan Palau M.D.   On: 07/17/2018 19:22   Mr Tibia Fibula Right Wo Contrast  Result Date:  07/18/2018 CLINICAL DATA:  Right lower leg pain and swelling over the past 2 weeks which worsened over the past 2 days. The patient is diabetic. EXAM: MRI OF LOWER RIGHT EXTREMITY  WITHOUT CONTRAST TECHNIQUE: Multiplanar, multisequence MR imaging of the right lower leg was performed. No intravenous contrast was administered. COMPARISON:  Plain films right lower leg 07/17/2018. FINDINGS: Bones/Joint/Cartilage Bone marrow signal is normal throughout without fracture, stress change or focal lesion. No evidence of osteomyelitis. Ligaments Negative. Muscles and Tendons Mildly increased T2 signal is seen in the distal aspect of the medial gastrocnemius and underlying medial periphery of the soleus. No intramuscular fluid collection. No tear. There is some fatty atrophy of lower leg musculature bilaterally which appears symmetric. Soft tissues Subcutaneous increased T2 signal in the lower legs bilaterally is much worse on the right. A fluid collection in the posterior, medial soft tissues of the right lower leg measures 0.8 cm transverse x 0.9 cm AP x 1.3 cm craniocaudal. A second small fluid collection more inferiorly in the subcutaneous tissues measures 0.8 cm craniocaudal by 0.6 cm transverse by 0.6 cm AP. A third tiny fluid collection in the subcutaneous tissues measuring 0.9 cm is identified. Finally, there is a fourth fluid collection in the subcutaneous tissues lateral to the Achilles tendon which measures 0.6 cm in diameter. IMPRESSION: Extensive subcutaneous edema about the right lower leg is compatible with cellulitis. Mild edema in the inferior aspect of the medial gastrocnemius and lateral periphery of the underlying soleus could be due to myositis or related to denervation atrophy. Four subcutaneous fluid collections measuring 1.3 cm or less in diameter in the right lower leg as described above are worrisome for abscesses. Electronically Signed   By: Drusilla Kannerhomas  Dalessio M.D.   On: 07/18/2018 12:34   [place] Rt Lower  Extrem Ltd Soft Tissue Non Vascular  Result Date: 07/17/2018 CLINICAL DATA:  Cellulitis EXAM: ULTRASOUND right LOWER EXTREMITY LIMITED TECHNIQUE: Ultrasound examination of the lower extremity soft tissues was performed in the area of clinical concern. COMPARISON:  Tib-fib x-rays from today FINDINGS: Scanning limited to the area of concern in the right lower leg soft tissues. There is subcutaneous edema. No fluid collection or mass lesion. Negative for abscess. Increased blood flow to the soft tissues on Doppler consistent with cellulitis IMPRESSION: Diffuse soft tissue swelling.  Negative for abscess right lower leg. Electronically Signed   By: Marlan Palauharles  Clark M.D.   On: 07/17/2018 19:21     LOS: 4 days   Jeoffrey MassedShanker , MD  [facility]  If 7PM-7AM, please contact night-coverage  Please page via [email or web address] [email or web address]-click on MD name and type text message  07/21/2018, 11:38 AM

## 2018-07-21 NOTE — Progress Notes (Signed)
PATIENT ID: James Roberson        MRN:  [id number]          DOB/AGE: Apr 17, 1946 / 72 y.o.    Norlene CampbellPeter Sunnie Odden, MD   Jacqualine CodeBrian Petrarca, PA-C [address] [place], [place]  [id number]                             [phone]   PROGRESS NOTE  Subjective:  negative for Chest Pain  negative for Shortness of Breath  negative for Nausea/Vomiting   negative for Calf Pain    Tolerating Diet: yes         Patient reports pain as mild.     comfortable  Objective: Vital signs in last 24 hours:    Patient Vitals for the past 24 hrs:  BP Temp Temp src Pulse Resp SpO2  07/21/18 0745 130/79 (!) 97.4 F (36.3 C) Oral 68 - 99 %  07/20/18 2135 132/71 98.4 F (36.9 C) Oral 77 18 98 %  07/20/18 2003 (!) 149/70 - - 77 - 98 %  07/20/18 1939 (!) 157/72 98.2 F (36.8 C) Oral - - 95 %  07/20/18 1700 (!) 155/80 97.9 F (36.6 C) Oral 85 - 96 %  07/20/18 1642 - - - - - 98 %  07/20/18 1615 (!) 153/79 97.7 F (36.5 C) - 84 14 99 %  07/20/18 1600 (!) 163/78 - - 91 14 98 %  07/20/18 1545 (!) 179/96 - - 99 16 100 %  07/20/18 1530 (!) 190/95 - - (!) 103 15 100 %  07/20/18 1515 (!) 198/97 (!) 97.5 F (36.4 C) - (!) 112 15 94 %      Intake/Output from previous day:   12/13 0701 - 12/14 0700 In: 833 [P.O.:120; I.V.:613] Out: 320 [Urine:300]   Intake/Output this shift:   12/14 0701 - 12/14 1900 In: 300 [P.O.:300] Out: -    Intake/Output      12/13 0701 - 12/14 0700 12/14 0701 - 12/15 0700   P.O. 120 300   I.V. (mL/kg) 613 (7)    IV Piggyback 100    Total Intake(mL/kg) 833 (9.5) 300 (3.4)   Urine (mL/kg/hr) 300 (0.1)    Blood 20    Total Output 320    Net +513 +300           LABORATORY DATA: Recent Labs    07/17/18 1621 07/18/18 0124 07/19/18 0338 07/20/18 0249  WBC 8.8 9.2 9.0 9.8  HGB 14.0 12.0* 12.1* 11.6*  HCT 45.8 38.0* 38.4* 37.5*  PLT 348 277 270 294   Recent Labs    07/17/18 1621 07/18/18 0124 07/19/18 0338 07/20/18 0249 07/21/18 0703  NA 139 141 139 138 139  K  4.5 5.0 3.8 4.0 3.8  CL 104 111 107 104 103  CO2 22 22 19* 23 27  BUN 22 19 11 9 10   CREATININE 1.52* 1.34* 1.45* 1.57* 1.52*  GLUCOSE 236* 143* 114* 211* 159*  CALCIUM 9.7 8.8* 8.8* 9.0 8.4*   Lab Results  Component Value Date   INR 1.1 10/29/2007    Recent Radiographic Studies :  Dg Tibia/fibula Right  Result Date: 07/17/2018 CLINICAL DATA:  Cellulitis EXAM: RIGHT TIBIA AND FIBULA - 2 VIEW COMPARISON:  11/10/2014 FINDINGS: No skeletal abnormality. No soft tissue mass or gas in the soft tissues. No foreign body. Mild calcification in the medial leg may represent dermal calcification and is unchanged from the  prior study. IMPRESSION: No acute bony or soft tissue abnormality right lower leg Electronically Signed   By: Marlan Palau M.D.   On: 07/17/2018 19:22   Mr Tibia Fibula Right Wo Contrast  Result Date: 07/18/2018 CLINICAL DATA:  Right lower leg pain and swelling over the past 2 weeks which worsened over the past 2 days. The patient is diabetic. EXAM: MRI OF LOWER RIGHT EXTREMITY WITHOUT CONTRAST TECHNIQUE: Multiplanar, multisequence MR imaging of the right lower leg was performed. No intravenous contrast was administered. COMPARISON:  Plain films right lower leg 07/17/2018. FINDINGS: Bones/Joint/Cartilage Bone marrow signal is normal throughout without fracture, stress change or focal lesion. No evidence of osteomyelitis. Ligaments Negative. Muscles and Tendons Mildly increased T2 signal is seen in the distal aspect of the medial gastrocnemius and underlying medial periphery of the soleus. No intramuscular fluid collection. No tear. There is some fatty atrophy of lower leg musculature bilaterally which appears symmetric. Soft tissues Subcutaneous increased T2 signal in the lower legs bilaterally is much worse on the right. A fluid collection in the posterior, medial soft tissues of the right lower leg measures 0.8 cm transverse x 0.9 cm AP x 1.3 cm craniocaudal. A second small fluid  collection more inferiorly in the subcutaneous tissues measures 0.8 cm craniocaudal by 0.6 cm transverse by 0.6 cm AP. A third tiny fluid collection in the subcutaneous tissues measuring 0.9 cm is identified. Finally, there is a fourth fluid collection in the subcutaneous tissues lateral to the Achilles tendon which measures 0.6 cm in diameter. IMPRESSION: Extensive subcutaneous edema about the right lower leg is compatible with cellulitis. Mild edema in the inferior aspect of the medial gastrocnemius and lateral periphery of the underlying soleus could be due to myositis or related to denervation atrophy. Four subcutaneous fluid collections measuring 1.3 cm or less in diameter in the right lower leg as described above are worrisome for abscesses. Electronically Signed   By: Drusilla Kanner M.D.   On: 07/18/2018 12:34   [place] Rt Lower Extrem Ltd Soft Tissue Non Vascular  Result Date: 07/17/2018 CLINICAL DATA:  Cellulitis EXAM: ULTRASOUND right LOWER EXTREMITY LIMITED TECHNIQUE: Ultrasound examination of the lower extremity soft tissues was performed in the area of clinical concern. COMPARISON:  Tib-fib x-rays from today FINDINGS: Scanning limited to the area of concern in the right lower leg soft tissues. There is subcutaneous edema. No fluid collection or mass lesion. Negative for abscess. Increased blood flow to the soft tissues on Doppler consistent with cellulitis IMPRESSION: Diffuse soft tissue swelling.  Negative for abscess right lower leg. Electronically Signed   By: Marlan Palau M.D.   On: 07/17/2018 19:21     Examination:  General appearance: alert, cooperative and no distress  Wound Exam: clean, dry, intact   Drainage:  None: wound tissue dry  Motor Exam: EHL and FHL Intact  Sensory Exam: Superficial Peroneal and Deep Peroneal normal  Vascular Exam: Normal  Assessment:    1 Day Post-Op  Procedure(s) (LRB): IRRIGATION AND DEBRIDEMENT  OF LEG (Right)  ADDITIONAL DIAGNOSIS:    Principal Problem:   Cellulitis of right lower extremity Active Problems:   GOUT   Essential hypertension   Cellulitis   CKD (chronic kidney disease) stage 3, GFR 30-59 ml/min (HCC)   Diabetes mellitus type 2 in obese (HCC)     Plan  DISCHARGE PLAN: Home  DISCHARGE NEEDS: IV Antibiotics per ID, continue wet to dry dressings        [name], PA-C [facility]  07/21/2018 11:35 AM  Patient ID: James Roberson, male   DOB: 1946/03/22, 72 y.o.   MRN: [id number]

## 2018-07-22 ENCOUNTER — Encounter (HOSPITAL_COMMUNITY): Payer: Self-pay | Admitting: Orthopaedic Surgery

## 2018-07-22 DIAGNOSIS — L02419 Cutaneous abscess of limb, unspecified: Secondary | ICD-10-CM

## 2018-07-22 LAB — GLUCOSE, CAPILLARY
Glucose-Capillary: 126 mg/dL — ABNORMAL HIGH (ref 70–99)
Glucose-Capillary: 181 mg/dL — ABNORMAL HIGH (ref 70–99)

## 2018-07-22 LAB — BASIC METABOLIC PANEL
Anion gap: 12 (ref 5–15)
BUN: 11 mg/dL (ref 8–23)
CO2: 21 mmol/L — ABNORMAL LOW (ref 22–32)
Calcium: 8.4 mg/dL — ABNORMAL LOW (ref 8.9–10.3)
Chloride: 105 mmol/L (ref 98–111)
Creatinine, Ser: 1.55 mg/dL — ABNORMAL HIGH (ref 0.61–1.24)
GFR calc Af Amer: 51 mL/min — ABNORMAL LOW (ref >60)
GFR calc non Af Amer: 44 mL/min — ABNORMAL LOW (ref >60)
GLUCOSE: 188 mg/dL — AB (ref 70–99)
Potassium: 3.9 mmol/L (ref 3.5–5.1)
Sodium: 138 mmol/L (ref 135–145)

## 2018-07-22 MED ORDER — CEPHALEXIN 500 MG PO CAPS
500.0000 mg | ORAL_CAPSULE | Freq: Three times a day (TID) | ORAL | Status: DC
  Administered 2018-07-22 – 2018-07-24 (×7): 500 mg via ORAL
  Filled 2018-07-22 (×7): qty 1

## 2018-07-22 NOTE — Progress Notes (Signed)
PROGRESS NOTE        PATIENT DETAILS Name: James Roberson Age: 72 y.o. Sex: male Date of Birth: [birth year] Admit Date: 07/17/2018 Admitting Physician Eduard Clos, MD ZOX:[id number], No Pcp Per  Brief Narrative: Patient is a 72 y.o. male with history of DM-2, hypertension, dyslipidemia-presenting with erythema, swelling of the right lower extremity.  MRI of the right lower extremity demonstrating small subcutaneous abscesses.  See below for further details  Subjective: No major issues overnight-pain in the right lower extremity is stable.  Assessment/Plan: Right lower extremity cellulitis with abscesses: Due to worsening/development of leg abscesses in spite of IV antimicrobial therapy-underwent I&D on 12/13.  Wound culture positive for staph aureus-awaiting further sensitivities.  In the meantime-continue with IV vancomycin-suspect that once we have final culture results-we can transition to oral antimicrobial therapy and discharge home with home health services-most likely on 12/16.  Hypertension: Trolled-continue lisinopril.  DM-2: CBG stable with SSI-we will resume oral hypoglycemics on discharge.  Dyslipidemia: Continue statin  CKD stage III: Creatinine close to usual baseline.  Follow electrolytes.    Gout: Appears to be stable-no evidence of flare.  DVT Prophylaxis: Prophylactic Lovenox  Code Status: Full code  Family Communication: None at bedside  Disposition Plan: Remain inpatient  Antimicrobial agents: Anti-infectives (From admission, onward)   Start     Dose/Rate Route Frequency Ordered Stop   07/18/18 2000  vancomycin (VANCOCIN) 1,500 mg in sodium chloride 0.9 % 500 mL IVPB     1,500 mg 250 mL/hr over 120 Minutes Intravenous Every 24 hours 07/17/18 2348     07/18/18 0000  ceFEPIme (MAXIPIME) 2 g in sodium chloride 0.9 % 100 mL IVPB  Status:  Discontinued     2 g 200 mL/hr over 30 Minutes Intravenous Every 24 hours 07/17/18  2348 07/18/18 1604   07/17/18 2315  ceFEPIme (MAXIPIME) 2 g in sodium chloride 0.9 % 100 mL IVPB     2 g 200 mL/hr over 30 Minutes Intravenous  Once 07/17/18 2313 07/18/18 0038   07/17/18 2000  vancomycin (VANCOCIN) 2,000 mg in sodium chloride 0.9 % 500 mL IVPB     2,000 mg 250 mL/hr over 120 Minutes Intravenous  Once 07/17/18 1840 07/17/18 2223   07/17/18 1900  piperacillin-tazobactam (ZOSYN) IVPB 3.375 g     3.375 g 100 mL/hr over 30 Minutes Intravenous  Once 07/17/18 1840 07/17/18 2129      Procedures: None  CONSULTS:  orthopedic surgery  Time spent: 25- minutes-Greater than 50% of this time was spent in counseling, explanation of diagnosis, planning of further management, and coordination of care.  MEDICATIONS: Scheduled Meds: . atorvastatin  40 mg Oral QHS  . buPROPion  150 mg Oral Daily  . docusate sodium  100 mg Oral BID  . enoxaparin (LOVENOX) injection  40 mg Subcutaneous Q24H  . gabapentin  600 mg Oral QHS  . insulin aspart  0-9 Units Subcutaneous TID WC  . lisinopril  10 mg Oral QHS  . mupirocin ointment  1 application Nasal BID  . vitamin B-12  500 mcg Oral QODAY   Continuous Infusions: . sodium chloride Stopped (07/21/18 1816)  . lactated ringers 10 mL/hr at 07/20/18 1323  . methocarbamol (ROBAXIN) IV    . vancomycin 1,500 mg (07/21/18 2138)   PRN Meds:.acetaminophen, diphenhydrAMINE, hydrALAZINE, HYDROcodone-acetaminophen, HYDROcodone-acetaminophen, magnesium citrate, methocarbamol **OR** methocarbamol (ROBAXIN) IV,  metoCLOPramide **OR** metoCLOPramide (REGLAN) injection, morphine injection, ondansetron **OR** ondansetron (ZOFRAN) IV, polyethylene glycol, sorbitol   PHYSICAL EXAM: Vital signs: Vitals:   07/21/18 2131 07/21/18 2138 07/22/18 0751 07/22/18 0828  BP: (!) 158/89 (!) 158/89 (!) 144/76 132/80  Pulse:  82 82 72  Resp:  18    Temp:  98 F (36.7 C) 97.9 F (36.6 C) (!) 97.5 F (36.4 C)  TempSrc:  Oral    SpO2:   94% 92%  Weight:        Height:       Filed Weights   07/17/18 2300  Weight: 87.7 kg   Body mass index is 28.55 kg/m.   General appearance:Awake, alert, not in any distress.  Eyes:no scleral icterus. HEENT: Atraumatic and Normocephalic Neck: supple, no JVD. Resp:Good air entry bilaterally,no rales or rhonchi CVS: S1 S2 regular, no murmurs.  GI: Bowel sounds present, Non tender and not distended with no gaurding, rigidity or rebound. Extremities: B/L Lower Ext shows no edema, both legs are warm to touch Neurology:  Non focal Psychiatric: Normal judgment and insight. Normal mood. Musculoskeletal:No digital cyanosis Skin:No Rash, warm and dry Wounds:N/A I have personally reviewed following labs and imaging studies  LABORATORY DATA: CBC: Recent Labs  Lab 07/17/18 1621 07/18/18 0124 07/19/18 0338 07/20/18 0249  WBC 8.8 9.2 9.0 9.8  NEUTROABS 5.9  --   --   --   HGB 14.0 12.0* 12.1* 11.6*  HCT 45.8 38.0* 38.4* 37.5*  MCV 88.4 86.6 87.5 86.4  PLT 348 277 270 294    Basic Metabolic Panel: Recent Labs  Lab 07/18/18 0124 07/19/18 0338 07/20/18 0249 07/21/18 0703 07/22/18 0252  NA 141 139 138 139 138  K 5.0 3.8 4.0 3.8 3.9  CL 111 107 104 103 105  CO2 22 19* 23 27 21*  GLUCOSE 143* 114* 211* 159* 188*  BUN 19 11 9 10 11   CREATININE 1.34* 1.45* 1.57* 1.52* 1.55*  CALCIUM 8.8* 8.8* 9.0 8.4* 8.4*    GFR: Estimated Creatinine Clearance: 47.2 mL/min (A) (by C-G formula based on SCr of 1.55 mg/dL (H)).  Liver Function Tests: Recent Labs  Lab 07/17/18 1621 07/18/18 0124  AST 24 18  ALT 22 21  ALKPHOS 62 48  BILITOT 0.7 0.8  PROT 6.7 5.9*  ALBUMIN 3.8 3.2*   No results for input(s): LIPASE, AMYLASE in the last 168 hours. No results for input(s): AMMONIA in the last 168 hours.  Coagulation Profile: No results for input(s): INR, PROTIME in the last 168 hours.  Cardiac Enzymes: No results for input(s): CKTOTAL, CKMB, CKMBINDEX, TROPONINI in the last 168 hours.  BNP (last 3  results) No results for input(s): PROBNP in the last 8760 hours.  HbA1C: No results for input(s): HGBA1C in the last 72 hours.  CBG: Recent Labs  Lab 07/21/18 1136 07/21/18 1650 07/21/18 2112 07/22/18 0748 07/22/18 1151  GLUCAP 186* 169* 142* 126* 181*    Lipid Profile: No results for input(s): CHOL, HDL, LDLCALC, TRIG, CHOLHDL, LDLDIRECT in the last 72 hours.  Thyroid Function Tests: No results for input(s): TSH, T4TOTAL, FREET4, T3FREE, THYROIDAB in the last 72 hours.  Anemia Panel: No results for input(s): VITAMINB12, FOLATE, FERRITIN, TIBC, IRON, RETICCTPCT in the last 72 hours.  Urine analysis:    Component Value Date/Time   COLORURINE YELLOW 12/25/2016 1721   APPEARANCEUR CLEAR 12/25/2016 1721   LABSPEC 1.013 12/25/2016 1721   PHURINE 5.0 12/25/2016 1721   GLUCOSEU 50 (A) 12/25/2016 1721   HGBUR  MODERATE (A) 12/25/2016 1721   BILIRUBINUR NEGATIVE 12/25/2016 1721   KETONESUR NEGATIVE 12/25/2016 1721   PROTEINUR NEGATIVE 12/25/2016 1721   UROBILINOGEN 0.2 10/30/2014 0004   NITRITE NEGATIVE 12/25/2016 1721   LEUKOCYTESUR NEGATIVE 12/25/2016 1721    Sepsis Labs: Lactic Acid, Venous    Component Value Date/Time   LATICACIDVEN 2.6 (HH) 07/18/2018 0316    MICROBIOLOGY: Recent Results (from the past 240 hour(s))  Blood culture (routine x 2)     Status: None (Preliminary result)   Collection Time: 07/17/18  7:50 PM  Result Value Ref Range Status   Specimen Description BLOOD LEFT ANTECUBITAL  Final   Special Requests   Final    BOTTLES DRAWN AEROBIC AND ANAEROBIC Blood Culture adequate volume   Culture   Final    NO GROWTH 4 DAYS Performed at [facility], [address] [place], [place] [address]    Report Status PENDING  Incomplete  Blood culture (routine x 2)     Status: None (Preliminary result)   Collection Time: 07/17/18  7:57 PM  Result Value Ref Range Status   Specimen Description BLOOD LEFT HAND  Final   Special Requests   Final     BOTTLES DRAWN AEROBIC AND ANAEROBIC Blood Culture results may not be optimal due to an inadequate volume of blood received in culture bottles   Culture   Final    NO GROWTH 4 DAYS Performed at [facility], [address] [place], [place] [address]    Report Status PENDING  Incomplete  Surgical pcr screen     Status: Abnormal   Collection Time: 07/20/18 12:28 AM  Result Value Ref Range Status   MRSA, PCR NEGATIVE NEGATIVE Final   Staphylococcus aureus POSITIVE (A) NEGATIVE Final    Comment: (NOTE) The Xpert SA Assay (FDA approved for NASAL specimens in patients 35 years of age and older), is one component of a comprehensive surveillance program. It is not intended to diagnose infection nor to guide or monitor treatment. Performed at [facility], [address] [place], [place] [address]   Aerobic/Anaerobic Culture (surgical/deep wound)     Status: None (Preliminary result)   Collection Time: 07/20/18  2:37 PM  Result Value Ref Range Status   Specimen Description ABSCESS RIGHT LEG  Final   Special Requests NONE  Final   Gram Stain   Final    ABUNDANT WBC PRESENT, PREDOMINANTLY PMN NO ORGANISMS SEEN    Culture RARE STAPHYLOCOCCUS AUREUS  Final   Report Status PENDING  Incomplete   Organism ID, Bacteria STAPHYLOCOCCUS AUREUS  Final      Susceptibility   Staphylococcus aureus - MIC*    CIPROFLOXACIN <=0.5 SENSITIVE Sensitive     ERYTHROMYCIN <=0.25 SENSITIVE Sensitive     GENTAMICIN <=0.5 SENSITIVE Sensitive     OXACILLIN <=0.25 SENSITIVE Sensitive     TETRACYCLINE <=1 SENSITIVE Sensitive     VANCOMYCIN 1 SENSITIVE Sensitive     TRIMETH/SULFA <=10 SENSITIVE Sensitive     CLINDAMYCIN <=0.25 SENSITIVE Sensitive     RIFAMPIN <=0.5 SENSITIVE Sensitive     Inducible Clindamycin Value in next row Sensitive      NEGATIVEPerformed at [facility], [address], [place], [place] [address]    * RARE STAPHYLOCOCCUS AUREUS    RADIOLOGY STUDIES/RESULTS: Dg  Tibia/fibula Right  Result Date: 07/17/2018 CLINICAL DATA:  Cellulitis EXAM: RIGHT TIBIA AND FIBULA - 2 VIEW COMPARISON:  11/10/2014 FINDINGS: No skeletal abnormality. No  soft tissue mass or gas in the soft tissues. No foreign body. Mild calcification in the medial leg may represent dermal calcification and is unchanged from the prior study. IMPRESSION: No acute bony or soft tissue abnormality right lower leg Electronically Signed   By: Marlan Palauharles  Clark M.D.   On: 07/17/2018 19:22   Mr Tibia Fibula Right Wo Contrast  Result Date: 07/18/2018 CLINICAL DATA:  Right lower leg pain and swelling over the past 2 weeks which worsened over the past 2 days. The patient is diabetic. EXAM: MRI OF LOWER RIGHT EXTREMITY WITHOUT CONTRAST TECHNIQUE: Multiplanar, multisequence MR imaging of the right lower leg was performed. No intravenous contrast was administered. COMPARISON:  Plain films right lower leg 07/17/2018. FINDINGS: Bones/Joint/Cartilage Bone marrow signal is normal throughout without fracture, stress change or focal lesion. No evidence of osteomyelitis. Ligaments Negative. Muscles and Tendons Mildly increased T2 signal is seen in the distal aspect of the medial gastrocnemius and underlying medial periphery of the soleus. No intramuscular fluid collection. No tear. There is some fatty atrophy of lower leg musculature bilaterally which appears symmetric. Soft tissues Subcutaneous increased T2 signal in the lower legs bilaterally is much worse on the right. A fluid collection in the posterior, medial soft tissues of the right lower leg measures 0.8 cm transverse x 0.9 cm AP x 1.3 cm craniocaudal. A second small fluid collection more inferiorly in the subcutaneous tissues measures 0.8 cm craniocaudal by 0.6 cm transverse by 0.6 cm AP. A third tiny fluid collection in the subcutaneous tissues measuring 0.9 cm is identified. Finally, there is a fourth fluid collection in the subcutaneous tissues lateral to the Achilles  tendon which measures 0.6 cm in diameter. IMPRESSION: Extensive subcutaneous edema about the right lower leg is compatible with cellulitis. Mild edema in the inferior aspect of the medial gastrocnemius and lateral periphery of the underlying soleus could be due to myositis or related to denervation atrophy. Four subcutaneous fluid collections measuring 1.3 cm or less in diameter in the right lower leg as described above are worrisome for abscesses. Electronically Signed   By: Drusilla Kannerhomas  Dalessio M.D.   On: 07/18/2018 12:34   [place] Rt Lower Extrem Ltd Soft Tissue Non Vascular  Result Date: 07/17/2018 CLINICAL DATA:  Cellulitis EXAM: ULTRASOUND right LOWER EXTREMITY LIMITED TECHNIQUE: Ultrasound examination of the lower extremity soft tissues was performed in the area of clinical concern. COMPARISON:  Tib-fib x-rays from today FINDINGS: Scanning limited to the area of concern in the right lower leg soft tissues. There is subcutaneous edema. No fluid collection or mass lesion. Negative for abscess. Increased blood flow to the soft tissues on Doppler consistent with cellulitis IMPRESSION: Diffuse soft tissue swelling.  Negative for abscess right lower leg. Electronically Signed   By: Marlan Palauharles  Clark M.D.   On: 07/17/2018 19:21     LOS: 5 days   Jeoffrey MassedShanker Shantil Vallejo, MD  [facility]  If 7PM-7AM, please contact night-coverage  Please page via [email or web address] [email or web address]-click on MD name and type text message  07/22/2018, 12:02 PM

## 2018-07-22 NOTE — Progress Notes (Signed)
Physical Therapy Treatment Patient Details Name: James Roberson MRN: [id number] DOB: March 13, 1946 Today's Date: 07/22/2018    History of Present Illness Patient is a 72 y.o male admitted with swelling of RLE found to have abscess, now s/p I&D 12/13. PMH includes: lumbar laminectomy, HTN, HLD, DM,     PT Comments    Patient doing well with therapy today, ambulating unit without AD, and completing stair training without difficulty and good cary over of education for sequencing. PT to sign off at this time as all goals have been met.      Follow Up Recommendations  No PT follow up     Equipment Recommendations  None recommended by PT    Recommendations for Other Services       Precautions / Restrictions Restrictions Weight Bearing Restrictions: No    Mobility  Bed Mobility Overal bed mobility: Modified Independent                Transfers Overall transfer level: Modified independent                  Ambulation/Gait Ambulation/Gait assistance: Modified independent (Device/Increase time) Gait Distance (Feet): 200 Feet Assistive device: None Gait Pattern/deviations: Step-to pattern Gait velocity: decreased   General Gait Details: Pt ambulating with mild antalgaic gait, no overt LOB or need for assistance    Stairs Stairs: Yes Stairs assistance: Supervision Stair Management: One rail Left;One rail Right;Step to pattern Number of Stairs: 12 General stair comments: discussed proper sequencing for step to pattern with non operative leg leading going up, painful leg leading down. pt reports comfort with this and no balance issues noted.    Wheelchair Mobility    Modified Rankin (Stroke Patients Only)       Balance Overall balance assessment: No apparent balance deficits (not formally assessed)                                          Cognition Arousal/Alertness: Awake/alert Behavior During Therapy: WFL for tasks  assessed/performed Overall Cognitive Status: Within Functional Limits for tasks assessed                                        Exercises      General Comments        Pertinent Vitals/Pain Pain Assessment: Faces Pain Score: 5  Pain Descriptors / Indicators: Jabbing Pain Intervention(s): Limited activity within patient's tolerance;Monitored during session    Home Living                      Prior Function            PT Goals (current goals can now be found in the care plan section) Acute Rehab PT Goals Patient Stated Goal: go home when ready PT Goal Formulation: With patient Time For Goal Achievement: 08/04/18 Potential to Achieve Goals: Good Progress towards PT goals: Goals met/education completed, patient discharged from PT    Frequency    Min 3X/week      PT Plan Current plan remains appropriate    Co-evaluation              AM-PAC PT "6 Clicks" Mobility   Outcome Measure  Help needed turning from your back to your side while in a flat  bed without using bedrails?: None Help needed moving from lying on your back to sitting on the side of a flat bed without using bedrails?: None Help needed moving to and from a bed to a chair (including a wheelchair)?: None Help needed standing up from a chair using your arms (e.g., wheelchair or bedside chair)?: None Help needed to walk in hospital room?: None Help needed climbing 3-5 steps with a railing? : A Little 6 Click Score: 23    End of Session Equipment Utilized During Treatment: Gait belt Activity Tolerance: Patient tolerated treatment well Patient left: in bed;with call bell/phone within reach Nurse Communication: Mobility status PT Visit Diagnosis: Unsteadiness on feet (R26.81)     Time: 9806-9996 PT Time Calculation (min) (ACUTE ONLY): 20 min  Charges:  $Gait Training: 8-22 mins                     Reinaldo Berber, PT, DPT Acute Rehabilitation Services Pager:  [phone] Office: [phone]     Reinaldo Berber 07/22/2018, 9:44 AM

## 2018-07-23 LAB — BASIC METABOLIC PANEL
Anion gap: 12 (ref 5–15)
BUN: 9 mg/dL (ref 8–23)
CALCIUM: 8.8 mg/dL — AB (ref 8.9–10.3)
CO2: 23 mmol/L (ref 22–32)
Chloride: 105 mmol/L (ref 98–111)
Creatinine, Ser: 1.48 mg/dL — ABNORMAL HIGH (ref 0.61–1.24)
GFR calc Af Amer: 54 mL/min — ABNORMAL LOW (ref >60)
GFR calc non Af Amer: 47 mL/min — ABNORMAL LOW (ref >60)
Glucose, Bld: 174 mg/dL — ABNORMAL HIGH (ref 70–99)
Potassium: 4.1 mmol/L (ref 3.5–5.1)
Sodium: 140 mmol/L (ref 135–145)

## 2018-07-23 LAB — CBC
HCT: 34.7 % — ABNORMAL LOW (ref 39.0–52.0)
HEMOGLOBIN: 11 g/dL — AB (ref 13.0–17.0)
MCH: 27.5 pg (ref 26.0–34.0)
MCHC: 31.7 g/dL (ref 30.0–36.0)
MCV: 86.8 fL (ref 80.0–100.0)
Platelets: 330 10*3/uL (ref 150–400)
RBC: 4 MIL/uL — ABNORMAL LOW (ref 4.22–5.81)
RDW: 12.6 % (ref 11.5–15.5)
WBC: 6.8 10*3/uL (ref 4.0–10.5)
nRBC: 0 % (ref 0.0–0.2)

## 2018-07-23 LAB — GLUCOSE, CAPILLARY
GLUCOSE-CAPILLARY: 141 mg/dL — AB (ref 70–99)
Glucose-Capillary: 159 mg/dL — ABNORMAL HIGH (ref 70–99)
Glucose-Capillary: 139 mg/dL — ABNORMAL HIGH (ref 70–99)
Glucose-Capillary: 139 mg/dL — ABNORMAL HIGH (ref 70–99)
Glucose-Capillary: 131 mg/dL — ABNORMAL HIGH (ref 70–99)
Glucose-Capillary: 186 mg/dL — ABNORMAL HIGH (ref 70–99)

## 2018-07-23 LAB — PROTIME-INR
INR: 0.92
PROTHROMBIN TIME: 12.3 s (ref 11.4–15.2)

## 2018-07-23 NOTE — Progress Notes (Signed)
PROGRESS NOTE        PATIENT DETAILS Name: James Roberson Age: 72 y.o. Sex: male Date of Birth: [birth year] Admit Date: 07/17/2018 Admitting Physician Eduard Clos, MD ZOX:[id number], No Pcp Per  Brief Narrative: Patient is a 72 y.o. male with history of DM-2, hypertension, dyslipidemia-presenting with erythema, swelling of the right lower extremity.  MRI of the right lower extremity demonstrating small subcutaneous abscesses.  See below for further details  Subjective: Started having bleeding from his right lower extremity wound last night-apart from that no major complaints..  Assessment/Plan: Right lower extremity cellulitis with abscesses: Underwent I&D on 12/23-intraoperative cultures positive for MSSA-initially on IV vancomycin, but has been transitioned to oral Keflex.  Had some bleeding from his wound overnight-seen by orthopedics-recommendations are for compressive dressings and observation for another day.  Did speak with orthopedic MD-Dr. Roda Shutters this morning-recommendations are to discharge on oral antimicrobial therapy.  Per patient-in the past he is required IV vancomycin for his leg infection-I did explain to him that this infection was not felt to be very deep-and we have cultures that indicate Keflex is sensitive-I did explain that both orthopedic MD and and this MD felt oral antibiotics were reasonable in this setting.  Obviously if his wound worsens or he has systemic signs in spite of having I&D-then we could always rethink and place patient on IV antibiotics.  Hypertension: Controlled-continue lisinopril.  DM-2: CBGs stable with SSI-resume oral hypoglycemics on discharge.  Dyslipidemia: Continue statin  CKD stage III: Creatinine close to usual baseline-follow electrolytes periodically  Gout: Appears to be stable-no evidence of flare.  DVT Prophylaxis: Prophylactic Lovenox  Code Status: Full code  Family Communication: None at  bedside  Disposition Plan: Remain inpatient  Antimicrobial agents: Anti-infectives (From admission, onward)   Start     Dose/Rate Route Frequency Ordered Stop   07/22/18 1600  cephALEXin (KEFLEX) capsule 500 mg     500 mg Oral Every 8 hours 07/22/18 1536     07/18/18 2000  vancomycin (VANCOCIN) 1,500 mg in sodium chloride 0.9 % 500 mL IVPB  Status:  Discontinued     1,500 mg 250 mL/hr over 120 Minutes Intravenous Every 24 hours 07/17/18 2348 07/22/18 1536   07/18/18 0000  ceFEPIme (MAXIPIME) 2 g in sodium chloride 0.9 % 100 mL IVPB  Status:  Discontinued     2 g 200 mL/hr over 30 Minutes Intravenous Every 24 hours 07/17/18 2348 07/18/18 1604   07/17/18 2315  ceFEPIme (MAXIPIME) 2 g in sodium chloride 0.9 % 100 mL IVPB     2 g 200 mL/hr over 30 Minutes Intravenous  Once 07/17/18 2313 07/18/18 0038   07/17/18 2000  vancomycin (VANCOCIN) 2,000 mg in sodium chloride 0.9 % 500 mL IVPB     2,000 mg 250 mL/hr over 120 Minutes Intravenous  Once 07/17/18 1840 07/17/18 2223   07/17/18 1900  piperacillin-tazobactam (ZOSYN) IVPB 3.375 g     3.375 g 100 mL/hr over 30 Minutes Intravenous  Once 07/17/18 1840 07/17/18 2129      Procedures: None  CONSULTS:  orthopedic surgery  Time spent: 25- minutes-Greater than 50% of this time was spent in counseling, explanation of diagnosis, planning of further management, and coordination of care.  MEDICATIONS: Scheduled Meds: . atorvastatin  40 mg Oral QHS  . buPROPion  150 mg Oral Daily  . cephALEXin  500 mg Oral Q8H  . docusate sodium  100 mg Oral BID  . enoxaparin (LOVENOX) injection  40 mg Subcutaneous Q24H  . gabapentin  600 mg Oral QHS  . insulin aspart  0-9 Units Subcutaneous TID WC  . lisinopril  10 mg Oral QHS  . mupirocin ointment  1 application Nasal BID  . vitamin B-12  500 mcg Oral QODAY   Continuous Infusions: . sodium chloride Stopped (07/20/18 2309)  . lactated ringers 10 mL/hr at 07/20/18 1323  . methocarbamol (ROBAXIN) IV      PRN Meds:.acetaminophen, diphenhydrAMINE, hydrALAZINE, HYDROcodone-acetaminophen, HYDROcodone-acetaminophen, magnesium citrate, methocarbamol **OR** methocarbamol (ROBAXIN) IV, metoCLOPramide **OR** metoCLOPramide (REGLAN) injection, morphine injection, ondansetron **OR** ondansetron (ZOFRAN) IV, polyethylene glycol, sorbitol   PHYSICAL EXAM: Vital signs: Vitals:   07/22/18 1758 07/22/18 2215 07/23/18 0624 07/23/18 0812  BP: 138/70 (!) 179/93 (!) 159/84 (!) 162/84  Pulse: 82 83  90  Resp:  16    Temp: 99.7 F (37.6 C) 98.7 F (37.1 C)  98.1 F (36.7 C)  TempSrc:  Oral  Oral  SpO2: 98% 98%  100%  Weight:      Height:       Filed Weights   07/17/18 2300  Weight: 87.7 kg   Body mass index is 28.55 kg/m.   General appearance:Awake, alert, not in any distress.  Eyes:no scleral icterus. HEENT: Atraumatic and Normocephalic Neck: supple, no JVD. Resp:Good air entry bilaterally,no rales or rhonchi CVS: S1 S2 regular, no murmurs.  GI: Bowel sounds present, Non tender and not distended with no gaurding, rigidity or rebound. Extremities: B/L Lower Ext shows no edema, both legs are warm to touch-dressing was just changed by orthopedics-hence did not open. Neurology:  Non focal Psychiatric: Normal judgment and insight. Normal mood. Musculoskeletal:No digital cyanosis Skin:No Rash, warm and dry Wounds:N/A I have personally reviewed following labs and imaging studies  LABORATORY DATA: CBC: Recent Labs  Lab 07/17/18 1621 07/18/18 0124 07/19/18 0338 07/20/18 0249 07/23/18 0744  WBC 8.8 9.2 9.0 9.8 6.8  NEUTROABS 5.9  --   --   --   --   HGB 14.0 12.0* 12.1* 11.6* 11.0*  HCT 45.8 38.0* 38.4* 37.5* 34.7*  MCV 88.4 86.6 87.5 86.4 86.8  PLT 348 277 270 294 330    Basic Metabolic Panel: Recent Labs  Lab 07/19/18 0338 07/20/18 0249 07/21/18 0703 07/22/18 0252 07/23/18 0744  NA 139 138 139 138 140  K 3.8 4.0 3.8 3.9 4.1  CL 107 104 103 105 105  CO2 19* 23 27 21* 23   GLUCOSE 114* 211* 159* 188* 174*  BUN 11 9 10 11 9   CREATININE 1.45* 1.57* 1.52* 1.55* 1.48*  CALCIUM 8.8* 9.0 8.4* 8.4* 8.8*    GFR: Estimated Creatinine Clearance: 49.5 mL/min (A) (by C-G formula based on SCr of 1.48 mg/dL (H)).  Liver Function Tests: Recent Labs  Lab 07/17/18 1621 07/18/18 0124  AST 24 18  ALT 22 21  ALKPHOS 62 48  BILITOT 0.7 0.8  PROT 6.7 5.9*  ALBUMIN 3.8 3.2*   No results for input(s): LIPASE, AMYLASE in the last 168 hours. No results for input(s): AMMONIA in the last 168 hours.  Coagulation Profile: Recent Labs  Lab 07/23/18 0744  INR 0.92    Cardiac Enzymes: No results for input(s): CKTOTAL, CKMB, CKMBINDEX, TROPONINI in the last 168 hours.  BNP (last 3 results) No results for input(s): PROBNP in the last 8760 hours.  HbA1C: No results for input(s): HGBA1C in the last  72 hours.  CBG: Recent Labs  Lab 07/22/18 0748 07/22/18 1151 07/22/18 1708 07/22/18 2054 07/23/18 0924  GLUCAP 126* 181* 159* 139* 139*    Lipid Profile: No results for input(s): CHOL, HDL, LDLCALC, TRIG, CHOLHDL, LDLDIRECT in the last 72 hours.  Thyroid Function Tests: No results for input(s): TSH, T4TOTAL, FREET4, T3FREE, THYROIDAB in the last 72 hours.  Anemia Panel: No results for input(s): VITAMINB12, FOLATE, FERRITIN, TIBC, IRON, RETICCTPCT in the last 72 hours.  Urine analysis:    Component Value Date/Time   COLORURINE YELLOW 12/25/2016 1721   APPEARANCEUR CLEAR 12/25/2016 1721   LABSPEC 1.013 12/25/2016 1721   PHURINE 5.0 12/25/2016 1721   GLUCOSEU 50 (A) 12/25/2016 1721   HGBUR MODERATE (A) 12/25/2016 1721   BILIRUBINUR NEGATIVE 12/25/2016 1721   KETONESUR NEGATIVE 12/25/2016 1721   PROTEINUR NEGATIVE 12/25/2016 1721   UROBILINOGEN 0.2 10/30/2014 0004   NITRITE NEGATIVE 12/25/2016 1721   LEUKOCYTESUR NEGATIVE 12/25/2016 1721    Sepsis Labs: Lactic Acid, Venous    Component Value Date/Time   LATICACIDVEN 2.6 (HH) 07/18/2018 0316     MICROBIOLOGY: Recent Results (from the past 240 hour(s))  Blood culture (routine x 2)     Status: None (Preliminary result)   Collection Time: 07/17/18  7:50 PM  Result Value Ref Range Status   Specimen Description BLOOD LEFT ANTECUBITAL  Final   Special Requests   Final    BOTTLES DRAWN AEROBIC AND ANAEROBIC Blood Culture adequate volume   Culture   Final    NO GROWTH 4 DAYS Performed at [facility], [address], [place], [place] [address]    Report Status PENDING  Incomplete  Blood culture (routine x 2)     Status: None (Preliminary result)   Collection Time: 07/17/18  7:57 PM  Result Value Ref Range Status   Specimen Description BLOOD LEFT HAND  Final   Special Requests   Final    BOTTLES DRAWN AEROBIC AND ANAEROBIC Blood Culture results may not be optimal due to an inadequate volume of blood received in culture bottles   Culture   Final    NO GROWTH 4 DAYS Performed at [facility], [address] [place], [place] [address]    Report Status PENDING  Incomplete  Surgical pcr screen     Status: Abnormal   Collection Time: 07/20/18 12:28 AM  Result Value Ref Range Status   MRSA, PCR NEGATIVE NEGATIVE Final   Staphylococcus aureus POSITIVE (A) NEGATIVE Final    Comment: (NOTE) The Xpert SA Assay (FDA approved for NASAL specimens in patients 74 years of age and older), is one component of a comprehensive surveillance program. It is not intended to diagnose infection nor to guide or monitor treatment. Performed at [facility], [address], [place], [place] [address]   Aerobic/Anaerobic Culture (surgical/deep wound)     Status: None (Preliminary result)   Collection Time: 07/20/18  2:37 PM  Result Value Ref Range Status   Specimen Description ABSCESS RIGHT LEG  Final   Special Requests NONE  Final   Gram Stain   Final    ABUNDANT WBC PRESENT, PREDOMINANTLY PMN NO ORGANISMS SEEN Performed at [facility], [address],  [place], [place] [address]    Culture   Final    RARE STAPHYLOCOCCUS AUREUS NO ANAEROBES ISOLATED; CULTURE IN PROGRESS FOR 5 DAYS    Report Status PENDING  Incomplete   Organism ID, Bacteria STAPHYLOCOCCUS AUREUS  Final  Susceptibility   Staphylococcus aureus - MIC*    CIPROFLOXACIN <=0.5 SENSITIVE Sensitive     ERYTHROMYCIN <=0.25 SENSITIVE Sensitive     GENTAMICIN <=0.5 SENSITIVE Sensitive     OXACILLIN <=0.25 SENSITIVE Sensitive     TETRACYCLINE <=1 SENSITIVE Sensitive     VANCOMYCIN 1 SENSITIVE Sensitive     TRIMETH/SULFA <=10 SENSITIVE Sensitive     CLINDAMYCIN <=0.25 SENSITIVE Sensitive     RIFAMPIN <=0.5 SENSITIVE Sensitive     Inducible Clindamycin NEGATIVE Sensitive     * RARE STAPHYLOCOCCUS AUREUS    RADIOLOGY STUDIES/RESULTS: Dg Tibia/fibula Right  Result Date: 07/17/2018 CLINICAL DATA:  Cellulitis EXAM: RIGHT TIBIA AND FIBULA - 2 VIEW COMPARISON:  11/10/2014 FINDINGS: No skeletal abnormality. No soft tissue mass or gas in the soft tissues. No foreign body. Mild calcification in the medial leg may represent dermal calcification and is unchanged from the prior study. IMPRESSION: No acute bony or soft tissue abnormality right lower leg Electronically Signed   By: Marlan Palau M.D.   On: 07/17/2018 19:22   Mr Tibia Fibula Right Wo Contrast  Result Date: 07/18/2018 CLINICAL DATA:  Right lower leg pain and swelling over the past 2 weeks which worsened over the past 2 days. The patient is diabetic. EXAM: MRI OF LOWER RIGHT EXTREMITY WITHOUT CONTRAST TECHNIQUE: Multiplanar, multisequence MR imaging of the right lower leg was performed. No intravenous contrast was administered. COMPARISON:  Plain films right lower leg 07/17/2018. FINDINGS: Bones/Joint/Cartilage Bone marrow signal is normal throughout without fracture, stress change or focal lesion. No evidence of osteomyelitis. Ligaments Negative. Muscles and Tendons Mildly increased T2 signal is seen in the distal aspect of the  medial gastrocnemius and underlying medial periphery of the soleus. No intramuscular fluid collection. No tear. There is some fatty atrophy of lower leg musculature bilaterally which appears symmetric. Soft tissues Subcutaneous increased T2 signal in the lower legs bilaterally is much worse on the right. A fluid collection in the posterior, medial soft tissues of the right lower leg measures 0.8 cm transverse x 0.9 cm AP x 1.3 cm craniocaudal. A second small fluid collection more inferiorly in the subcutaneous tissues measures 0.8 cm craniocaudal by 0.6 cm transverse by 0.6 cm AP. A third tiny fluid collection in the subcutaneous tissues measuring 0.9 cm is identified. Finally, there is a fourth fluid collection in the subcutaneous tissues lateral to the Achilles tendon which measures 0.6 cm in diameter. IMPRESSION: Extensive subcutaneous edema about the right lower leg is compatible with cellulitis. Mild edema in the inferior aspect of the medial gastrocnemius and lateral periphery of the underlying soleus could be due to myositis or related to denervation atrophy. Four subcutaneous fluid collections measuring 1.3 cm or less in diameter in the right lower leg as described above are worrisome for abscesses. Electronically Signed   By: Drusilla Kanner M.D.   On: 07/18/2018 12:34   [place] Rt Lower Extrem Ltd Soft Tissue Non Vascular  Result Date: 07/17/2018 CLINICAL DATA:  Cellulitis EXAM: ULTRASOUND right LOWER EXTREMITY LIMITED TECHNIQUE: Ultrasound examination of the lower extremity soft tissues was performed in the area of clinical concern. COMPARISON:  Tib-fib x-rays from today FINDINGS: Scanning limited to the area of concern in the right lower leg soft tissues. There is subcutaneous edema. No fluid collection or mass lesion. Negative for abscess. Increased blood flow to the soft tissues on Doppler consistent with cellulitis IMPRESSION: Diffuse soft tissue swelling.  Negative for abscess right lower leg.  Electronically Signed   By:  Marlan Palau M.D.   On: 07/17/2018 19:21     LOS: 6 days   Jeoffrey Massed, MD  [facility]  If 7PM-7AM, please contact night-coverage  Please page via [email or web address]-Password [email or web address]-click on MD name and type text message  07/23/2018, 12:06 PM

## 2018-07-23 NOTE — Progress Notes (Signed)
I checked on the dressing this morning and everything appears to be hemostatic.  The dressing is c/d/i.  The bleeding likely resulted from a clot breaking free during dressing change since the wounds were hemostatic when he left the OR.  And the bleeding didn't start until 2 days after surgery.  I think it's best to continue with the pressure dressing as well as stopping the lovenox.  Continue elevation.  Continue with wet to dry dressing changes with normal saline.  Probably best to keep him here until tomorrow to make sure the surgical site is hemostatic before discharge.  Patient in agreement with this plan.    Mayra ReelN. Michael Xu, MD [facility] [phone] 9:52 AM

## 2018-07-23 NOTE — Progress Notes (Signed)
0130- Patient stated dressing change was to be at this per previous shift nurse. Dressing changed to RLE per request. Bleeding noted at this time. 0300- Patient called out dressing continue to bleed. New pressure dressing applied.  Paged Bodenheimer NP. Instructed to call orthopedics. Called Dr. Cleophas DunkerWhitfield. Instructions given to use surgicel and apply abd pads, kerlix and ace wrap. Instructions carried out. 0500- No bleeding through dressing noted at this time. Patient resting.

## 2018-07-23 NOTE — Progress Notes (Signed)
Patient called out. Incision started to bleed again. Ace wrap removed. Keep old dressing reinforced with more abd pads and kerlix and ace wrap reapplied. MD paged.

## 2018-07-24 DIAGNOSIS — Z87891 Personal history of nicotine dependence: Secondary | ICD-10-CM

## 2018-07-24 DIAGNOSIS — E119 Type 2 diabetes mellitus without complications: Secondary | ICD-10-CM

## 2018-07-24 DIAGNOSIS — L02415 Cutaneous abscess of right lower limb: Principal | ICD-10-CM

## 2018-07-24 DIAGNOSIS — Z872 Personal history of diseases of the skin and subcutaneous tissue: Secondary | ICD-10-CM

## 2018-07-24 DIAGNOSIS — I878 Other specified disorders of veins: Secondary | ICD-10-CM

## 2018-07-24 DIAGNOSIS — Z888 Allergy status to other drugs, medicaments and biological substances status: Secondary | ICD-10-CM

## 2018-07-24 DIAGNOSIS — B9561 Methicillin susceptible Staphylococcus aureus infection as the cause of diseases classified elsewhere: Secondary | ICD-10-CM

## 2018-07-24 DIAGNOSIS — R609 Edema, unspecified: Secondary | ICD-10-CM

## 2018-07-24 DIAGNOSIS — Z91048 Other nonmedicinal substance allergy status: Secondary | ICD-10-CM

## 2018-07-24 DIAGNOSIS — Z8619 Personal history of other infectious and parasitic diseases: Secondary | ICD-10-CM

## 2018-07-24 DIAGNOSIS — Z881 Allergy status to other antibiotic agents status: Secondary | ICD-10-CM

## 2018-07-24 LAB — GLUCOSE, CAPILLARY
Glucose-Capillary: 141 mg/dL — ABNORMAL HIGH (ref 70–99)
Glucose-Capillary: 168 mg/dL — ABNORMAL HIGH (ref 70–99)
Glucose-Capillary: 223 mg/dL — ABNORMAL HIGH (ref 70–99)

## 2018-07-24 LAB — CULTURE, BLOOD (ROUTINE X 2)
CULTURE: NO GROWTH
Culture: NO GROWTH
Special Requests: ADEQUATE

## 2018-07-24 LAB — CREATININE, SERUM
Creatinine, Ser: 1.4 mg/dL — ABNORMAL HIGH (ref 0.61–1.24)
GFR calc Af Amer: 58 mL/min — ABNORMAL LOW (ref >60)
GFR calc non Af Amer: 50 mL/min — ABNORMAL LOW (ref >60)

## 2018-07-24 MED ORDER — CEPHALEXIN 500 MG PO CAPS: 500.0000 mg | ORAL_CAPSULE | Freq: Three times a day (TID) | ORAL | 0 refills | Status: DC

## 2018-07-24 MED ORDER — OXYCODONE-ACETAMINOPHEN 5-325 MG PO TABS: 1.0000 | ORAL_TABLET | Freq: Four times a day (QID) | ORAL | 0 refills | Status: DC | PRN

## 2018-07-24 NOTE — Progress Notes (Signed)
Patient requested dressing be changed though nurse concerned that it may trigger bleeding. Patient wanted it chnged and it continues to bleed after changing it twice. Will continue to monitor.

## 2018-07-24 NOTE — Progress Notes (Signed)
Subjective: 4 Days Post-Op Procedure(s) (LRB): IRRIGATION AND DEBRIDEMENT  OF LEG (Right) Patient reports pain as mild.  Dressing became saturated with blood overnight once compression bandage removed.  Otherwise, no drainage from yesterday am until then. Has been off of lovenox, limiting activity and elevating for swelling.  Main concern is from wife and her ability to apply bandages.  Objective: Vital signs in last 24 hours: Temp:  [97.8 F (36.6 C)-98.1 F (36.7 C)] 98 F (36.7 C) (12/16 2147) Pulse Rate:  [78-90] 78 (12/16 2147) BP: (145-162)/(77-84) 160/79 (12/16 2147) SpO2:  [96 %-100 %] 96 % (12/16 2147)  Intake/Output from previous day: 12/16 0701 - 12/17 0700 In: -  Out: 700 [Urine:700] Intake/Output this shift: No intake/output data recorded.  Recent Labs    07/23/18 0744  HGB 11.0*   Recent Labs    07/23/18 0744  WBC 6.8  RBC 4.00*  HCT 34.7*  PLT 330   Recent Labs    07/22/18 0252 07/23/18 0744 07/24/18 0251  NA 138 140  --   K 3.9 4.1  --   CL 105 105  --   CO2 21* 23  --   BUN 11 9  --   CREATININE 1.55* 1.48* 1.40*  GLUCOSE 188* 174*  --   CALCIUM 8.4* 8.8*  --    Recent Labs    07/23/18 0744  INR 0.92    Neurologically intact Neurovascular intact Sensation intact distally Intact pulses distally Dorsiflexion/Plantar flexion intact Compartment soft  Dressing removed by me this am.  No active bleeding/oozing from ANY of the wounds.  Wound repacked by me and compression bandage applied.      Assessment/Plan: 4 Days Post-Op Procedure(s) (LRB): IRRIGATION AND DEBRIDEMENT  OF LEG (Right)  WBAT RLE Instructed nurse on wound care who will teach wife/daughter this afternoon prior to d/c Will be d/c on po keflex F/u with Dr. Roda ShuttersXu in one week for wound check     Cristie HemMary L Boston Catarino 07/24/2018, 8:10 AM

## 2018-07-24 NOTE — Consult Note (Signed)
[facility]    Date of Admission:  07/17/2018     Total days of antibiotics 8               Reason for Consult: Abscess  Referring Provider: Ghimire Primary Care Provider: Patient, No Pcp Per   Assessment/Plan:  Mr. James Roberson is a 72 year old male with history of diabetes and right lower extremity edema and cellulitis presenting with worsening pain and edema and found to have cellulitis and 5 MSSA abscess which were surgically incised and drained. He is clinically stable and remained afebrile without leukocytosis. These are superficial with no bone involvement. In collaboration with our antibiotic stewardship pharmacist we discussed that Keflex or Ancef are the optimal agents for treating MSSA infections. We discussed the need for compression to reduce the risk of lower extremity edema to help prevent this in the future. 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   1. Agree with choice of Keflex for MSSA infection. Would recommend stop date of 12/23. 2. Continued control of diabetes and compression of lower leg as needed.    Principal Problem:   Abscess of right lower leg Active Problems:   GOUT   Essential hypertension   Cellulitis   CKD (chronic kidney disease) stage 3, GFR 30-59 ml/min (HCC)   Cellulitis of right lower extremity   Diabetes mellitus type 2 in obese (HCC)   . atorvastatin  40 mg Oral QHS  . buPROPion  150 mg Oral Daily  . cephALEXin  500 mg Oral Q8H  . docusate sodium  100 mg Oral BID  . enoxaparin (LOVENOX) injection  40 mg Subcutaneous Q24H  . gabapentin  600 mg Oral QHS  . insulin aspart  0-9 Units Subcutaneous TID WC  . lisinopril  10 mg Oral QHS  . mupirocin ointment  1 application Nasal BID  . vitamin B-12  500 mcg Oral QODAY     HPI: James Roberson is a 72 y.o. male with previous medical history relevant for diabetes who was evaluated and  admitted with a chief complaint of worsening pain and swelling of the right lower extremity within the last couple of weeks prior to presentation and acutely worsening 2 days prior to presentation.  He has history of recurrent cellulitis and has required surgeries previously.   In the ED he was afebrile with stable vital signs.  Initial white blood cell count of 8.8. US of the right lower extremity with soft tissue swelling and negative for abscess. X-rays with no acute bony or soft tissue abnormalities. MRI with extensive subcutaneous edema compatible with cellulitis.with potential for myositis around the medial gastrocnemius. There were 4 subcutaneous collections measuring 1.3 cm or less in the right lower leg worrisome for abscesses.   James Roberson underwent irrigation and debridement of 5 separate subcutaneous abscesses on 12/13. Frank pus was encountered with each abscess. Cultures were positive for MSSA.    James Roberson has been afebrile since admission without leukocytosis. Currently on Day 8 antimicrobial therapy with the current regimen of Keflex. He previously received vancomycin, piperacillin-tazobactam, and cefepime. Describes that he has had this waxing and waning for about 15 years now. 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     Review of Systems: Review of Systems  Constitutional: Negative for chills and fever.  Respiratory: Negative for cough, shortness of breath and wheezing.  Cardiovascular: Negative for chest pain, palpitations and leg swelling.  Gastrointestinal: Negative for abdominal pain, constipation, diarrhea, nausea and vomiting.  Skin: Positive for rash.     Past Medical History:  Diagnosis Date  . Diabetes mellitus without complication (HCC)   . Hyperlipidemia   . Hypertension   . Obesity     Social History   Tobacco Use  . Smoking status: Former Smoker    Last attempt to quit: 07/23/1966    Years since  quitting: 52.0  . Smokeless tobacco: Never Used  Substance Use Topics  . Alcohol use: Yes    Comment: 1 a quarter  . Drug use: Not on file    Family History  Problem Relation Age of Onset  . CAD Father     Allergies  Allergen Reactions  . Avelox Abc [Moxifloxacin] Anaphylaxis  . Tape Rash    PAPER TAPE ONLY!!  . Ambien Cr [Zolpidem Tartrate Er] Other (See Comments)     Hallucinations  . Zolpidem Other (See Comments)     Hallucinations - name brand only - tolerates generic   . Other Rash    Nuderm and Coban     OBJECTIVE: Blood pressure (!) 151/82, pulse 81, temperature 97.9 F (36.6 C), resp. rate 18, height 5\' 9"  (1.753 m), weight 87.7 kg, SpO2 97 %.  Physical Exam Constitutional:      General: He is not in acute distress.    Appearance: He is well-developed and well-nourished.  Cardiovascular:     Rate and Rhythm: Normal rate and regular rhythm.     Pulses: Intact distal pulses.     Heart sounds: Normal heart sounds.  Pulmonary:     Effort: Pulmonary effort is normal.     Breath sounds: Normal breath sounds.  Musculoskeletal:     Comments: Surgical wrap located on the right lower extremity that is clean and dry. Sensation is intact and appropriate.   Skin:    General: Skin is warm and dry.  Neurological:     Mental Status: He is alert and oriented to person, place, and time.  Psychiatric:        Mood and Affect: Mood and affect normal.        Behavior: Behavior normal.        Thought Content: Thought content normal.        Judgment: Judgment normal.     Lab Results Lab Results  Component Value Date   WBC 6.8 07/23/2018   HGB 11.0 (L) 07/23/2018   HCT 34.7 (L) 07/23/2018   MCV 86.8 07/23/2018   PLT 330 07/23/2018    Lab Results  Component Value Date   CREATININE 1.40 (H) 07/24/2018   BUN 9 07/23/2018   NA 140 07/23/2018   K 4.1 07/23/2018   CL 105 07/23/2018   CO2 23 07/23/2018    Lab Results  Component Value Date   ALT 21 07/18/2018    AST 18 07/18/2018   ALKPHOS 48 07/18/2018   BILITOT 0.8 07/18/2018     Microbiology: Recent Results (from the past 240 hour(s))  Blood culture (routine x 2)     Status: None (Preliminary result)   Collection Time: 07/17/18  7:50 PM  Result Value Ref Range Status   Specimen Description BLOOD LEFT ANTECUBITAL  Final   Special Requests   Final    BOTTLES DRAWN AEROBIC AND ANAEROBIC Blood Culture adequate volume   Culture   Final    NO GROWTH 4 DAYS Performed at [facility], [address], [place], [place] [address]    Report Status PENDING  Incomplete  Blood culture (routine x 2)     Status: None (Preliminary result)   Collection Time: 07/17/18  7:57 PM  Result Value Ref Range Status   Specimen Description BLOOD LEFT HAND  Final   Special Requests   Final    BOTTLES DRAWN AEROBIC AND ANAEROBIC Blood Culture results may not be optimal due to an inadequate volume of blood received in culture bottles   Culture   Final    NO GROWTH 4 DAYS Performed at [facility], [address] [place], [place] [address]    Report Status PENDING  Incomplete  Surgical pcr screen     Status: Abnormal   Collection Time: 07/20/18 12:28 AM  Result Value Ref Range Status   MRSA, PCR NEGATIVE NEGATIVE Final   Staphylococcus aureus POSITIVE (A) NEGATIVE Final    Comment: (NOTE) The Xpert SA Assay (FDA approved for NASAL specimens in patients 32 years of age and older), is one component of a comprehensive surveillance program. It is not intended to diagnose infection nor to guide or monitor treatment. Performed at [facility], [address] [place], [place] [address]   Aerobic/Anaerobic Culture (surgical/deep wound)     Status: None (Preliminary result)   Collection Time: 07/20/18  2:37 PM  Result Value Ref Range Status   Specimen Description ABSCESS RIGHT LEG  Final   Special Requests NONE  Final   Gram Stain   Final    ABUNDANT WBC PRESENT, PREDOMINANTLY PMN NO ORGANISMS  SEEN Performed at [facility], [address], [place], [place] [address]    Culture   Final    RARE STAPHYLOCOCCUS AUREUS NO ANAEROBES ISOLATED; CULTURE IN PROGRESS FOR 5 DAYS    Report Status PENDING  Incomplete   Organism ID, Bacteria STAPHYLOCOCCUS AUREUS  Final      Susceptibility   Staphylococcus aureus - MIC*    CIPROFLOXACIN <=0.5 SENSITIVE Sensitive     ERYTHROMYCIN <=0.25 SENSITIVE Sensitive     GENTAMICIN <=0.5 SENSITIVE Sensitive     OXACILLIN <=0.25 SENSITIVE Sensitive     TETRACYCLINE <=1 SENSITIVE Sensitive     VANCOMYCIN 1 SENSITIVE Sensitive     TRIMETH/SULFA <=10 SENSITIVE Sensitive     CLINDAMYCIN <=0.25 SENSITIVE Sensitive     RIFAMPIN <=0.5 SENSITIVE Sensitive     Inducible Clindamycin NEGATIVE Sensitive     * RARE STAPHYLOCOCCUS AUREUS     Marcos Eke, NP [facility] [phone] Pager  07/24/2018  10:46 AM

## 2018-07-24 NOTE — Progress Notes (Signed)
Pt seen by MD, orders written for discharge. RN went over discharge instructions with pt and answered all questions. RN removed IV's. RN taught pt's wife and daughter how to complete wound care to pt's RLE. They verbalized understanding and completed teach back to RN. Escorted for discharge via wheelchair with all belongings. Pt will follow up outpatient with MD.

## 2018-07-24 NOTE — Progress Notes (Signed)
Patient dressing changed on the right leg. Few minutes after the dressing saturated with blood. Dressing reinforced.

## 2018-07-24 NOTE — Progress Notes (Signed)
PROGRESS NOTE        PATIENT DETAILS Name: James Roberson Age: 72 y.o. Sex: male Date of Birth: Mar 28, 1946 Admit Date: 07/17/2018 Admitting Physician Eduard Clos, MD [id number], No Pcp Per  Brief Narrative: Patient is a 72 y.o. male with history of DM-2, hypertension, dyslipidemia-presenting with erythema, swelling of the right lower extremity.  MRI of the right lower extremity demonstrating small subcutaneous abscesses.  See below for further details  Subjective: Dressing got soaked with blood last night-requiring dressing changes x2.  Seen by orthopedics morning-no obvious bleeding at all.  Patient very anxious there is willing/daughter will not be able to manage his wounds at home.  Still inquiring whether Keflex is the right antimicrobial therapy to go home with-he also brought this with Dr. Roda Shutters yesterday-claims he has been on IV vancomycin in the past, and is concerned that Keflex may not work well.   Assessment/Plan: Right lower extremity cellulitis with abscesses: Underwent I&D on 12/23-intraoperative cultures positive for MSSA.  Initially on IV vancomycin, but now has been transition to Keflex.  Spoke with orthopedics-Dr Xu-wound looks good-with no signs of residual infection.  Abscesses were superficial and small-he does not think patient requires IV antimicrobial therapy on discharge-and I fully concur.  Reassurance has been provided over the past few days-however this morning-patient still asking if Keflex is the right choice.  Have asked ID to provide a second opinion.    Did have some mild soakage of his dressing overnight, reevaluated by orthopedics this morning with no evidence of bleeding.  Nursing staff will provide wound care education to the patient's family later today-if they feel comfortable-he will then be discharged home on oral Keflex for a few more days.  Hypertension: Controlled-continue lisinopril.  DM-2: CBG stable with  SSI-resume oral hypoglycemics on discharge.  Dyslipidemia: Continue statin.  CKD stage III: Creatinine close to usual baseline-follow electrolytes periodically  Gout: Appears to be stable-no evidence of flare.  DVT Prophylaxis: Prophylactic Lovenox  Code Status: Full code  Family Communication: None at bedside  Disposition Plan: Remain inpatient-Home later today or tomorrow morning once family is comfortable with dressing changes.  Antimicrobial agents: Anti-infectives (From admission, onward)   Start     Dose/Rate Route Frequency Ordered Stop   07/22/18 1600  cephALEXin (KEFLEX) capsule 500 mg     500 mg Oral Every 8 hours 07/22/18 1536     07/18/18 2000  vancomycin (VANCOCIN) 1,500 mg in sodium chloride 0.9 % 500 mL IVPB  Status:  Discontinued     1,500 mg 250 mL/hr over 120 Minutes Intravenous Every 24 hours 07/17/18 2348 07/22/18 1536   07/18/18 0000  ceFEPIme (MAXIPIME) 2 g in sodium chloride 0.9 % 100 mL IVPB  Status:  Discontinued     2 g 200 mL/hr over 30 Minutes Intravenous Every 24 hours 07/17/18 2348 07/18/18 1604   07/17/18 2315  ceFEPIme (MAXIPIME) 2 g in sodium chloride 0.9 % 100 mL IVPB     2 g 200 mL/hr over 30 Minutes Intravenous  Once 07/17/18 2313 07/18/18 0038   07/17/18 2000  vancomycin (VANCOCIN) 2,000 mg in sodium chloride 0.9 % 500 mL IVPB     2,000 mg 250 mL/hr over 120 Minutes Intravenous  Once 07/17/18 1840 07/17/18 2223   07/17/18 1900  piperacillin-tazobactam (ZOSYN) IVPB 3.375 g     3.375 g 100  mL/hr over 30 Minutes Intravenous  Once 07/17/18 1840 07/17/18 2129      Procedures: None  CONSULTS:  orthopedic surgery  Time spent: 25- minutes-Greater than 50% of this time was spent in counseling, explanation of diagnosis, planning of further management, and coordination of care.  MEDICATIONS: Scheduled Meds: . atorvastatin  40 mg Oral QHS  . buPROPion  150 mg Oral Daily  . cephALEXin  500 mg Oral Q8H  . docusate sodium  100 mg Oral BID   . enoxaparin (LOVENOX) injection  40 mg Subcutaneous Q24H  . gabapentin  600 mg Oral QHS  . insulin aspart  0-9 Units Subcutaneous TID WC  . lisinopril  10 mg Oral QHS  . mupirocin ointment  1 application Nasal BID  . vitamin B-12  500 mcg Oral QODAY   Continuous Infusions: . sodium chloride Stopped (07/20/18 2309)  . lactated ringers 10 mL/hr at 07/20/18 1323  . methocarbamol (ROBAXIN) IV     PRN Meds:.acetaminophen, diphenhydrAMINE, hydrALAZINE, HYDROcodone-acetaminophen, HYDROcodone-acetaminophen, magnesium citrate, methocarbamol **OR** methocarbamol (ROBAXIN) IV, metoCLOPramide **OR** metoCLOPramide (REGLAN) injection, morphine injection, ondansetron **OR** ondansetron (ZOFRAN) IV, polyethylene glycol, sorbitol   PHYSICAL EXAM: Vital signs: Vitals:   07/23/18 0812 07/23/18 1700 07/23/18 2147 07/24/18 0829  BP: (!) 162/84 (!) 145/77 (!) 160/79 (!) 151/82  Pulse: 90 80 78 81  Resp:    18  Temp: 98.1 F (36.7 C) 97.8 F (36.6 C) 98 F (36.7 C) 97.9 F (36.6 C)  TempSrc: Oral Oral Axillary   SpO2: 100% 96% 96% 97%  Weight:      Height:       Filed Weights   07/17/18 2300  Weight: 87.7 kg   Body mass index is 28.55 kg/m.   General appearance:Awake, alert, not in any distress.  Eyes:no scleral icterus. HEENT: Atraumatic and Normocephalic Neck: supple, no JVD. Resp:Good air entry bilaterally,no rales or rhonchi CVS: S1 S2 regular, no murmurs.  GI: Bowel sounds present, Non tender and not distended with no gaurding, rigidity or rebound. Extremities: B/L Lower Ext shows no edema, both legs are warm to touch.  Dressing in place-mild soakage is seen (my exam was before orthopedic evaluation this morning) Neurology:  Non focal Psychiatric: Normal judgment and insight. Normal mood. Musculoskeletal:No digital cyanosis Skin:No Rash, warm and dry Wounds:N/A  I have personally reviewed following labs and imaging studies  LABORATORY DATA: CBC: Recent Labs  Lab  07/17/18 1621 07/18/18 0124 07/19/18 0338 07/20/18 0249 07/23/18 0744  WBC 8.8 9.2 9.0 9.8 6.8  NEUTROABS 5.9  --   --   --   --   HGB 14.0 12.0* 12.1* 11.6* 11.0*  HCT 45.8 38.0* 38.4* 37.5* 34.7*  MCV 88.4 86.6 87.5 86.4 86.8  PLT 348 277 270 294 330    Basic Metabolic Panel: Recent Labs  Lab 07/19/18 0338 07/20/18 0249 07/21/18 0703 07/22/18 0252 07/23/18 0744 07/24/18 0251  NA 139 138 139 138 140  --   K 3.8 4.0 3.8 3.9 4.1  --   CL 107 104 103 105 105  --   CO2 19* 23 27 21* 23  --   GLUCOSE 114* 211* 159* 188* 174*  --   BUN 11 9 10 11 9   --   CREATININE 1.45* 1.57* 1.52* 1.55* 1.48* 1.40*  CALCIUM 8.8* 9.0 8.4* 8.4* 8.8*  --     GFR: Estimated Creatinine Clearance: 52.3 mL/min (A) (by C-G formula based on SCr of 1.4 mg/dL (H)).  Liver Function Tests: Recent Labs  Lab 07/17/18 1621 07/18/18 0124  AST 24 18  ALT 22 21  ALKPHOS 62 48  BILITOT 0.7 0.8  PROT 6.7 5.9*  ALBUMIN 3.8 3.2*   No results for input(s): LIPASE, AMYLASE in the last 168 hours. No results for input(s): AMMONIA in the last 168 hours.  Coagulation Profile: Recent Labs  Lab 07/23/18 0744  INR 0.92    Cardiac Enzymes: No results for input(s): CKTOTAL, CKMB, CKMBINDEX, TROPONINI in the last 168 hours.  BNP (last 3 results) No results for input(s): PROBNP in the last 8760 hours.  HbA1C: No results for input(s): HGBA1C in the last 72 hours.  CBG: Recent Labs  Lab 07/23/18 1234 07/23/18 1659 07/23/18 2127 07/24/18 0558 07/24/18 0829  GLUCAP 131* 186* 141* 141* 168*    Lipid Profile: No results for input(s): CHOL, HDL, LDLCALC, TRIG, CHOLHDL, LDLDIRECT in the last 72 hours.  Thyroid Function Tests: No results for input(s): TSH, T4TOTAL, FREET4, T3FREE, THYROIDAB in the last 72 hours.  Anemia Panel: No results for input(s): VITAMINB12, FOLATE, FERRITIN, TIBC, IRON, RETICCTPCT in the last 72 hours.  Urine analysis:    Component Value Date/Time   COLORURINE YELLOW  12/25/2016 1721   APPEARANCEUR CLEAR 12/25/2016 1721   LABSPEC 1.013 12/25/2016 1721   PHURINE 5.0 12/25/2016 1721   GLUCOSEU 50 (A) 12/25/2016 1721   HGBUR MODERATE (A) 12/25/2016 1721   BILIRUBINUR NEGATIVE 12/25/2016 1721   KETONESUR NEGATIVE 12/25/2016 1721   PROTEINUR NEGATIVE 12/25/2016 1721   UROBILINOGEN 0.2 10/30/2014 0004   NITRITE NEGATIVE 12/25/2016 1721   LEUKOCYTESUR NEGATIVE 12/25/2016 1721    Sepsis Labs: Lactic Acid, Venous    Component Value Date/Time   LATICACIDVEN 2.6 (HH) 07/18/2018 0316    MICROBIOLOGY: Recent Results (from the past 240 hour(s))  Blood culture (routine x 2)     Status: None (Preliminary result)   Collection Time: 07/17/18  7:50 PM  Result Value Ref Range Status   Specimen Description BLOOD LEFT ANTECUBITAL  Final   Special Requests   Final    BOTTLES DRAWN AEROBIC AND ANAEROBIC Blood Culture adequate volume   Culture   Final    NO GROWTH 4 DAYS Performed at [facility], [address], [place], [place] [address]    Report Status PENDING  Incomplete  Blood culture (routine x 2)     Status: None (Preliminary result)   Collection Time: 07/17/18  7:57 PM  Result Value Ref Range Status   Specimen Description BLOOD LEFT HAND  Final   Special Requests   Final    BOTTLES DRAWN AEROBIC AND ANAEROBIC Blood Culture results may not be optimal due to an inadequate volume of blood received in culture bottles   Culture   Final    NO GROWTH 4 DAYS Performed at [facility], [address] [place], [place] [address]    Report Status PENDING  Incomplete  Surgical pcr screen     Status: Abnormal   Collection Time: 07/20/18 12:28 AM  Result Value Ref Range Status   MRSA, PCR NEGATIVE NEGATIVE Final   Staphylococcus aureus POSITIVE (A) NEGATIVE Final    Comment: (NOTE) The Xpert SA Assay (FDA approved for NASAL specimens in patients 76 years of age and older), is one component of a comprehensive surveillance program. It is not  intended to diagnose infection nor to guide or monitor treatment. Performed at [facility], [address], [place], [place] [address]   Aerobic/Anaerobic Culture (surgical/deep wound)  Status: None (Preliminary result)   Collection Time: 07/20/18  2:37 PM  Result Value Ref Range Status   Specimen Description ABSCESS RIGHT LEG  Final   Special Requests NONE  Final   Gram Stain   Final    ABUNDANT WBC PRESENT, PREDOMINANTLY PMN NO ORGANISMS SEEN Performed at [facility], [address] [place], [place] [address]    Culture   Final    RARE STAPHYLOCOCCUS AUREUS NO ANAEROBES ISOLATED; CULTURE IN PROGRESS FOR 5 DAYS    Report Status PENDING  Incomplete   Organism ID, Bacteria STAPHYLOCOCCUS AUREUS  Final      Susceptibility   Staphylococcus aureus - MIC*    CIPROFLOXACIN <=0.5 SENSITIVE Sensitive     ERYTHROMYCIN <=0.25 SENSITIVE Sensitive     GENTAMICIN <=0.5 SENSITIVE Sensitive     OXACILLIN <=0.25 SENSITIVE Sensitive     TETRACYCLINE <=1 SENSITIVE Sensitive     VANCOMYCIN 1 SENSITIVE Sensitive     TRIMETH/SULFA <=10 SENSITIVE Sensitive     CLINDAMYCIN <=0.25 SENSITIVE Sensitive     RIFAMPIN <=0.5 SENSITIVE Sensitive     Inducible Clindamycin NEGATIVE Sensitive     * RARE STAPHYLOCOCCUS AUREUS    RADIOLOGY STUDIES/RESULTS: Dg Tibia/fibula Right  Result Date: 07/17/2018 CLINICAL DATA:  Cellulitis EXAM: RIGHT TIBIA AND FIBULA - 2 VIEW COMPARISON:  11/10/2014 FINDINGS: No skeletal abnormality. No soft tissue mass or gas in the soft tissues. No foreign body. Mild calcification in the medial leg may represent dermal calcification and is unchanged from the prior study. IMPRESSION: No acute bony or soft tissue abnormality right lower leg Electronically Signed   By: Marlan Palau M.D.   On: 07/17/2018 19:22   Mr Tibia Fibula Right Wo Contrast  Result Date: 07/18/2018 CLINICAL DATA:  Right lower leg pain and swelling over the past 2 weeks which worsened over the  past 2 days. The patient is diabetic. EXAM: MRI OF LOWER RIGHT EXTREMITY WITHOUT CONTRAST TECHNIQUE: Multiplanar, multisequence MR imaging of the right lower leg was performed. No intravenous contrast was administered. COMPARISON:  Plain films right lower leg 07/17/2018. FINDINGS: Bones/Joint/Cartilage Bone marrow signal is normal throughout without fracture, stress change or focal lesion. No evidence of osteomyelitis. Ligaments Negative. Muscles and Tendons Mildly increased T2 signal is seen in the distal aspect of the medial gastrocnemius and underlying medial periphery of the soleus. No intramuscular fluid collection. No tear. There is some fatty atrophy of lower leg musculature bilaterally which appears symmetric. Soft tissues Subcutaneous increased T2 signal in the lower legs bilaterally is much worse on the right. A fluid collection in the posterior, medial soft tissues of the right lower leg measures 0.8 cm transverse x 0.9 cm AP x 1.3 cm craniocaudal. A second small fluid collection more inferiorly in the subcutaneous tissues measures 0.8 cm craniocaudal by 0.6 cm transverse by 0.6 cm AP. A third tiny fluid collection in the subcutaneous tissues measuring 0.9 cm is identified. Finally, there is a fourth fluid collection in the subcutaneous tissues lateral to the Achilles tendon which measures 0.6 cm in diameter. IMPRESSION: Extensive subcutaneous edema about the right lower leg is compatible with cellulitis. Mild edema in the inferior aspect of the medial gastrocnemius and lateral periphery of the underlying soleus could be due to myositis or related to denervation atrophy. Four subcutaneous fluid collections measuring 1.3 cm or less in diameter in the right lower leg as described above are worrisome for abscesses. Electronically Signed   By: Drusilla Kanner M.D.  On: 07/18/2018 12:34   [place] Rt Lower Extrem Ltd Soft Tissue Non Vascular  Result Date: 07/17/2018 CLINICAL DATA:  Cellulitis EXAM: ULTRASOUND  right LOWER EXTREMITY LIMITED TECHNIQUE: Ultrasound examination of the lower extremity soft tissues was performed in the area of clinical concern. COMPARISON:  Tib-fib x-rays from today FINDINGS: Scanning limited to the area of concern in the right lower leg soft tissues. There is subcutaneous edema. No fluid collection or mass lesion. Negative for abscess. Increased blood flow to the soft tissues on Doppler consistent with cellulitis IMPRESSION: Diffuse soft tissue swelling.  Negative for abscess right lower leg. Electronically Signed   By: Marlan Palau M.D.   On: 07/17/2018 19:21     LOS: 7 days   Jeoffrey Massed, MD  [facility]  If 7PM-7AM, please contact night-coverage  Please page via [email or web address] [email or web address]-click on MD name and type text message  07/24/2018, 10:54 AM

## 2018-07-24 NOTE — Progress Notes (Signed)
[facility]  [facility] will follow pt with ID team to support Home Infusion Pharmacy services if needed at DC to home.   If patient discharges after hours, please call [phone].   Sedalia Mutaamela S Chandler 07/24/2018, 12:09 PM

## 2018-07-24 NOTE — Discharge Summary (Addendum)
PATIENT DETAILS Name: James Roberson Age: 72 y.o. Sex: male Date of Birth: Aug 21, 1945 MRN: [id number]. Admitting Physician: Eduard Clos, MD [id number], No Pcp Per  Admit Date: 07/17/2018 Discharge date: 07/24/2018  Recommendations for Outpatient Follow-up:  1. Follow up with PCP in 1-2 weeks 2. Please obtain BMP/CBC in one week 3. Please ensure follow-up with orthopedics in 1 week  Admitted From:  Home  Disposition: Home with home health services   Home Health:  Yes  Equipment/Devices: None  Discharge Condition: Stable  CODE STATUS: FULL CODE  Diet recommendation:  Heart Healthy / Carb Modified  Brief Summary: See H&P, Labs, Consult and Test reports for all details in brief,Patient is a 72 y.o. male with history of DM-2, hypertension, dyslipidemia-presenting with erythema, swelling of the right lower extremity.  MRI of the right lower extremity demonstrating small subcutaneous abscesses.  See below for further details  Brief Hospital Course: Right lower extremity cellulitis with abscesses: Underwent I&D on 12/23-intraoperative cultures positive for MSSA.  Initially on IV vancomycin, but now has been transition to Keflex based on sensitivities of intraoperative cultures.  Spoke with orthopedics-Dr Xu-wound looks good-with no signs of residual infection.  Abscesses were superficial and small-he does not think patient requires IV antimicrobial therapy on discharge-and I fully concur.    Extensive reassurance was provided, in fact patient was seen by infectious disease who agreed with Keflex-infectious disease recommended  stop date of 12/23.  Hospital course was complicated by some mild bleeding from his operative site-orthopedics has provided wound care instructions-home health RN has been ordered.  Per orthopedic note-no bleeding noted in dressing change today-and stable for d/c today once family is comfortable with dressing changes (taught by RN this afternoon).     Hypertension: Controlled-continue lisinopril.  DM-2: CBG stable with SSI-resume oral hypoglycemics on discharge.  Dyslipidemia: Continue statin.  CKD stage III: Creatinine close to usual baseline-follow electrolytes periodically  Gout: Appears to be stable-no evidence of flare.  Procedures/Studies: 11/22>> I&D right leg  Discharge Diagnoses:  Principal Problem:   Abscess of right lower leg Active Problems:   GOUT   Essential hypertension   Cellulitis   CKD (chronic kidney disease) stage 3, GFR 30-59 ml/min (HCC)   Cellulitis of right lower extremity   Diabetes mellitus type 2 in obese [facility])   Discharge Instructions:  Activity:  As tolerated  Discharge Instructions    Call MD for:  redness, tenderness, or signs of infection (pain, swelling, redness, odor or green/yellow discharge around incision site)   Complete by:  As directed    Diet - low sodium heart healthy   Complete by:  As directed    Diet Carb Modified   Complete by:  As directed    Discharge instructions   Complete by:  As directed    Follow with Primary MD IN 1 week  Follow with Dr Roda Shutters (Orthopedics) in 1 week  Please get a complete blood count and chemistry panel checked by your Primary MD at your next visit, and again as instructed by your Primary MD.  Get Medicines reviewed and adjusted: Please take all your medications with you for your next visit with your Primary MD  Laboratory/radiological data: Please request your Primary MD to go over all hospital tests and procedure/radiological results at the follow up, please ask your Primary MD to get all Hospital records sent to his/her office.  In some cases, they will be blood work, cultures and biopsy results pending at the time of  your discharge. Please request that your primary care M.D. follows up on these results.  Also Note the following: If you experience worsening of your admission symptoms, develop shortness of breath, life threatening  emergency, suicidal or homicidal thoughts you must seek medical attention immediately by calling 911 or calling your MD immediately  if symptoms less severe.  You must read complete instructions/literature along with all the possible adverse reactions/side effects for all the Medicines you take and that have been prescribed to you. Take any new Medicines after you have completely understood and accpet all the possible adverse reactions/side effects.   Do not drive when taking Pain medications or sleeping medications (Benzodaizepines)  Do not take more than prescribed Pain, Sleep and Anxiety Medications. It is not advisable to combine anxiety,sleep and pain medications without talking with your primary care practitioner  Special Instructions: If you have smoked or chewed Tobacco  in the last 2 yrs please stop smoking, stop any regular Alcohol  and or any Recreational drug use.  Wear Seat belts while driving.  Please note: You were cared for by a hospitalist during your hospital stay. Once you are discharged, your primary care physician will handle any further medical issues. Please note that NO REFILLS for any discharge medications will be authorized once you are discharged, as it is imperative that you return to your primary care physician (or establish a relationship with a primary care physician if you do not have one) for your post hospital discharge needs so that they can reassess your need for medications and monitor your lab values.   Increase activity slowly   Complete by:  As directed      Allergies as of 07/24/2018      Reactions   Avelox Abc [moxifloxacin] Anaphylaxis   Tape Rash   PAPER TAPE ONLY!!   Ambien Cr [zolpidem Tartrate Er] Other (See Comments)    Hallucinations   Zolpidem Other (See Comments)    Hallucinations - name brand only - tolerates generic   Other Rash   Nuderm and Coban      Medication List    STOP taking these medications   vancomycin 1,500 mg in  sodium chloride 0.9 % 500 mL     TAKE these medications   atorvastatin 40 MG tablet Commonly known as:  LIPITOR Take 40 mg by mouth at bedtime.   buPROPion 150 MG 24 hr tablet Commonly known as:  WELLBUTRIN XL Take 150 mg by mouth daily.   celecoxib 100 MG capsule Commonly known as:  CELEBREX Take 100 mg by mouth at bedtime.   cephALEXin 500 MG capsule Commonly known as:  KEFLEX Take 1 capsule (500 mg total) by mouth every 8 (eight) hours.   gabapentin 300 MG capsule Commonly known as:  NEURONTIN Take 600 mg by mouth at bedtime.   insulin detemir 100 UNIT/ML injection Commonly known as:  LEVEMIR Inject 0.18 mLs (18 Units total) into the skin at bedtime.   Lidocaine 5 % Crea Apply 1 application topically See admin instructions. Apply to affected area 3 times a day as directed   lisinopril 5 MG tablet Commonly known as:  PRINIVIL,ZESTRIL Take 5 mg by mouth at bedtime.   lubriderm seriously sensitive Lotn Apply 1 application topically 2 (two) times daily.   metFORMIN 500 MG 24 hr tablet Commonly known as:  GLUCOPHAGE-XR Take 1,000 mg by mouth 2 (two) times daily.   ondansetron 4 MG disintegrating tablet Commonly known as:  ZOFRAN ODT Take 1 tablet (  4 mg total) by mouth every 8 (eight) hours as needed for nausea or vomiting.   oxyCODONE-acetaminophen 5-325 MG tablet Commonly known as:  PERCOCET/ROXICET Take 1-2 tablets by mouth every 6 (six) hours as needed for severe pain. What changed:    how much to take  when to take this   saccharomyces boulardii 250 MG capsule Commonly known as:  FLORASTOR Take 1 capsule (250 mg total) by mouth 2 (two) times daily.   tamsulosin 0.4 MG Caps capsule Commonly known as:  FLOMAX Take 1 capsule (0.4 mg total) by mouth daily.   TRULICITY 1.5 MG/0.5ML Sopn Generic drug:  Dulaglutide Inject 0.5 mLs into the skin every Friday.   vitamin B-12 500 MCG tablet Commonly known as:  CYANOCOBALAMIN Take 500 mcg by mouth every  other day.            Durable Medical Equipment  (From admission, onward)         Start     Ordered   07/20/18 1642  DME Walker rolling  Once    Question:  Patient needs a walker to treat with the following condition  Answer:  History of open reduction and internal fixation (ORIF) procedure   07/20/18 1641   07/20/18 1642  DME 3 n 1  Once     07/20/18 1641   07/20/18 1642  DME Bedside commode  Once    Question:  Patient needs a bedside commode to treat with the following condition  Answer:  History of open reduction and internal fixation (ORIF) procedure   07/20/18 1641         Follow-up Information    Tarry Kos, MD In 1 week.   Specialty:  Orthopedic Surgery Why:  For wound re-check Contact information: [address] [place] [address] [phone]        PRIMARY CARE MD. Schedule an appointment as soon as possible for a visit in 1 week(s).          Allergies  Allergen Reactions  . Avelox Abc [Moxifloxacin] Anaphylaxis  . Tape Rash    PAPER TAPE ONLY!!  . Ambien Cr [Zolpidem Tartrate Er] Other (See Comments)     Hallucinations  . Zolpidem Other (See Comments)     Hallucinations - name brand only - tolerates generic   . Other Rash    Nuderm and Coban     Consultations:   ID and orthopedic surgery  Other Procedures/Studies: Dg Tibia/fibula Right  Result Date: 07/17/2018 CLINICAL DATA:  Cellulitis EXAM: RIGHT TIBIA AND FIBULA - 2 VIEW COMPARISON:  11/10/2014 FINDINGS: No skeletal abnormality. No soft tissue mass or gas in the soft tissues. No foreign body. Mild calcification in the medial leg may represent dermal calcification and is unchanged from the prior study. IMPRESSION: No acute bony or soft tissue abnormality right lower leg Electronically Signed   By: Marlan Palau M.D.   On: 07/17/2018 19:22   Mr Tibia Fibula Right Wo Contrast  Result Date: 07/18/2018 CLINICAL DATA:  Right lower leg pain and swelling over the past 2  weeks which worsened over the past 2 days. The patient is diabetic. EXAM: MRI OF LOWER RIGHT EXTREMITY WITHOUT CONTRAST TECHNIQUE: Multiplanar, multisequence MR imaging of the right lower leg was performed. No intravenous contrast was administered. COMPARISON:  Plain films right lower leg 07/17/2018. FINDINGS: Bones/Joint/Cartilage Bone marrow signal is normal throughout without fracture, stress change or focal lesion. No evidence of osteomyelitis. Ligaments Negative. Muscles and Tendons Mildly increased T2  signal is seen in the distal aspect of the medial gastrocnemius and underlying medial periphery of the soleus. No intramuscular fluid collection. No tear. There is some fatty atrophy of lower leg musculature bilaterally which appears symmetric. Soft tissues Subcutaneous increased T2 signal in the lower legs bilaterally is much worse on the right. A fluid collection in the posterior, medial soft tissues of the right lower leg measures 0.8 cm transverse x 0.9 cm AP x 1.3 cm craniocaudal. A second small fluid collection more inferiorly in the subcutaneous tissues measures 0.8 cm craniocaudal by 0.6 cm transverse by 0.6 cm AP. A third tiny fluid collection in the subcutaneous tissues measuring 0.9 cm is identified. Finally, there is a fourth fluid collection in the subcutaneous tissues lateral to the Achilles tendon which measures 0.6 cm in diameter. IMPRESSION: Extensive subcutaneous edema about the right lower leg is compatible with cellulitis. Mild edema in the inferior aspect of the medial gastrocnemius and lateral periphery of the underlying soleus could be due to myositis or related to denervation atrophy. Four subcutaneous fluid collections measuring 1.3 cm or less in diameter in the right lower leg as described above are worrisome for abscesses. Electronically Signed   By: Drusilla Kanner M.D.   On: 07/18/2018 12:34   [place] Rt Lower Extrem Ltd Soft Tissue Non Vascular  Result Date: 07/17/2018 CLINICAL  DATA:  Cellulitis EXAM: ULTRASOUND right LOWER EXTREMITY LIMITED TECHNIQUE: Ultrasound examination of the lower extremity soft tissues was performed in the area of clinical concern. COMPARISON:  Tib-fib x-rays from today FINDINGS: Scanning limited to the area of concern in the right lower leg soft tissues. There is subcutaneous edema. No fluid collection or mass lesion. Negative for abscess. Increased blood flow to the soft tissues on Doppler consistent with cellulitis IMPRESSION: Diffuse soft tissue swelling.  Negative for abscess right lower leg. Electronically Signed   By: Marlan Palau M.D.   On: 07/17/2018 19:21      TODAY-DAY OF DISCHARGE:  Subjective:   Aiman Noe today has no headache,no chest abdominal pain,no new weakness tingling or numbness, feels much better wants to go home today.   Objective:   Blood pressure (!) 152/78, pulse 78, temperature 97.7 F (36.5 C), temperature source Oral, resp. rate 18, height 5\' 9"  (1.753 m), weight 87.7 kg, SpO2 100 %. No intake or output data in the 24 hours ending 07/24/18 1401 Filed Weights   07/17/18 2300  Weight: 87.7 kg    Exam: Awake Alert, Oriented *3, No new F.N deficits, Normal affect [place].AT,PERRAL Supple Neck,No JVD, No cervical lymphadenopathy appriciated.  Symmetrical Chest wall movement, Good air movement bilaterally, CTAB RRR,No Gallops,Rubs or new Murmurs, No Parasternal Heave +ve B.Sounds, Abd Soft, Non tender, No organomegaly appriciated, No rebound -guarding or rigidity. No Cyanosis, Clubbing or edema, No new Rash or bruise   PERTINENT RADIOLOGIC STUDIES: Dg Tibia/fibula Right  Result Date: 07/17/2018 CLINICAL DATA:  Cellulitis EXAM: RIGHT TIBIA AND FIBULA - 2 VIEW COMPARISON:  11/10/2014 FINDINGS: No skeletal abnormality. No soft tissue mass or gas in the soft tissues. No foreign body. Mild calcification in the medial leg may represent dermal calcification and is unchanged from the prior study. IMPRESSION: No  acute bony or soft tissue abnormality right lower leg Electronically Signed   By: Marlan Palau M.D.   On: 07/17/2018 19:22   Mr Tibia Fibula Right Wo Contrast  Result Date: 07/18/2018 CLINICAL DATA:  Right lower leg pain and swelling over the past 2 weeks which worsened over  the past 2 days. The patient is diabetic. EXAM: MRI OF LOWER RIGHT EXTREMITY WITHOUT CONTRAST TECHNIQUE: Multiplanar, multisequence MR imaging of the right lower leg was performed. No intravenous contrast was administered. COMPARISON:  Plain films right lower leg 07/17/2018. FINDINGS: Bones/Joint/Cartilage Bone marrow signal is normal throughout without fracture, stress change or focal lesion. No evidence of osteomyelitis. Ligaments Negative. Muscles and Tendons Mildly increased T2 signal is seen in the distal aspect of the medial gastrocnemius and underlying medial periphery of the soleus. No intramuscular fluid collection. No tear. There is some fatty atrophy of lower leg musculature bilaterally which appears symmetric. Soft tissues Subcutaneous increased T2 signal in the lower legs bilaterally is much worse on the right. A fluid collection in the posterior, medial soft tissues of the right lower leg measures 0.8 cm transverse x 0.9 cm AP x 1.3 cm craniocaudal. A second small fluid collection more inferiorly in the subcutaneous tissues measures 0.8 cm craniocaudal by 0.6 cm transverse by 0.6 cm AP. A third tiny fluid collection in the subcutaneous tissues measuring 0.9 cm is identified. Finally, there is a fourth fluid collection in the subcutaneous tissues lateral to the Achilles tendon which measures 0.6 cm in diameter. IMPRESSION: Extensive subcutaneous edema about the right lower leg is compatible with cellulitis. Mild edema in the inferior aspect of the medial gastrocnemius and lateral periphery of the underlying soleus could be due to myositis or related to denervation atrophy. Four subcutaneous fluid collections measuring 1.3 cm  or less in diameter in the right lower leg as described above are worrisome for abscesses. Electronically Signed   By: Drusilla Kanner M.D.   On: 07/18/2018 12:34   [place] Rt Lower Extrem Ltd Soft Tissue Non Vascular  Result Date: 07/17/2018 CLINICAL DATA:  Cellulitis EXAM: ULTRASOUND right LOWER EXTREMITY LIMITED TECHNIQUE: Ultrasound examination of the lower extremity soft tissues was performed in the area of clinical concern. COMPARISON:  Tib-fib x-rays from today FINDINGS: Scanning limited to the area of concern in the right lower leg soft tissues. There is subcutaneous edema. No fluid collection or mass lesion. Negative for abscess. Increased blood flow to the soft tissues on Doppler consistent with cellulitis IMPRESSION: Diffuse soft tissue swelling.  Negative for abscess right lower leg. Electronically Signed   By: Marlan Palau M.D.   On: 07/17/2018 19:21     PERTINENT LAB RESULTS: CBC: Recent Labs    07/23/18 0744  WBC 6.8  HGB 11.0*  HCT 34.7*  PLT 330   CMET CMP     Component Value Date/Time   NA 140 07/23/2018 0744   K 4.1 07/23/2018 0744   CL 105 07/23/2018 0744   CO2 23 07/23/2018 0744   GLUCOSE 174 (H) 07/23/2018 0744   BUN 9 07/23/2018 0744   CREATININE 1.40 (H) 07/24/2018 0251   CALCIUM 8.8 (L) 07/23/2018 0744   PROT 5.9 (L) 07/18/2018 0124   ALBUMIN 3.2 (L) 07/18/2018 0124   AST 18 07/18/2018 0124   ALT 21 07/18/2018 0124   ALKPHOS 48 07/18/2018 0124   BILITOT 0.8 07/18/2018 0124   GFRNONAA 50 (L) 07/24/2018 0251   GFRAA 58 (L) 07/24/2018 0251    GFR Estimated Creatinine Clearance: 52.3 mL/min (A) (by C-G formula based on SCr of 1.4 mg/dL (H)). No results for input(s): LIPASE, AMYLASE in the last 72 hours. No results for input(s): CKTOTAL, CKMB, CKMBINDEX, TROPONINI in the last 72 hours. Invalid input(s): POCBNP No results for input(s): DDIMER in the last 72 hours. No results for  input(s): HGBA1C in the last 72 hours. No results for input(s): CHOL, HDL,  LDLCALC, TRIG, CHOLHDL, LDLDIRECT in the last 72 hours. No results for input(s): TSH, T4TOTAL, T3FREE, THYROIDAB in the last 72 hours.  Invalid input(s): FREET3 No results for input(s): VITAMINB12, FOLATE, FERRITIN, TIBC, IRON, RETICCTPCT in the last 72 hours. Coags: Recent Labs    07/23/18 0744  INR 0.92   Microbiology: Recent Results (from the past 240 hour(s))  Blood culture (routine x 2)     Status: None (Preliminary result)   Collection Time: 07/17/18  7:50 PM  Result Value Ref Range Status   Specimen Description BLOOD LEFT ANTECUBITAL  Final   Special Requests   Final    BOTTLES DRAWN AEROBIC AND ANAEROBIC Blood Culture adequate volume   Culture   Final    NO GROWTH 4 DAYS Performed at [facility], [address], [place], [place] [address]    Report Status PENDING  Incomplete  Blood culture (routine x 2)     Status: None (Preliminary result)   Collection Time: 07/17/18  7:57 PM  Result Value Ref Range Status   Specimen Description BLOOD LEFT HAND  Final   Special Requests   Final    BOTTLES DRAWN AEROBIC AND ANAEROBIC Blood Culture results may not be optimal due to an inadequate volume of blood received in culture bottles   Culture   Final    NO GROWTH 4 DAYS Performed at [facility], [address], [place], [place] [address]    Report Status PENDING  Incomplete  Surgical pcr screen     Status: Abnormal   Collection Time: 07/20/18 12:28 AM  Result Value Ref Range Status   MRSA, PCR NEGATIVE NEGATIVE Final   Staphylococcus aureus POSITIVE (A) NEGATIVE Final    Comment: (NOTE) The Xpert SA Assay (FDA approved for NASAL specimens in patients 9 years of age and older), is one component of a comprehensive surveillance program. It is not intended to diagnose infection nor to guide or monitor treatment. Performed at [facility], [address], [place], [place] [address]   Aerobic/Anaerobic Culture (surgical/deep wound)     Status: None  (Preliminary result)   Collection Time: 07/20/18  2:37 PM  Result Value Ref Range Status   Specimen Description ABSCESS RIGHT LEG  Final   Special Requests NONE  Final   Gram Stain   Final    ABUNDANT WBC PRESENT, PREDOMINANTLY PMN NO ORGANISMS SEEN Performed at [facility], [address] [place], [place] [address]    Culture   Final    RARE STAPHYLOCOCCUS AUREUS NO ANAEROBES ISOLATED; CULTURE IN PROGRESS FOR 5 DAYS    Report Status PENDING  Incomplete   Organism ID, Bacteria STAPHYLOCOCCUS AUREUS  Final      Susceptibility   Staphylococcus aureus - MIC*    CIPROFLOXACIN <=0.5 SENSITIVE Sensitive     ERYTHROMYCIN <=0.25 SENSITIVE Sensitive     GENTAMICIN <=0.5 SENSITIVE Sensitive     OXACILLIN <=0.25 SENSITIVE Sensitive     TETRACYCLINE <=1 SENSITIVE Sensitive     VANCOMYCIN 1 SENSITIVE Sensitive     TRIMETH/SULFA <=10 SENSITIVE Sensitive     CLINDAMYCIN <=0.25 SENSITIVE Sensitive     RIFAMPIN <=0.5 SENSITIVE Sensitive     Inducible Clindamycin NEGATIVE Sensitive     * RARE STAPHYLOCOCCUS AUREUS    FURTHER DISCHARGE INSTRUCTIONS:  Get Medicines reviewed and adjusted: Please take all your medications with you for your next  visit with your Primary MD  Laboratory/radiological data: Please request your Primary MD to go over all hospital tests and procedure/radiological results at the follow up, please ask your Primary MD to get all Hospital records sent to his/her office.  In some cases, they will be blood work, cultures and biopsy results pending at the time of your discharge. Please request that your primary care M.D. goes through all the records of your hospital data and follows up on these results.  Also Note the following: If you experience worsening of your admission symptoms, develop shortness of breath, life threatening emergency, suicidal or homicidal thoughts you must seek medical attention immediately by calling 911 or calling your MD immediately  if symptoms  less severe.  You must read complete instructions/literature along with all the possible adverse reactions/side effects for all the Medicines you take and that have been prescribed to you. Take any new Medicines after you have completely understood and accpet all the possible adverse reactions/side effects.   Do not drive when taking Pain medications or sleeping medications (Benzodaizepines)  Do not take more than prescribed Pain, Sleep and Anxiety Medications. It is not advisable to combine anxiety,sleep and pain medications without talking with your primary care practitioner  Special Instructions: If you have smoked or chewed Tobacco  in the last 2 yrs please stop smoking, stop any regular Alcohol  and or any Recreational drug use.  Wear Seat belts while driving.  Please note: You were cared for by a hospitalist during your hospital stay. Once you are discharged, your primary care physician will handle any further medical issues. Please note that NO REFILLS for any discharge medications will be authorized once you are discharged, as it is imperative that you return to your primary care physician (or establish a relationship with a primary care physician if you do not have one) for your post hospital discharge needs so that they can reassess your need for medications and monitor your lab values.  Total Time spent coordinating discharge including counseling, education and face to face time equals 35 minutes.  SignedJeoffrey Massed 07/24/2018 2:01 PM

## 2018-07-24 NOTE — Discharge Instructions (Signed)
Pack wounds once daily with wet-to-dry dressings (sterile saline) as well as pressure bandage until f/u appt with Dr. Roda ShuttersXu. Elevate for swelling.

## 2018-07-24 NOTE — Care Management Note (Addendum)
Case Management Note  Patient Details  Name: James DustJonathan S Roberson MRN: [id number] Date of Birth: [birth year]  Subjective/Objective:  From home with wife, for dc today, [facility], soc will begin 24-48 hrs post dc.    Patient states he does not want rolling walker or 3 n 1.                 Action/Plan: DC when ready.   Expected Discharge Date:  07/24/18               Expected Discharge Plan:  Home w Home Health Services  In-House Referral:     Discharge planning Services  CM Consult  Post Acute Care Choice:  Home Health Choice offered to:  Patient  DME Arranged:    DME Agency:     HH Arranged:  RN [facility]  Status of Service:  Completed, signed off  If discussed at Long Length of Stay Meetings, dates discussed:    Additional Comments:  Leone Havenaylor, Rande Dario Clinton, RN 07/24/2018, 3:41 PM

## 2018-07-25 DIAGNOSIS — E669 Obesity, unspecified: Secondary | ICD-10-CM | POA: Diagnosis not present

## 2018-07-25 DIAGNOSIS — B9561 Methicillin susceptible Staphylococcus aureus infection as the cause of diseases classified elsewhere: Secondary | ICD-10-CM | POA: Diagnosis not present

## 2018-07-25 DIAGNOSIS — Z6827 Body mass index (BMI) 27.0-27.9, adult: Secondary | ICD-10-CM | POA: Diagnosis not present

## 2018-07-25 DIAGNOSIS — Z7984 Long term (current) use of oral hypoglycemic drugs: Secondary | ICD-10-CM | POA: Diagnosis not present

## 2018-07-25 DIAGNOSIS — I129 Hypertensive chronic kidney disease with stage 1 through stage 4 chronic kidney disease, or unspecified chronic kidney disease: Secondary | ICD-10-CM | POA: Diagnosis not present

## 2018-07-25 DIAGNOSIS — Z792 Long term (current) use of antibiotics: Secondary | ICD-10-CM | POA: Diagnosis not present

## 2018-07-25 DIAGNOSIS — M1030 Gout due to renal impairment, unspecified site: Secondary | ICD-10-CM | POA: Diagnosis not present

## 2018-07-25 DIAGNOSIS — E1169 Type 2 diabetes mellitus with other specified complication: Secondary | ICD-10-CM | POA: Diagnosis not present

## 2018-07-25 DIAGNOSIS — Z79891 Long term (current) use of opiate analgesic: Secondary | ICD-10-CM | POA: Diagnosis not present

## 2018-07-25 DIAGNOSIS — Z9181 History of falling: Secondary | ICD-10-CM | POA: Diagnosis not present

## 2018-07-25 DIAGNOSIS — I708 Atherosclerosis of other arteries: Secondary | ICD-10-CM | POA: Diagnosis not present

## 2018-07-25 DIAGNOSIS — E785 Hyperlipidemia, unspecified: Secondary | ICD-10-CM | POA: Diagnosis not present

## 2018-07-25 DIAGNOSIS — E1122 Type 2 diabetes mellitus with diabetic chronic kidney disease: Secondary | ICD-10-CM | POA: Diagnosis not present

## 2018-07-25 DIAGNOSIS — L03115 Cellulitis of right lower limb: Secondary | ICD-10-CM | POA: Diagnosis not present

## 2018-07-25 DIAGNOSIS — Z87891 Personal history of nicotine dependence: Secondary | ICD-10-CM | POA: Diagnosis not present

## 2018-07-25 DIAGNOSIS — N183 Chronic kidney disease, stage 3 (moderate): Secondary | ICD-10-CM | POA: Diagnosis not present

## 2018-07-25 LAB — AEROBIC/ANAEROBIC CULTURE W GRAM STAIN (SURGICAL/DEEP WOUND)

## 2018-07-25 LAB — AEROBIC/ANAEROBIC CULTURE (SURGICAL/DEEP WOUND)

## 2018-07-27 ENCOUNTER — Ambulatory Visit (INDEPENDENT_AMBULATORY_CARE_PROVIDER_SITE_OTHER): Payer: Medicare Other | Admitting: Physician Assistant

## 2018-07-27 ENCOUNTER — Encounter (INDEPENDENT_AMBULATORY_CARE_PROVIDER_SITE_OTHER): Payer: Self-pay | Admitting: Orthopaedic Surgery

## 2018-07-27 VITALS — Ht 69.0 in | Wt 193.3 lb

## 2018-07-27 DIAGNOSIS — L02419 Cutaneous abscess of limb, unspecified: Secondary | ICD-10-CM

## 2018-07-27 MED ORDER — HYDROCODONE-ACETAMINOPHEN 5-325 MG PO TABS: 1.0000 | ORAL_TABLET | Freq: Two times a day (BID) | ORAL | 0 refills | Status: DC | PRN

## 2018-07-27 MED ORDER — CEPHALEXIN 500 MG PO CAPS: 500.0000 mg | ORAL_CAPSULE | Freq: Three times a day (TID) | ORAL | 0 refills | Status: DC

## 2018-07-27 NOTE — Progress Notes (Signed)
Post-Op Visit Note   Patient: James Roberson           Date of Birth: 1945/10/05           MRN: [id number] Visit Date: 07/27/2018 PCP: Patient, No Pcp Per   Assessment & Plan:  Chief Complaint:  Chief Complaint  Patient presents with  . Right Leg - Follow-up, Routine Post Op   Visit Diagnoses:  1. Leg abscess     Plan: Patient is a pleasant 72 year old gentleman who presents to our clinic today 7 days status post irrigation debridement of multiple left lower leg abscesses.  He has been compliant with wet-to-dry dressing changes since being discharged from the hospital this past Tuesday.  He does note that home health has been out to the house once this week and plans to return 2-3 times a week.  He will be doing this twice daily.  He has been on Keflex and notes that he will run out this coming Monday.  No fevers or chills.  He still has some pain to the medial aspect of the lower leg.  This is not worsened.  He denies any drainage to any of the wounds.  Examination of the left lower leg reveals well-healing wounds.  Minimal erythema.  He still has some induration to the left lower leg primarily to the medial aspect.  This is moderately tender to deep palpation.  Calf is soft and nontender.  At this point, we will redress the wounds with wet-to-dry dressings.  We will have the patient home health continue this twice daily.  I will refill his Keflex for another 10 days.  Follow-up with us in 2 weeks time for wound check.  Call with concerns or questions in the meantime.  Follow-Up Instructions: Return in about 2 weeks (around 08/10/2018).   Orders:  No orders of the defined types were placed in this encounter.  Meds ordered this encounter  Medications  . DISCONTD: HYDROcodone-acetaminophen (NORCO) 5-325 MG tablet    Sig: Take 1 tablet by mouth 2 (two) times daily as needed for moderate pain.    Dispense:  14 tablet    Refill:  0  . cephALEXin (KEFLEX) 500 MG capsule    Sig: Take 1  capsule (500 mg total) by mouth every 8 (eight) hours.    Dispense:  30 capsule    Refill:  0  . HYDROcodone-acetaminophen (NORCO) 5-325 MG tablet    Sig: Take 1 tablet by mouth 2 (two) times daily as needed for moderate pain.    Dispense:  14 tablet    Refill:  0    Imaging: No new imaging  PMFS History: Patient Active Problem List   Diagnosis Date Noted  . Leg abscess 07/22/2018  . Cellulitis of right lower extremity 07/17/2018  . Diabetes mellitus type 2 in obese (HCC) 07/17/2018  . CKD (chronic kidney disease) stage 3, GFR 30-59 ml/min (HCC) 11/11/2014  . Cellulitis 10/30/2014  . Diabetes mellitus type 2, controlled (HCC) 10/30/2014  . Hyperlipidemia 10/30/2014  . Renal failure (ARF), acute on chronic (HCC) 10/30/2014  . Sepsis (HCC) 10/30/2014  . ULCER, LEG 09/25/2008  . DIZZINESS 09/25/2008  . DIABETES MELLITUS, TYPE II 09/23/2008  . HYPERLIPIDEMIA 09/23/2008  . GOUT 09/23/2008  . Essential hypertension 09/23/2008  . PSORIASIS, HX OF 09/23/2008   Past Medical History:  Diagnosis Date  . Diabetes mellitus without complication (HCC)   . Hyperlipidemia   . Hypertension   . Obesity  Family History  Problem Relation Age of Onset  . CAD Father     Past Surgical History:  Procedure Laterality Date  . BACK SURGERY    . I&D EXTREMITY Right 07/20/2018   Procedure: IRRIGATION AND DEBRIDEMENT  OF LEG;  Surgeon: Tarry KosXu, Naiping M, MD;  Location: [facility] OR;  Service: Orthopedics;  Laterality: Right;  . LEG SURGERY    . LUMBAR LAMINECTOMY  1989   Social History   Occupational History  . Not on file  Tobacco Use  . Smoking status: Former Smoker    Last attempt to quit: 07/23/1966    Years since quitting: 52.0  . Smokeless tobacco: Never Used  Substance and Sexual Activity  . Alcohol use: Yes    Comment: 1 a quarter  . Drug use: Not on file  . Sexual activity: Not on file

## 2018-07-30 DIAGNOSIS — I129 Hypertensive chronic kidney disease with stage 1 through stage 4 chronic kidney disease, or unspecified chronic kidney disease: Secondary | ICD-10-CM | POA: Diagnosis not present

## 2018-07-30 DIAGNOSIS — N183 Chronic kidney disease, stage 3 (moderate): Secondary | ICD-10-CM | POA: Diagnosis not present

## 2018-07-30 DIAGNOSIS — L03115 Cellulitis of right lower limb: Secondary | ICD-10-CM | POA: Diagnosis not present

## 2018-07-30 DIAGNOSIS — B9561 Methicillin susceptible Staphylococcus aureus infection as the cause of diseases classified elsewhere: Secondary | ICD-10-CM | POA: Diagnosis not present

## 2018-07-30 DIAGNOSIS — M1030 Gout due to renal impairment, unspecified site: Secondary | ICD-10-CM | POA: Diagnosis not present

## 2018-07-30 DIAGNOSIS — E1122 Type 2 diabetes mellitus with diabetic chronic kidney disease: Secondary | ICD-10-CM | POA: Diagnosis not present

## 2018-08-02 DIAGNOSIS — L03119 Cellulitis of unspecified part of limb: Secondary | ICD-10-CM | POA: Diagnosis not present

## 2018-08-03 DIAGNOSIS — L03115 Cellulitis of right lower limb: Secondary | ICD-10-CM | POA: Diagnosis not present

## 2018-08-03 DIAGNOSIS — I129 Hypertensive chronic kidney disease with stage 1 through stage 4 chronic kidney disease, or unspecified chronic kidney disease: Secondary | ICD-10-CM | POA: Diagnosis not present

## 2018-08-03 DIAGNOSIS — E1122 Type 2 diabetes mellitus with diabetic chronic kidney disease: Secondary | ICD-10-CM | POA: Diagnosis not present

## 2018-08-03 DIAGNOSIS — M1030 Gout due to renal impairment, unspecified site: Secondary | ICD-10-CM | POA: Diagnosis not present

## 2018-08-03 DIAGNOSIS — B9561 Methicillin susceptible Staphylococcus aureus infection as the cause of diseases classified elsewhere: Secondary | ICD-10-CM | POA: Diagnosis not present

## 2018-08-03 DIAGNOSIS — N183 Chronic kidney disease, stage 3 (moderate): Secondary | ICD-10-CM | POA: Diagnosis not present

## 2018-08-06 DIAGNOSIS — N183 Chronic kidney disease, stage 3 (moderate): Secondary | ICD-10-CM | POA: Diagnosis not present

## 2018-08-06 DIAGNOSIS — B9561 Methicillin susceptible Staphylococcus aureus infection as the cause of diseases classified elsewhere: Secondary | ICD-10-CM | POA: Diagnosis not present

## 2018-08-06 DIAGNOSIS — I129 Hypertensive chronic kidney disease with stage 1 through stage 4 chronic kidney disease, or unspecified chronic kidney disease: Secondary | ICD-10-CM | POA: Diagnosis not present

## 2018-08-06 DIAGNOSIS — L03115 Cellulitis of right lower limb: Secondary | ICD-10-CM | POA: Diagnosis not present

## 2018-08-06 DIAGNOSIS — E1122 Type 2 diabetes mellitus with diabetic chronic kidney disease: Secondary | ICD-10-CM | POA: Diagnosis not present

## 2018-08-06 DIAGNOSIS — M1030 Gout due to renal impairment, unspecified site: Secondary | ICD-10-CM | POA: Diagnosis not present

## 2018-08-10 DIAGNOSIS — E1122 Type 2 diabetes mellitus with diabetic chronic kidney disease: Secondary | ICD-10-CM | POA: Diagnosis not present

## 2018-08-10 DIAGNOSIS — L03115 Cellulitis of right lower limb: Secondary | ICD-10-CM | POA: Diagnosis not present

## 2018-08-10 DIAGNOSIS — N183 Chronic kidney disease, stage 3 (moderate): Secondary | ICD-10-CM | POA: Diagnosis not present

## 2018-08-10 DIAGNOSIS — M1030 Gout due to renal impairment, unspecified site: Secondary | ICD-10-CM | POA: Diagnosis not present

## 2018-08-10 DIAGNOSIS — I129 Hypertensive chronic kidney disease with stage 1 through stage 4 chronic kidney disease, or unspecified chronic kidney disease: Secondary | ICD-10-CM | POA: Diagnosis not present

## 2018-08-10 DIAGNOSIS — B9561 Methicillin susceptible Staphylococcus aureus infection as the cause of diseases classified elsewhere: Secondary | ICD-10-CM | POA: Diagnosis not present

## 2018-08-14 ENCOUNTER — Ambulatory Visit (INDEPENDENT_AMBULATORY_CARE_PROVIDER_SITE_OTHER): Payer: Medicare Other | Admitting: Orthopaedic Surgery

## 2018-08-14 ENCOUNTER — Encounter (INDEPENDENT_AMBULATORY_CARE_PROVIDER_SITE_OTHER): Payer: Self-pay | Admitting: Orthopaedic Surgery

## 2018-08-14 DIAGNOSIS — L03115 Cellulitis of right lower limb: Secondary | ICD-10-CM

## 2018-08-14 NOTE — Progress Notes (Signed)
Post-Op Visit Note   Patient: James Roberson           Date of Birth: 07/28/1946           MRN: [id number] Visit Date: 08/14/2018 PCP: Patient, No Pcp Per   Assessment & Plan:  Chief Complaint:  Chief Complaint  Patient presents with  . Right Leg - Pain, Follow-up, Routine Post Op, Wound Check   Visit Diagnoses:  1. Cellulitis of right lower extremity     Plan: Patient is a pleasant 73 year old gentleman who presents to our clinic today 25 days status post irrigation debridement multiple right lower leg abscesses, date of surgery 07/20/2018.  He has been doing fairly well.  He has been applying wet-to-dry bandages only once daily.  No fevers or chills.  He has been on Keflex which is been causing side effects to include nausea and headaches.  He has recently been referred to an infectious disease physician at [facility] to [facility] for the first time tomorrow.  Examination of his right lower leg reveals well-healing wounds.  These are not completely approximated.  They are mostly all scabbed.  He does have a 1 x 1 cm area of eschar to the most proximal lateral wound.  No drainage anywhere.  He does have slight induration to the medial calf.  Of note, he notes 2 small abscesses which formed near 2 of the wounds.  These have since stopped draining and closed.  At this point, he will continue with wet-to-dry dressings.  He will continue with Keflex until he speaks with infectious disease doctor tomorrow who may or may not change his antibiotics.  He will follow-up with [place] in 3 weeks time for recheck.  Follow-Up Instructions: Return in about 3 weeks (around 09/04/2018).   Orders:  No orders of the defined types were placed in this encounter.  No orders of the defined types were placed in this encounter.   Imaging: No new imaging  PMFS History: Patient Active Problem List   Diagnosis Date Noted  . Leg abscess 07/22/2018  . Cellulitis of right lower extremity 07/17/2018  . Diabetes  mellitus type 2 in obese (HCC) 07/17/2018  . CKD (chronic kidney disease) stage 3, GFR 30-59 ml/min (HCC) 11/11/2014  . Cellulitis 10/30/2014  . Diabetes mellitus type 2, controlled (HCC) 10/30/2014  . Hyperlipidemia 10/30/2014  . Renal failure (ARF), acute on chronic (HCC) 10/30/2014  . Sepsis (HCC) 10/30/2014  . ULCER, LEG 09/25/2008  . DIZZINESS 09/25/2008  . DIABETES MELLITUS, TYPE II 09/23/2008  . HYPERLIPIDEMIA 09/23/2008  . GOUT 09/23/2008  . Essential hypertension 09/23/2008  . PSORIASIS, HX OF 09/23/2008   Past Medical History:  Diagnosis Date  . Diabetes mellitus without complication (HCC)   . Hyperlipidemia   . Hypertension   . Obesity     Family History  Problem Relation Age of Onset  . CAD Father     Past Surgical History:  Procedure Laterality Date  . BACK SURGERY    . I&D EXTREMITY Right 07/20/2018   Procedure: IRRIGATION AND DEBRIDEMENT  OF LEG;  Surgeon: Tarry Kos, MD;  Location: [facility] OR;  Service: Orthopedics;  Laterality: Right;  . LEG SURGERY    . LUMBAR LAMINECTOMY  1989   Social History   Occupational History  . Not on file  Tobacco Use  . Smoking status: Former Smoker    Last attempt to quit: 07/23/1966    Years since quitting: 52.0  . Smokeless tobacco: Never Used  Substance and Sexual Activity  . Alcohol use: Yes    Comment: 1 a quarter  . Drug use: Not on file  . Sexual activity: Not on file

## 2018-08-15 DIAGNOSIS — L089 Local infection of the skin and subcutaneous tissue, unspecified: Secondary | ICD-10-CM | POA: Diagnosis not present

## 2018-08-17 DIAGNOSIS — L03115 Cellulitis of right lower limb: Secondary | ICD-10-CM | POA: Diagnosis not present

## 2018-08-17 DIAGNOSIS — B9561 Methicillin susceptible Staphylococcus aureus infection as the cause of diseases classified elsewhere: Secondary | ICD-10-CM | POA: Diagnosis not present

## 2018-08-17 DIAGNOSIS — N183 Chronic kidney disease, stage 3 (moderate): Secondary | ICD-10-CM | POA: Diagnosis not present

## 2018-08-17 DIAGNOSIS — I129 Hypertensive chronic kidney disease with stage 1 through stage 4 chronic kidney disease, or unspecified chronic kidney disease: Secondary | ICD-10-CM | POA: Diagnosis not present

## 2018-08-17 DIAGNOSIS — E1122 Type 2 diabetes mellitus with diabetic chronic kidney disease: Secondary | ICD-10-CM | POA: Diagnosis not present

## 2018-08-17 DIAGNOSIS — M1030 Gout due to renal impairment, unspecified site: Secondary | ICD-10-CM | POA: Diagnosis not present

## 2018-08-27 DIAGNOSIS — E118 Type 2 diabetes mellitus with unspecified complications: Secondary | ICD-10-CM | POA: Diagnosis not present

## 2018-08-27 DIAGNOSIS — N183 Chronic kidney disease, stage 3 (moderate): Secondary | ICD-10-CM | POA: Diagnosis not present

## 2018-08-27 DIAGNOSIS — L403 Pustulosis palmaris et plantaris: Secondary | ICD-10-CM | POA: Diagnosis not present

## 2018-08-27 DIAGNOSIS — I10 Essential (primary) hypertension: Secondary | ICD-10-CM | POA: Diagnosis not present

## 2018-08-27 DIAGNOSIS — D631 Anemia in chronic kidney disease: Secondary | ICD-10-CM | POA: Diagnosis not present

## 2018-08-31 DIAGNOSIS — L03115 Cellulitis of right lower limb: Secondary | ICD-10-CM | POA: Diagnosis not present

## 2018-08-31 DIAGNOSIS — E1122 Type 2 diabetes mellitus with diabetic chronic kidney disease: Secondary | ICD-10-CM | POA: Diagnosis not present

## 2018-08-31 DIAGNOSIS — I129 Hypertensive chronic kidney disease with stage 1 through stage 4 chronic kidney disease, or unspecified chronic kidney disease: Secondary | ICD-10-CM | POA: Diagnosis not present

## 2018-08-31 DIAGNOSIS — B9561 Methicillin susceptible Staphylococcus aureus infection as the cause of diseases classified elsewhere: Secondary | ICD-10-CM | POA: Diagnosis not present

## 2018-09-04 ENCOUNTER — Ambulatory Visit (INDEPENDENT_AMBULATORY_CARE_PROVIDER_SITE_OTHER): Payer: Medicare Other | Admitting: Orthopaedic Surgery

## 2018-09-04 ENCOUNTER — Encounter (INDEPENDENT_AMBULATORY_CARE_PROVIDER_SITE_OTHER): Payer: Self-pay | Admitting: Orthopaedic Surgery

## 2018-09-04 DIAGNOSIS — L02415 Cutaneous abscess of right lower limb: Secondary | ICD-10-CM

## 2018-09-04 DIAGNOSIS — L03115 Cellulitis of right lower limb: Secondary | ICD-10-CM

## 2018-09-04 DIAGNOSIS — L02419 Cutaneous abscess of limb, unspecified: Secondary | ICD-10-CM

## 2018-09-04 NOTE — Progress Notes (Signed)
   Post-Op Visit Note   Patient: James Roberson           Date of Birth: 09-08-1945           MRN: [id number] Visit Date: 09/04/2018 PCP: Patient, No Pcp Per   Assessment & Plan:  Chief Complaint:  Chief Complaint  Patient presents with  . Right Leg - Pain   Visit Diagnoses:  1. Cellulitis of right lower extremity   2. Leg abscess     Plan: James Roberson is 6 weeks status post I&D of multiple leg abscesses.  Overall he is doing well.  He has finished his antibiotic regimen.  He has seen infectious disease at [facility] and they are currently awaiting more test.  Overall he is doing well.  His physical exam is completely unremarkable at this point.  The surgical scars are fully healed.  No signs of infection.  From my standpoint we will see him back as needed.  Questions encouraged and answered.  Follow-Up Instructions: Return if symptoms worsen or fail to improve.   Orders:  No orders of the defined types were placed in this encounter.  No orders of the defined types were placed in this encounter.   Imaging: No results found.  PMFS History: Patient Active Problem List   Diagnosis Date Noted  . Leg abscess 07/22/2018  . Cellulitis of right lower extremity 07/17/2018  . Diabetes mellitus type 2 in obese (HCC) 07/17/2018  . CKD (chronic kidney disease) stage 3, GFR 30-59 ml/min (HCC) 11/11/2014  . Cellulitis 10/30/2014  . Diabetes mellitus type 2, controlled (HCC) 10/30/2014  . Hyperlipidemia 10/30/2014  . Renal failure (ARF), acute on chronic (HCC) 10/30/2014  . Sepsis (HCC) 10/30/2014  . ULCER, LEG 09/25/2008  . DIZZINESS 09/25/2008  . DIABETES MELLITUS, TYPE II 09/23/2008  . HYPERLIPIDEMIA 09/23/2008  . GOUT 09/23/2008  . Essential hypertension 09/23/2008  . PSORIASIS, HX OF 09/23/2008   Past Medical History:  Diagnosis Date  . Diabetes mellitus without complication (HCC)   . Hyperlipidemia   . Hypertension   . Obesity     Family History  Problem Relation  Age of Onset  . CAD Father     Past Surgical History:  Procedure Laterality Date  . BACK SURGERY    . I&D EXTREMITY Right 07/20/2018   Procedure: IRRIGATION AND DEBRIDEMENT  OF LEG;  Surgeon: Tarry Kos, MD;  Location: [facility] OR;  Service: Orthopedics;  Laterality: Right;  . LEG SURGERY    . LUMBAR LAMINECTOMY  1989   Social History   Occupational History  . Not on file  Tobacco Use  . Smoking status: Former Smoker    Last attempt to quit: 07/23/1966    Years since quitting: 52.1  . Smokeless tobacco: Never Used  Substance and Sexual Activity  . Alcohol use: Yes    Comment: 1 a quarter  . Drug use: Not on file  . Sexual activity: Not on file

## 2018-09-12 DIAGNOSIS — L089 Local infection of the skin and subcutaneous tissue, unspecified: Secondary | ICD-10-CM | POA: Diagnosis not present

## 2018-09-12 DIAGNOSIS — B9561 Methicillin susceptible Staphylococcus aureus infection as the cause of diseases classified elsewhere: Secondary | ICD-10-CM | POA: Diagnosis not present

## 2019-01-02 DIAGNOSIS — E119 Type 2 diabetes mellitus without complications: Secondary | ICD-10-CM | POA: Diagnosis not present

## 2019-02-15 DIAGNOSIS — I10 Essential (primary) hypertension: Secondary | ICD-10-CM | POA: Diagnosis not present

## 2019-02-15 DIAGNOSIS — Z1159 Encounter for screening for other viral diseases: Secondary | ICD-10-CM | POA: Diagnosis not present

## 2019-02-15 DIAGNOSIS — D631 Anemia in chronic kidney disease: Secondary | ICD-10-CM | POA: Diagnosis not present

## 2019-02-15 DIAGNOSIS — E118 Type 2 diabetes mellitus with unspecified complications: Secondary | ICD-10-CM | POA: Diagnosis not present

## 2019-02-15 DIAGNOSIS — Z125 Encounter for screening for malignant neoplasm of prostate: Secondary | ICD-10-CM | POA: Diagnosis not present

## 2019-02-20 DIAGNOSIS — N183 Chronic kidney disease, stage 3 (moderate): Secondary | ICD-10-CM | POA: Diagnosis not present

## 2019-02-20 DIAGNOSIS — Z6830 Body mass index (BMI) 30.0-30.9, adult: Secondary | ICD-10-CM | POA: Diagnosis not present

## 2019-02-20 DIAGNOSIS — B353 Tinea pedis: Secondary | ICD-10-CM | POA: Diagnosis not present

## 2019-02-20 DIAGNOSIS — N2581 Secondary hyperparathyroidism of renal origin: Secondary | ICD-10-CM | POA: Diagnosis not present

## 2019-02-20 DIAGNOSIS — I10 Essential (primary) hypertension: Secondary | ICD-10-CM | POA: Diagnosis not present

## 2019-02-20 DIAGNOSIS — E782 Mixed hyperlipidemia: Secondary | ICD-10-CM | POA: Diagnosis not present

## 2019-02-20 DIAGNOSIS — K219 Gastro-esophageal reflux disease without esophagitis: Secondary | ICD-10-CM | POA: Diagnosis not present

## 2019-02-20 DIAGNOSIS — M1A0710 Idiopathic chronic gout, right ankle and foot, without tophus (tophi): Secondary | ICD-10-CM | POA: Diagnosis not present

## 2019-02-20 DIAGNOSIS — E118 Type 2 diabetes mellitus with unspecified complications: Secondary | ICD-10-CM | POA: Diagnosis not present

## 2019-02-20 DIAGNOSIS — Z0000 Encounter for general adult medical examination without abnormal findings: Secondary | ICD-10-CM | POA: Diagnosis not present

## 2019-02-20 DIAGNOSIS — D631 Anemia in chronic kidney disease: Secondary | ICD-10-CM | POA: Diagnosis not present

## 2019-02-20 DIAGNOSIS — B352 Tinea manuum: Secondary | ICD-10-CM | POA: Diagnosis not present

## 2019-05-06 DIAGNOSIS — Z23 Encounter for immunization: Secondary | ICD-10-CM | POA: Diagnosis not present

## 2019-05-27 DIAGNOSIS — N189 Chronic kidney disease, unspecified: Secondary | ICD-10-CM | POA: Diagnosis not present

## 2019-05-27 DIAGNOSIS — N2581 Secondary hyperparathyroidism of renal origin: Secondary | ICD-10-CM | POA: Diagnosis not present

## 2019-05-27 DIAGNOSIS — N183 Chronic kidney disease, stage 3 unspecified: Secondary | ICD-10-CM | POA: Diagnosis not present

## 2019-05-27 DIAGNOSIS — E1122 Type 2 diabetes mellitus with diabetic chronic kidney disease: Secondary | ICD-10-CM | POA: Diagnosis not present

## 2019-05-27 DIAGNOSIS — D631 Anemia in chronic kidney disease: Secondary | ICD-10-CM | POA: Diagnosis not present

## 2019-05-27 DIAGNOSIS — I129 Hypertensive chronic kidney disease with stage 1 through stage 4 chronic kidney disease, or unspecified chronic kidney disease: Secondary | ICD-10-CM | POA: Diagnosis not present

## 2019-05-27 DIAGNOSIS — M109 Gout, unspecified: Secondary | ICD-10-CM | POA: Diagnosis not present

## 2019-05-28 DIAGNOSIS — N183 Chronic kidney disease, stage 3 unspecified: Secondary | ICD-10-CM | POA: Diagnosis not present

## 2019-05-28 DIAGNOSIS — E118 Type 2 diabetes mellitus with unspecified complications: Secondary | ICD-10-CM | POA: Diagnosis not present

## 2019-05-28 DIAGNOSIS — L403 Pustulosis palmaris et plantaris: Secondary | ICD-10-CM | POA: Diagnosis not present

## 2019-05-28 DIAGNOSIS — I10 Essential (primary) hypertension: Secondary | ICD-10-CM | POA: Diagnosis not present

## 2019-05-28 DIAGNOSIS — D631 Anemia in chronic kidney disease: Secondary | ICD-10-CM | POA: Diagnosis not present

## 2019-07-03 ENCOUNTER — Other Ambulatory Visit: Payer: Self-pay

## 2019-10-30 DIAGNOSIS — R197 Diarrhea, unspecified: Secondary | ICD-10-CM | POA: Diagnosis not present

## 2019-10-30 DIAGNOSIS — G4700 Insomnia, unspecified: Secondary | ICD-10-CM | POA: Diagnosis not present

## 2019-11-12 DIAGNOSIS — G4700 Insomnia, unspecified: Secondary | ICD-10-CM | POA: Diagnosis not present

## 2019-11-12 DIAGNOSIS — E559 Vitamin D deficiency, unspecified: Secondary | ICD-10-CM | POA: Diagnosis not present

## 2019-11-12 DIAGNOSIS — R197 Diarrhea, unspecified: Secondary | ICD-10-CM | POA: Diagnosis not present

## 2019-11-12 DIAGNOSIS — Z79899 Other long term (current) drug therapy: Secondary | ICD-10-CM | POA: Diagnosis not present

## 2019-11-12 DIAGNOSIS — R110 Nausea: Secondary | ICD-10-CM | POA: Diagnosis not present

## 2019-11-12 DIAGNOSIS — R12 Heartburn: Secondary | ICD-10-CM | POA: Diagnosis not present

## 2019-11-12 DIAGNOSIS — E1142 Type 2 diabetes mellitus with diabetic polyneuropathy: Secondary | ICD-10-CM | POA: Diagnosis not present

## 2019-11-12 DIAGNOSIS — E118 Type 2 diabetes mellitus with unspecified complications: Secondary | ICD-10-CM | POA: Diagnosis not present

## 2019-11-12 DIAGNOSIS — I10 Essential (primary) hypertension: Secondary | ICD-10-CM | POA: Diagnosis not present

## 2019-11-15 DIAGNOSIS — R197 Diarrhea, unspecified: Secondary | ICD-10-CM | POA: Diagnosis not present

## 2019-11-21 DIAGNOSIS — R0789 Other chest pain: Secondary | ICD-10-CM | POA: Diagnosis not present

## 2019-11-21 DIAGNOSIS — R197 Diarrhea, unspecified: Secondary | ICD-10-CM | POA: Diagnosis not present

## 2019-11-21 DIAGNOSIS — I10 Essential (primary) hypertension: Secondary | ICD-10-CM | POA: Diagnosis not present

## 2019-11-26 DIAGNOSIS — Z6830 Body mass index (BMI) 30.0-30.9, adult: Secondary | ICD-10-CM | POA: Diagnosis not present

## 2019-11-26 DIAGNOSIS — N183 Chronic kidney disease, stage 3 unspecified: Secondary | ICD-10-CM | POA: Diagnosis not present

## 2019-11-26 DIAGNOSIS — E119 Type 2 diabetes mellitus without complications: Secondary | ICD-10-CM | POA: Diagnosis not present

## 2019-11-26 DIAGNOSIS — E782 Mixed hyperlipidemia: Secondary | ICD-10-CM | POA: Diagnosis not present

## 2019-11-26 DIAGNOSIS — E1142 Type 2 diabetes mellitus with diabetic polyneuropathy: Secondary | ICD-10-CM | POA: Diagnosis not present

## 2019-11-26 DIAGNOSIS — E1122 Type 2 diabetes mellitus with diabetic chronic kidney disease: Secondary | ICD-10-CM | POA: Diagnosis not present

## 2019-11-26 DIAGNOSIS — D631 Anemia in chronic kidney disease: Secondary | ICD-10-CM | POA: Diagnosis not present

## 2019-11-26 DIAGNOSIS — I10 Essential (primary) hypertension: Secondary | ICD-10-CM | POA: Diagnosis not present

## 2019-12-10 DIAGNOSIS — E119 Type 2 diabetes mellitus without complications: Secondary | ICD-10-CM | POA: Diagnosis not present

## 2019-12-10 DIAGNOSIS — H35373 Puckering of macula, bilateral: Secondary | ICD-10-CM | POA: Diagnosis not present

## 2019-12-10 DIAGNOSIS — H5203 Hypermetropia, bilateral: Secondary | ICD-10-CM | POA: Diagnosis not present

## 2019-12-12 DIAGNOSIS — I10 Essential (primary) hypertension: Secondary | ICD-10-CM | POA: Diagnosis not present

## 2019-12-12 DIAGNOSIS — K219 Gastro-esophageal reflux disease without esophagitis: Secondary | ICD-10-CM | POA: Diagnosis not present

## 2019-12-30 ENCOUNTER — Encounter (HOSPITAL_BASED_OUTPATIENT_CLINIC_OR_DEPARTMENT_OTHER): Payer: Self-pay | Admitting: *Deleted

## 2019-12-30 ENCOUNTER — Emergency Department (HOSPITAL_BASED_OUTPATIENT_CLINIC_OR_DEPARTMENT_OTHER): Payer: Medicare HMO

## 2019-12-30 ENCOUNTER — Emergency Department (HOSPITAL_BASED_OUTPATIENT_CLINIC_OR_DEPARTMENT_OTHER)
Admission: EM | Admit: 2019-12-30 | Discharge: 2019-12-30 | Disposition: A | Discharge status: Home / Self Care | Payer: Medicare HMO | Attending: Emergency Medicine | Admitting: Emergency Medicine

## 2019-12-30 ENCOUNTER — Other Ambulatory Visit: Payer: Self-pay

## 2019-12-30 DIAGNOSIS — N183 Chronic kidney disease, stage 3 unspecified: Secondary | ICD-10-CM | POA: Insufficient documentation

## 2019-12-30 DIAGNOSIS — R0789 Other chest pain: Secondary | ICD-10-CM | POA: Diagnosis not present

## 2019-12-30 DIAGNOSIS — Z794 Long term (current) use of insulin: Secondary | ICD-10-CM | POA: Diagnosis not present

## 2019-12-30 DIAGNOSIS — J069 Acute upper respiratory infection, unspecified: Secondary | ICD-10-CM | POA: Diagnosis not present

## 2019-12-30 DIAGNOSIS — E1122 Type 2 diabetes mellitus with diabetic chronic kidney disease: Secondary | ICD-10-CM | POA: Diagnosis not present

## 2019-12-30 DIAGNOSIS — Z87891 Personal history of nicotine dependence: Secondary | ICD-10-CM | POA: Insufficient documentation

## 2019-12-30 DIAGNOSIS — R05 Cough: Secondary | ICD-10-CM | POA: Insufficient documentation

## 2019-12-30 DIAGNOSIS — R630 Anorexia: Secondary | ICD-10-CM | POA: Insufficient documentation

## 2019-12-30 DIAGNOSIS — Z20822 Contact with and (suspected) exposure to covid-19: Secondary | ICD-10-CM | POA: Insufficient documentation

## 2019-12-30 DIAGNOSIS — R1084 Generalized abdominal pain: Secondary | ICD-10-CM | POA: Diagnosis not present

## 2019-12-30 DIAGNOSIS — I129 Hypertensive chronic kidney disease with stage 1 through stage 4 chronic kidney disease, or unspecified chronic kidney disease: Secondary | ICD-10-CM | POA: Diagnosis not present

## 2019-12-30 DIAGNOSIS — Z79899 Other long term (current) drug therapy: Secondary | ICD-10-CM | POA: Insufficient documentation

## 2019-12-30 DIAGNOSIS — R0602 Shortness of breath: Secondary | ICD-10-CM | POA: Diagnosis not present

## 2019-12-30 LAB — BRAIN NATRIURETIC PEPTIDE: B Natriuretic Peptide: 33.4 pg/mL (ref 0.0–100.0)

## 2019-12-30 LAB — COMPREHENSIVE METABOLIC PANEL
ALT: 49 U/L — ABNORMAL HIGH (ref 0–44)
AST: 30 U/L (ref 15–41)
Albumin: 3.5 g/dL (ref 3.5–5.0)
Alkaline Phosphatase: 117 U/L (ref 38–126)
Anion gap: 18 — ABNORMAL HIGH (ref 5–15)
BUN: 29 mg/dL — ABNORMAL HIGH (ref 8–23)
CO2: 21 mmol/L — ABNORMAL LOW (ref 22–32)
Calcium: 10 mg/dL (ref 8.9–10.3)
Chloride: 96 mmol/L — ABNORMAL LOW (ref 98–111)
Creatinine, Ser: 1.51 mg/dL — ABNORMAL HIGH (ref 0.61–1.24)
GFR calc Af Amer: 52 mL/min — ABNORMAL LOW (ref >60)
GFR calc non Af Amer: 45 mL/min — ABNORMAL LOW (ref >60)
Glucose, Bld: 220 mg/dL — ABNORMAL HIGH (ref 70–99)
Potassium: 3.8 mmol/L (ref 3.5–5.1)
Sodium: 135 mmol/L (ref 135–145)
Total Bilirubin: 0.7 mg/dL (ref 0.3–1.2)
Total Protein: 8.7 g/dL — ABNORMAL HIGH (ref 6.5–8.1)

## 2019-12-30 LAB — CBC WITH DIFFERENTIAL/PLATELET
Abs Immature Granulocytes: 0.37 10*3/uL — ABNORMAL HIGH (ref 0.00–0.07)
Basophils Absolute: 0.1 10*3/uL (ref 0.0–0.1)
Basophils Relative: 1 %
Eosinophils Absolute: 0.1 10*3/uL (ref 0.0–0.5)
Eosinophils Relative: 1 %
HCT: 43.8 % (ref 39.0–52.0)
Hemoglobin: 14.6 g/dL (ref 13.0–17.0)
Immature Granulocytes: 4 %
Lymphocytes Relative: 21 %
Lymphs Abs: 1.8 10*3/uL (ref 0.7–4.0)
MCH: 28.1 pg (ref 26.0–34.0)
MCHC: 33.3 g/dL (ref 30.0–36.0)
MCV: 84.2 fL (ref 80.0–100.0)
Monocytes Absolute: 0.8 10*3/uL (ref 0.1–1.0)
Monocytes Relative: 9 %
Neutro Abs: 5.7 10*3/uL (ref 1.7–7.7)
Neutrophils Relative %: 64 %
Platelets: 461 10*3/uL — ABNORMAL HIGH (ref 150–400)
RBC: 5.2 MIL/uL (ref 4.22–5.81)
RDW: 12.5 % (ref 11.5–15.5)
WBC: 8.9 10*3/uL (ref 4.0–10.5)
nRBC: 0 % (ref 0.0–0.2)

## 2019-12-30 LAB — URINALYSIS, ROUTINE W REFLEX MICROSCOPIC
Glucose, UA: NEGATIVE mg/dL
Ketones, ur: 15 mg/dL — AB
Leukocytes,Ua: NEGATIVE
Nitrite: NEGATIVE
Protein, ur: 30 mg/dL — AB
Specific Gravity, Urine: >1.03 — ABNORMAL HIGH (ref 1.005–1.030)
pH: 5.5 (ref 5.0–8.0)

## 2019-12-30 LAB — TROPONIN I (HIGH SENSITIVITY)
Troponin I (High Sensitivity): 5 ng/L (ref <18)
Troponin I (High Sensitivity): 5 ng/L (ref <18)

## 2019-12-30 LAB — SARS CORONAVIRUS 2 BY RT PCR (HOSPITAL ORDER, PERFORMED IN ~~LOC~~ HOSPITAL LAB): SARS Coronavirus 2: NEGATIVE

## 2019-12-30 LAB — URINALYSIS, MICROSCOPIC (REFLEX): WBC, UA: NONE SEEN WBC/hpf (ref 0–5)

## 2019-12-30 LAB — CBG MONITORING, ED: Glucose-Capillary: 202 mg/dL — ABNORMAL HIGH (ref 70–99)

## 2019-12-30 MED ORDER — ONDANSETRON 4 MG PO TBDP
4.0000 mg | ORAL_TABLET | Freq: Three times a day (TID) | ORAL | 0 refills | Status: DC | PRN
Start: 2019-12-30 — End: 2021-06-08

## 2019-12-30 MED ORDER — SODIUM CHLORIDE 0.9 % IV BOLUS
1000.0000 mL | Freq: Once | INTRAVENOUS | Status: AC
  Administered 2019-12-30: 1000 mL via INTRAVENOUS

## 2019-12-30 MED ORDER — ONDANSETRON HCL 4 MG/2ML IJ SOLN
4.0000 mg | Freq: Once | INTRAMUSCULAR | Status: AC
  Administered 2019-12-30: 4 mg via INTRAVENOUS
  Filled 2019-12-30: qty 2

## 2019-12-30 MED ORDER — DOXYCYCLINE HYCLATE 100 MG PO CAPS: 100.0000 mg | ORAL_CAPSULE | Freq: Two times a day (BID) | ORAL | 0 refills | Status: AC

## 2019-12-30 MED ORDER — DOXYCYCLINE HYCLATE 100 MG PO TABS
100.0000 mg | ORAL_TABLET | Freq: Once | ORAL | Status: AC
  Administered 2019-12-30: 100 mg via ORAL
  Filled 2019-12-30: qty 1

## 2019-12-30 NOTE — Discharge Instructions (Signed)
As we discussed, your chest x-ray and lab work looked reassuring.  Your Covid test was negative.  We will plan to treat with antibiotics given concerns for possible developing infection.  Take antibiotics as directed. Please take all of your antibiotics until finished.  Follow-up with your primary care doctor in the next 2 to 4 days.  Return to emergency department for any worsening difficulty breathing, abdominal pain, vomiting or any other worsening or concerning symptoms.

## 2019-12-30 NOTE — ED Notes (Signed)
ED Provider at bedside. 

## 2019-12-30 NOTE — ED Triage Notes (Addendum)
Pt c/o SOb and nausea x 5 days Family at wife at home with same

## 2019-12-30 NOTE — ED Provider Notes (Signed)
[facility] EMERGENCY DEPARTMENT Provider Note   CSN: [id number] Arrival date & time: 12/30/19  1741     History Chief Complaint  Patient presents with   Shortness of Breath    James Roberson is a 74 y.o. male with PMH/p DM, HLD, HTN, Obesity who presents for evaluation of SOB, cough, headache, abdominal pain, decreased appetite for 5 days. He reports that several members in his household have had similar symptoms.  He reports a low grade fever of 98. He states that last week when they started having symptoms, they got tested for Covid and it was negative.  He does report that he has gotten all of his Covid vaccines.  He reports that over the last few days, he has had some heaviness on his chest that has been constant in nature.  He also reports he has had shortness of breath.  He feels like this is worse with exertion and he cannot walk more than a few steps without getting winded.  He has had cough that is productive of green phlegm.  He states he had 1 day of vomiting.  Emesis was nonbloody, nonbilious.  He states he has had decreased appetite.  He reports generalized abdominal pain with no focal point.  He does report he has a history of diabetes and states he has been compliant with his medications.  He denies any dysuria, hematuria. He denies any exogenous hormone use, recent immobilization, prior history of DVT/PE, recent surgery, leg swelling, or long travel.  The history is provided by the patient.       Past Medical History:  Diagnosis Date   Diabetes mellitus without complication (HCC)    Hyperlipidemia    Hypertension    Obesity    Psoriasis     Patient Active Problem List   Diagnosis Date Noted   Leg abscess 07/22/2018   Cellulitis of right lower extremity 07/17/2018   Diabetes mellitus type 2 in obese (HCC) 07/17/2018   CKD (chronic kidney disease) stage 3, GFR 30-59 ml/min (HCC) 11/11/2014   Cellulitis 10/30/2014   Diabetes mellitus type 2,  controlled (HCC) 10/30/2014   Hyperlipidemia 10/30/2014   Renal failure (ARF), acute on chronic (HCC) 10/30/2014   Sepsis (HCC) 10/30/2014   ULCER, LEG 09/25/2008   DIZZINESS 09/25/2008   DIABETES MELLITUS, TYPE II 09/23/2008   HYPERLIPIDEMIA 09/23/2008   GOUT 09/23/2008   Essential hypertension 09/23/2008   PSORIASIS, HX OF 09/23/2008    Past Surgical History:  Procedure Laterality Date   BACK SURGERY     I & D EXTREMITY Right 07/20/2018   Procedure: IRRIGATION AND DEBRIDEMENT  OF LEG;  Surgeon: Tarry Kos, MD;  Location: [facility] OR;  Service: Orthopedics;  Laterality: Right;   LEG SURGERY     LUMBAR LAMINECTOMY  1989       Family History  Problem Relation Age of Onset   CAD Father     Social History   Tobacco Use   Smoking status: Former Smoker    Quit date: 07/23/1966    Years since quitting: 53.4   Smokeless tobacco: Never Used  Substance Use Topics   Alcohol use: Yes    Comment: 1 a quarter   Drug use: Not on file    Home Medications Prior to Admission medications   Medication Sig Start Date End Date Taking? Authorizing Provider  atorvastatin (LIPITOR) 40 MG tablet Take 40 mg by mouth at bedtime.     [provider]  buPROPion [facility]  XL) 150 MG 24 hr tablet Take 150 mg by mouth daily.  10/21/14   [provider]  celecoxib (CELEBREX) 100 MG capsule Take 100 mg by mouth at bedtime.  05/15/18   [provider]  cephALEXin (KEFLEX) 500 MG capsule Take 1 capsule (500 mg total) by mouth every 8 (eight) hours. 07/27/18   Cristie Hem, PA-C  cyanocobalamin 500 MCG tablet Take 500 mcg by mouth every other day.     [provider]  doxycycline (VIBRAMYCIN) 100 MG capsule Take 1 capsule (100 mg total) by mouth 2 (two) times daily for 7 days. 12/30/19 01/06/20  Maxwell Caul, PA-C  Emollient (LUBRIDERM SERIOUSLY SENSITIVE) LOTN Apply 1 application topically 2 (two) times daily. 11/03/14   Mikhail, Nita Sells, DO   gabapentin (NEURONTIN) 300 MG capsule Take 600 mg by mouth at bedtime.    [provider]  HYDROcodone-acetaminophen (NORCO) 5-325 MG tablet Take 1 tablet by mouth 2 (two) times daily as needed for moderate pain. 07/27/18   Cristie Hem, PA-C  insulin detemir (LEVEMIR) 100 UNIT/ML injection Inject 0.18 mLs (18 Units total) into the skin at bedtime. 11/03/14   Mikhail, Nita Sells, DO  Lidocaine 5 % CREA Apply 1 application topically See admin instructions. Apply to affected area 3 times a day as directed 07/11/18   [provider]  lisinopril (PRINIVIL,ZESTRIL) 5 MG tablet Take 5 mg by mouth at bedtime.    [provider]  lisinopril (ZESTRIL) 40 MG tablet Take 40 mg by mouth daily. 11/25/19   [provider]  metFORMIN (GLUCOPHAGE-XR) 500 MG 24 hr tablet Take 1,000 mg by mouth 2 (two) times daily.    [provider]  ondansetron (ZOFRAN ODT) 4 MG disintegrating tablet Take 1 tablet (4 mg total) by mouth every 8 (eight) hours as needed for nausea or vomiting. 12/30/19   Maxwell Caul, PA-C  oxyCODONE-acetaminophen (PERCOCET/ROXICET) 5-325 MG tablet Take 1-2 tablets by mouth every 6 (six) hours as needed for severe pain. 07/24/18   Ghimire, Werner Lean, MD  saccharomyces boulardii (FLORASTOR) 250 MG capsule Take 1 capsule (250 mg total) by mouth 2 (two) times daily. 11/03/14   Edsel Petrin, DO  tamsulosin (FLOMAX) 0.4 MG CAPS capsule Take 1 capsule (0.4 mg total) by mouth daily. 12/25/16   Elson Areas, PA-C  TRULICITY 1.5 MG/0.5ML SOPN Inject 0.5 mLs into the skin every Friday. 05/05/18   [provider]  zaleplon (SONATA) 5 MG capsule Take 5-10 mg by mouth at bedtime as needed. 10/30/19   [provider]    Allergies    Avelox abc [moxifloxacin], Tape, Ambien cr [zolpidem tartrate er], Zolpidem, and Other  Review of Systems   Review of Systems  Constitutional: Positive for activity change, appetite change, fatigue and fever.   Respiratory: Positive for cough and shortness of breath.   Cardiovascular: Negative for chest pain.  Gastrointestinal: Positive for abdominal pain. Negative for nausea and vomiting.  Genitourinary: Negative for dysuria and hematuria.  Neurological: Negative for headaches.  All other systems reviewed and are negative.   Physical Exam Updated Vital Signs BP (!) 153/85 (BP Location: Right Arm)    Pulse 80    Temp 98.4 F (36.9 C) (Oral)    Resp (!) 28    Ht 5\' 9"  (1.753 m)    Wt 88 kg    SpO2 96%    BMI 28.65 kg/m   Physical Exam Vitals and nursing note reviewed.  Constitutional:  Appearance: Normal appearance. He is well-developed.  HENT:     Head: Normocephalic and atraumatic.  Eyes:     General: Lids are normal.     Conjunctiva/sclera: Conjunctivae normal.     Pupils: Pupils are equal, round, and reactive to light.  Cardiovascular:     Rate and Rhythm: Normal rate and regular rhythm.     Pulses: Normal pulses.     Heart sounds: Normal heart sounds. No murmur. No friction rub. No gallop.   Pulmonary:     Effort: Pulmonary effort is normal. Tachypnea present.     Breath sounds: Rales present.     Comments: Rales noted in the left lower lung bases.  He has increased work of breathing or tachypnea noted.  He is able to speak in medium sentences.  No wheezing. Abdominal:     Palpations: Abdomen is soft. Abdomen is not rigid.     Tenderness: There is generalized abdominal tenderness. There is no guarding.     Comments: Abdomen soft, nondistended.  Generalized tenderness with no focal point.  No rigidity, guarding.  Musculoskeletal:        General: Normal range of motion.     Cervical back: Full passive range of motion without pain.  Skin:    General: Skin is warm and dry.     Capillary Refill: Capillary refill takes less than 2 seconds.  Neurological:     Mental Status: He is alert and oriented to person, place, and time.  Psychiatric:        Speech: Speech normal.      ED Results / Procedures / Treatments   Labs (all labs ordered are listed, but only abnormal results are displayed) Labs Reviewed  COMPREHENSIVE METABOLIC PANEL - Abnormal; Notable for the following components:      Result Value   Chloride 96 (*)    CO2 21 (*)    Glucose, Bld 220 (*)    BUN 29 (*)    Creatinine, Ser 1.51 (*)    Total Protein 8.7 (*)    ALT 49 (*)    GFR calc non Af Amer 45 (*)    GFR calc Af Amer 52 (*)    Anion gap 18 (*)    All other components within normal limits  CBC WITH DIFFERENTIAL/PLATELET - Abnormal; Notable for the following components:   Platelets 461 (*)    Abs Immature Granulocytes 0.37 (*)    All other components within normal limits  URINALYSIS, ROUTINE W REFLEX MICROSCOPIC - Abnormal; Notable for the following components:   Specific Gravity, Urine >1.030 (*)    Hgb urine dipstick SMALL (*)    Bilirubin Urine SMALL (*)    Ketones, ur 15 (*)    Protein, ur 30 (*)    All other components within normal limits  URINALYSIS, MICROSCOPIC (REFLEX) - Abnormal; Notable for the following components:   Bacteria, UA RARE (*)    All other components within normal limits  CBG MONITORING, ED - Abnormal; Notable for the following components:   Glucose-Capillary 202 (*)    All other components within normal limits  SARS CORONAVIRUS 2 BY RT PCR (HOSPITAL ORDER, PERFORMED IN [place] [facility])  BRAIN NATRIURETIC PEPTIDE  TROPONIN I (HIGH SENSITIVITY)  TROPONIN I (HIGH SENSITIVITY)    EKG EKG Interpretation  Date/Time:  Monday Dec 30 2019 17:58:54 EDT Ventricular Rate:  93 PR Interval:    QRS Duration: 90 QT Interval:  337 QTC Calculation: 420 R Axis:  76 Text Interpretation: Sinus rhythm Atrial premature complex Low voltage, precordial leads No significant change since last tracing Confirmed by Blanchie Dessert [id number]) on 12/30/2019 6:27:38 PM   Radiology DG Chest Portable 1 View  Result Date: 12/30/2019 CLINICAL DATA:  Cough,  shortness of breath. EXAM: PORTABLE CHEST 1 VIEW COMPARISON:  November 21, 2019. FINDINGS: The heart size and mediastinal contours are within normal limits. Both lungs are clear. No pneumothorax or pleural effusion is noted. The visualized skeletal structures are unremarkable. IMPRESSION: No active disease. Electronically Signed   By: Marijo Conception M.D.   On: 12/30/2019 18:44    Procedures Procedures (including critical care time)  Medications Ordered in ED Medications  sodium chloride 0.9 % bolus 1,000 mL ( Intravenous Stopped 12/30/19 2003)  ondansetron (ZOFRAN) injection 4 mg (4 mg Intravenous Given 12/30/19 1851)  doxycycline (VIBRA-TABS) tablet 100 mg (100 mg Oral Given 12/30/19 2049)    ED Course  I have reviewed the triage vital signs and the nursing notes.  Pertinent labs & imaging results that were available during my care of the patient were reviewed by me and considered in my medical decision making (see chart for details).    MDM Rules/Calculators/A&P                      74 year old male who presents for evaluation of fatigue, generalized body aches, generalized abdominal pain, shortness of breath, chest heaviness.  He reports that family at home has been sick with same symptoms.  Felt like today, the shortness of breath was getting worse.  Reports he has had some decreased appetite.  He had one episode of vomiting few days ago but none since.  He states he is just not wanted to feel like eating.  He did get his Covid vaccine.  He reports others in his house have been tested for Covid and it was negative.  On initial arrival, he is afebrile, nontoxic-appearing.  Vital signs are stable.  On exam, he is generalized tenderness noted to the abdomen no focal point.  He does have some rales noted left lower lung bases.  Concern for infectious etiology versus dehydration.  History/physical exam does not sound concerning for ACS etiology.  Doubt PE.  We will plan to check labs, chest  x-ray.  Troponin negative.  BMP is unremarkable.  CBC shows no leukocytosis or anemia.  CMP shows BUN of 29, creatinine of 1.51.  This is consistent with previous baseline.  Anion gap of 18.  Likely from not eating or drinking.  CXR shows no active disease.  Covid negative.  Patient ambulated in the ED while maintaining O2 sats above 93%.  At this time, consider viral source though he does have some questionable rales so we will plan to start him on doxycycline.  Repeat abdominal exam is improved.  He has not had any vomiting here in the ED.  Additionally, do not suspect his work-up to be consistent with ACS etiology.  At this time, patient exhibits no emergent life-threatening condition that require further evaluation in ED or admission. Patient had ample opportunity for questions and discussion. All patient's questions were answered with full understanding. Strict return precautions discussed. Patient expresses understanding and agreement to plan.   Portions of this note were generated with [employer]. Dictation errors may occur despite best attempts at proofreading.  Final Clinical Impression(s) / ED Diagnoses Final diagnoses:  URI with cough and congestion    Rx / DC Orders  ED Discharge Orders         Ordered    ondansetron (ZOFRAN ODT) 4 MG disintegrating tablet  Every 8 hours PRN     12/30/19 2042    doxycycline (VIBRAMYCIN) 100 MG capsule  2 times daily     12/30/19 2042           Rosana HoesLayden, Shanetra Blumenstock A, PA-C 12/30/19 2257    Gwyneth SproutPlunkett, Whitney, MD 12/31/19 2045

## 2019-12-30 NOTE — ED Notes (Signed)
PT ambulated with steady gait and no assistance. PTs O2 remained above 93%

## 2020-01-04 IMAGING — MR MR [PERSON_NAME] LOW W/O CM*R*
4 of 5 series · 19 of 40 positions shown · non-contrast
Comparison: Plain films right lower leg 07/17/2018.

CLINICAL DATA: Right lower leg pain and swelling over the past 2
weeks which worsened over the past 2 days. The patient is diabetic.

EXAM:
MRI OF LOWER RIGHT EXTREMITY WITHOUT CONTRAST
TECHNIQUE: Multiplanar, multisequence MR imaging of the right lower leg was
performed. No intravenous contrast was administered.

[Series 2: STIR · coronal · 4.0mm · 0.86mm/px · 3 of 19 slices shown]
[im 1/19]
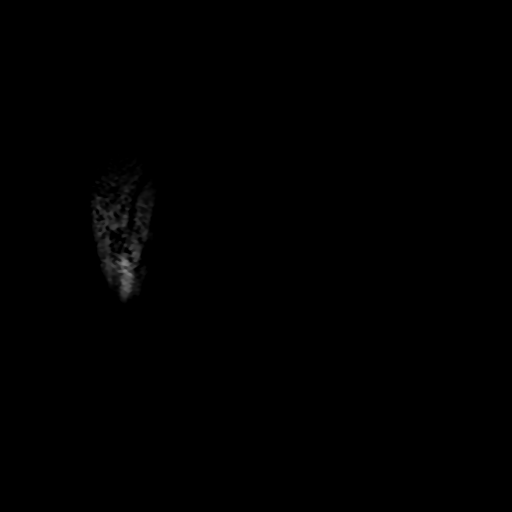
[im 13/19]
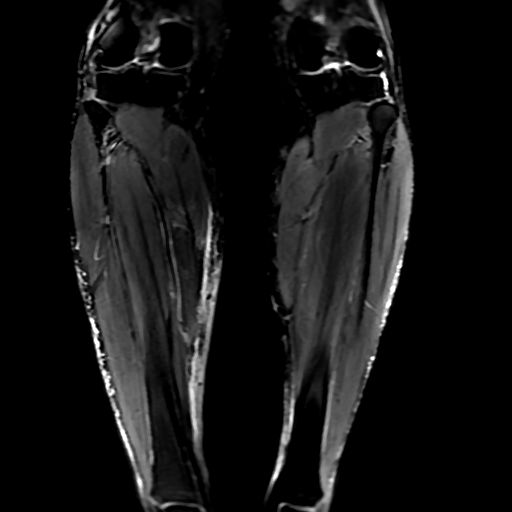
[im 19/19]
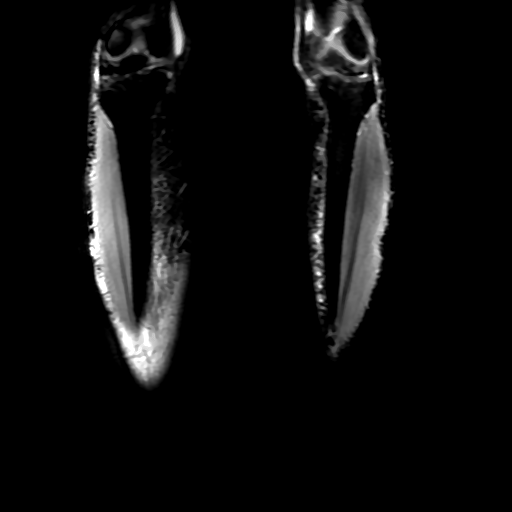

[Series 3: T1 · coronal · 4.0mm · 0.86mm/px · 4 of 21 slices shown (1 of 2)]
[im 1/21]
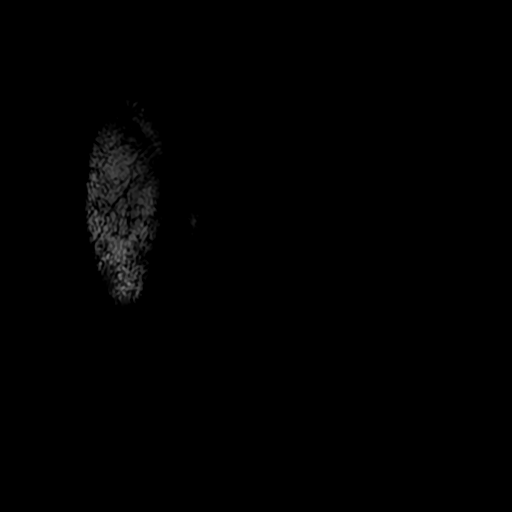
[im 7/21]
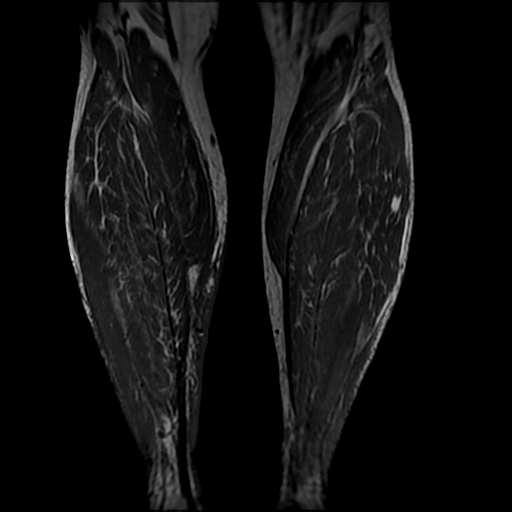
[im 14/21]
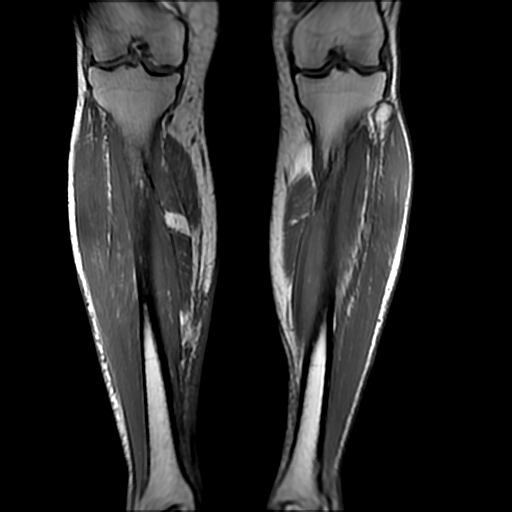
[im 21/21]
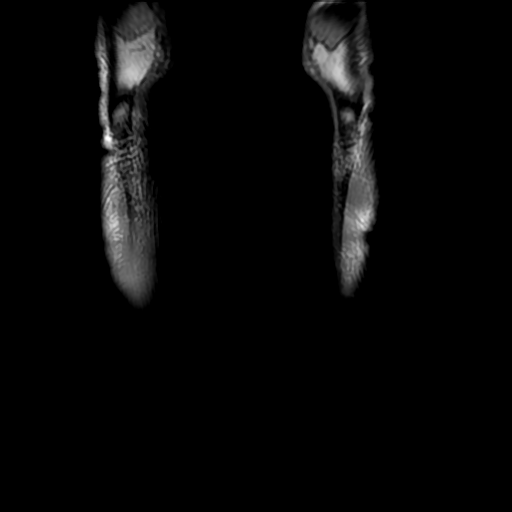

[Series 4: T1 · axial · 4.0mm · 0.51mm/px · z∈[-158,+67]mm · 3 of 69 slices shown (2 of 2)]
[im 12/69]
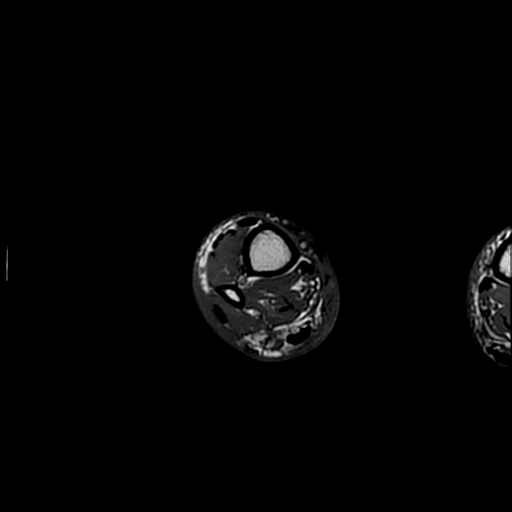
[im 35/69]
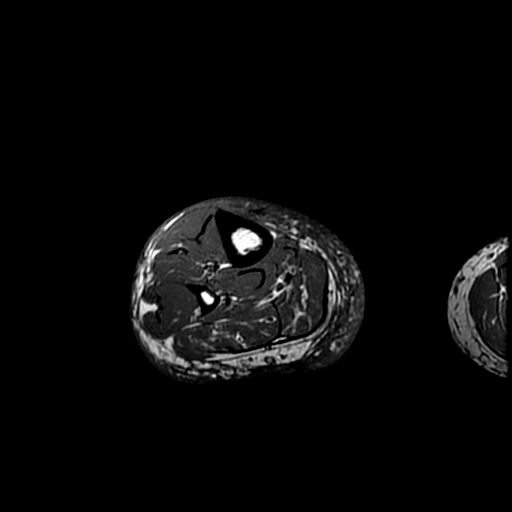
[im 57/69]
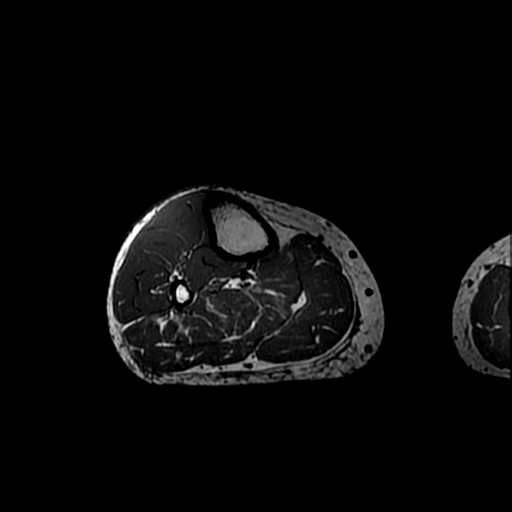

[Series 5: T2 fat-sat · axial · 4.0mm · 0.51mm/px · z∈[-213,+127]mm · 9 of 69 slices shown]
[im 1/69]
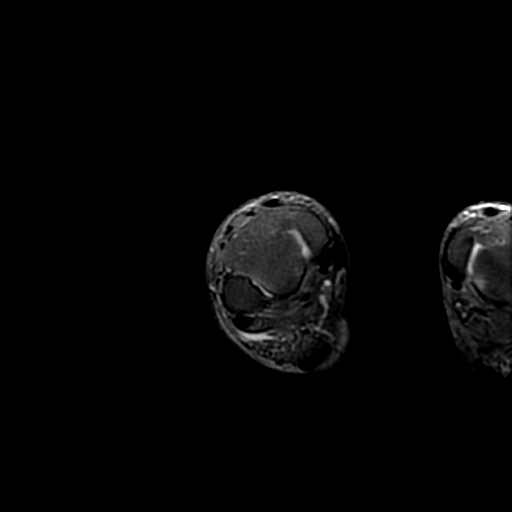
[im 12/69]
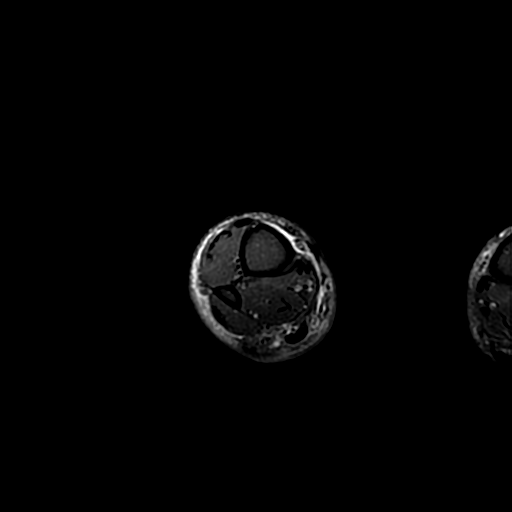
[im 23/69]
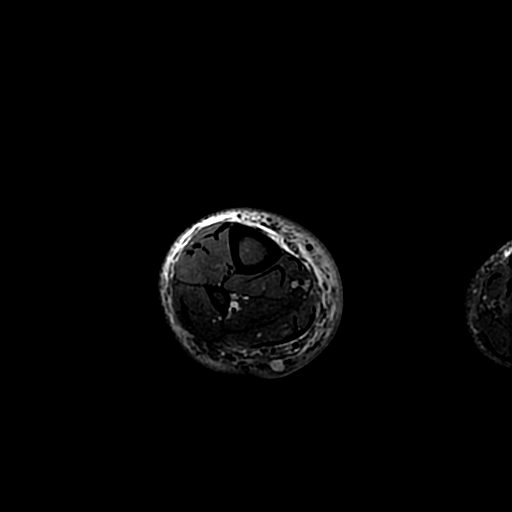
[im 29/69]
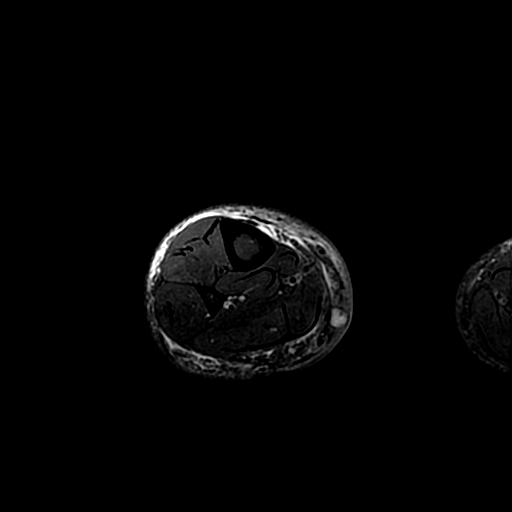
[im 35/69]
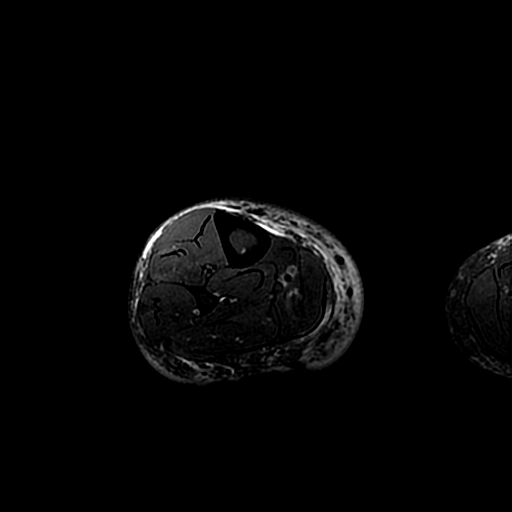
[im 40/69]
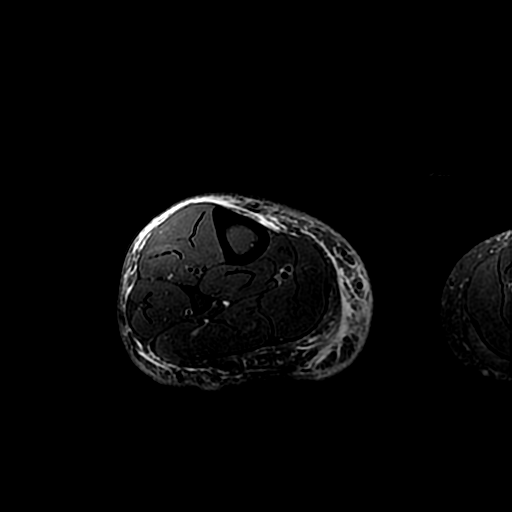
[im 46/69]
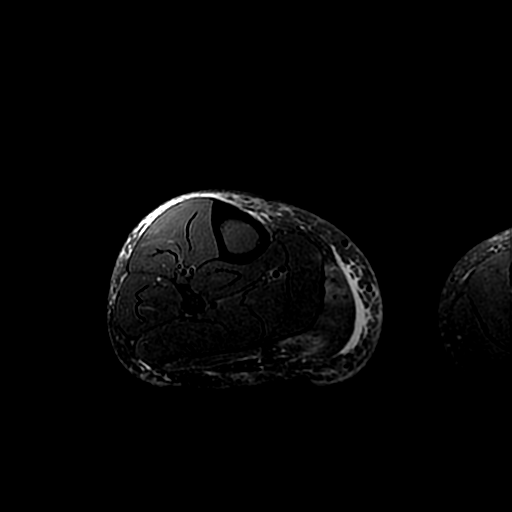
[im 57/69]
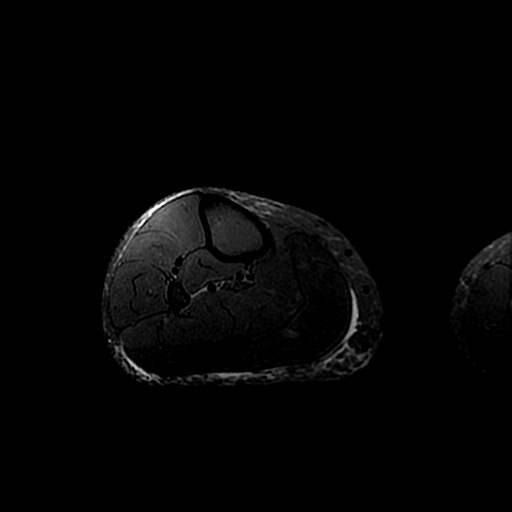
[im 69/69]
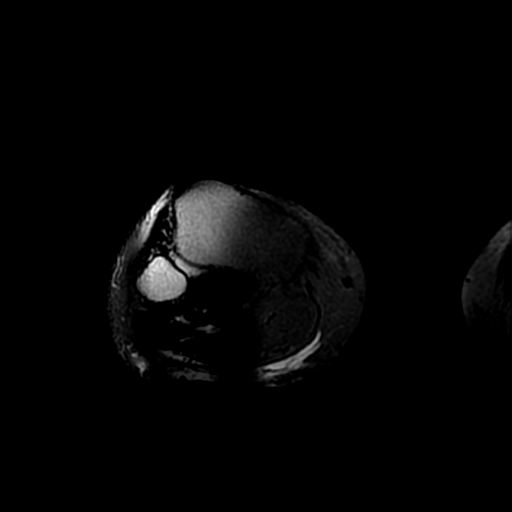

[19 of 40 positions shown; findings below may reference images not displayed]

FINDINGS: Bones/Joint/Cartilage

Bone marrow signal is normal throughout without fracture, stress
change or focal lesion. No evidence of osteomyelitis.

Ligaments

Negative.

Muscles and Tendons

Mildly increased T2 signal is seen in the distal aspect of the
medial gastrocnemius and underlying medial periphery of the soleus.
No intramuscular fluid collection. No tear. There is some fatty
atrophy of lower leg musculature bilaterally which appears
symmetric.

Soft tissues

Subcutaneous increased T2 signal in the lower legs bilaterally is
much worse on the right. A fluid collection in the posterior, medial
soft tissues of the right lower leg measures 0.8 cm transverse x
cm AP x 1.3 cm craniocaudal. A second small fluid collection more
inferiorly in the subcutaneous tissues measures 0.8 cm craniocaudal
by 0.6 cm transverse by 0.6 cm AP. A third tiny fluid collection in
the subcutaneous tissues measuring 0.9 cm is identified. Finally,
there is a fourth fluid collection in the subcutaneous tissues
lateral to the Achilles tendon which measures 0.6 cm in diameter.
IMPRESSION: Extensive subcutaneous edema about the right lower leg is compatible
with cellulitis. Mild edema in the inferior aspect of the medial
gastrocnemius and lateral periphery of the underlying soleus could
be due to myositis or related to denervation atrophy.

Four subcutaneous fluid collections measuring 1.3 cm or less in
diameter in the right lower leg as described above are worrisome for
abscesses.

## 2020-01-08 DIAGNOSIS — H5315 Visual distortions of shape and size: Secondary | ICD-10-CM | POA: Diagnosis not present

## 2020-01-08 DIAGNOSIS — H3342 Traction detachment of retina, left eye: Secondary | ICD-10-CM | POA: Diagnosis not present

## 2020-01-08 DIAGNOSIS — H35372 Puckering of macula, left eye: Secondary | ICD-10-CM | POA: Diagnosis not present

## 2020-01-08 DIAGNOSIS — H25813 Combined forms of age-related cataract, bilateral: Secondary | ICD-10-CM | POA: Diagnosis not present

## 2020-01-13 DIAGNOSIS — I10 Essential (primary) hypertension: Secondary | ICD-10-CM | POA: Diagnosis not present

## 2020-01-27 DIAGNOSIS — H35352 Cystoid macular degeneration, left eye: Secondary | ICD-10-CM | POA: Diagnosis not present

## 2020-01-27 DIAGNOSIS — H35372 Puckering of macula, left eye: Secondary | ICD-10-CM | POA: Diagnosis not present

## 2020-01-27 DIAGNOSIS — H5315 Visual distortions of shape and size: Secondary | ICD-10-CM | POA: Diagnosis not present

## 2020-01-27 DIAGNOSIS — H3342 Traction detachment of retina, left eye: Secondary | ICD-10-CM | POA: Diagnosis not present

## 2020-01-27 DIAGNOSIS — H25813 Combined forms of age-related cataract, bilateral: Secondary | ICD-10-CM | POA: Diagnosis not present

## 2020-02-04 DIAGNOSIS — H3342 Traction detachment of retina, left eye: Secondary | ICD-10-CM | POA: Diagnosis not present

## 2020-02-04 DIAGNOSIS — H35372 Puckering of macula, left eye: Secondary | ICD-10-CM | POA: Diagnosis not present

## 2020-02-13 DIAGNOSIS — H35372 Puckering of macula, left eye: Secondary | ICD-10-CM | POA: Diagnosis not present

## 2020-02-13 DIAGNOSIS — H35352 Cystoid macular degeneration, left eye: Secondary | ICD-10-CM | POA: Diagnosis not present

## 2020-02-24 DIAGNOSIS — I10 Essential (primary) hypertension: Secondary | ICD-10-CM | POA: Diagnosis not present

## 2020-02-24 DIAGNOSIS — Z125 Encounter for screening for malignant neoplasm of prostate: Secondary | ICD-10-CM | POA: Diagnosis not present

## 2020-02-24 DIAGNOSIS — E119 Type 2 diabetes mellitus without complications: Secondary | ICD-10-CM | POA: Diagnosis not present

## 2020-02-24 DIAGNOSIS — E118 Type 2 diabetes mellitus with unspecified complications: Secondary | ICD-10-CM | POA: Diagnosis not present

## 2020-02-26 DIAGNOSIS — N2581 Secondary hyperparathyroidism of renal origin: Secondary | ICD-10-CM | POA: Diagnosis not present

## 2020-02-26 DIAGNOSIS — K219 Gastro-esophageal reflux disease without esophagitis: Secondary | ICD-10-CM | POA: Diagnosis not present

## 2020-02-26 DIAGNOSIS — I10 Essential (primary) hypertension: Secondary | ICD-10-CM | POA: Diagnosis not present

## 2020-02-26 DIAGNOSIS — N1831 Chronic kidney disease, stage 3a: Secondary | ICD-10-CM | POA: Diagnosis not present

## 2020-02-26 DIAGNOSIS — Z0000 Encounter for general adult medical examination without abnormal findings: Secondary | ICD-10-CM | POA: Diagnosis not present

## 2020-02-26 DIAGNOSIS — E118 Type 2 diabetes mellitus with unspecified complications: Secondary | ICD-10-CM | POA: Diagnosis not present

## 2020-02-26 DIAGNOSIS — R0989 Other specified symptoms and signs involving the circulatory and respiratory systems: Secondary | ICD-10-CM | POA: Diagnosis not present

## 2020-02-26 DIAGNOSIS — E789 Disorder of lipoprotein metabolism, unspecified: Secondary | ICD-10-CM | POA: Diagnosis not present

## 2020-02-26 DIAGNOSIS — M1A0710 Idiopathic chronic gout, right ankle and foot, without tophus (tophi): Secondary | ICD-10-CM | POA: Diagnosis not present

## 2020-02-26 DIAGNOSIS — E1142 Type 2 diabetes mellitus with diabetic polyneuropathy: Secondary | ICD-10-CM | POA: Diagnosis not present

## 2020-02-27 DIAGNOSIS — R0989 Other specified symptoms and signs involving the circulatory and respiratory systems: Secondary | ICD-10-CM | POA: Diagnosis not present

## 2020-04-14 DIAGNOSIS — N183 Chronic kidney disease, stage 3 unspecified: Secondary | ICD-10-CM | POA: Diagnosis not present

## 2020-04-14 DIAGNOSIS — D631 Anemia in chronic kidney disease: Secondary | ICD-10-CM | POA: Diagnosis not present

## 2020-04-14 DIAGNOSIS — E119 Type 2 diabetes mellitus without complications: Secondary | ICD-10-CM | POA: Diagnosis not present

## 2020-04-14 DIAGNOSIS — E782 Mixed hyperlipidemia: Secondary | ICD-10-CM | POA: Diagnosis not present

## 2020-04-14 DIAGNOSIS — Z6830 Body mass index (BMI) 30.0-30.9, adult: Secondary | ICD-10-CM | POA: Diagnosis not present

## 2020-04-14 DIAGNOSIS — E1142 Type 2 diabetes mellitus with diabetic polyneuropathy: Secondary | ICD-10-CM | POA: Diagnosis not present

## 2020-04-14 DIAGNOSIS — K219 Gastro-esophageal reflux disease without esophagitis: Secondary | ICD-10-CM | POA: Diagnosis not present

## 2020-04-14 DIAGNOSIS — I10 Essential (primary) hypertension: Secondary | ICD-10-CM | POA: Diagnosis not present

## 2020-04-20 DIAGNOSIS — H25813 Combined forms of age-related cataract, bilateral: Secondary | ICD-10-CM | POA: Diagnosis not present

## 2020-04-20 DIAGNOSIS — H35372 Puckering of macula, left eye: Secondary | ICD-10-CM | POA: Diagnosis not present

## 2020-04-20 DIAGNOSIS — H35352 Cystoid macular degeneration, left eye: Secondary | ICD-10-CM | POA: Diagnosis not present

## 2020-04-20 DIAGNOSIS — H35033 Hypertensive retinopathy, bilateral: Secondary | ICD-10-CM | POA: Diagnosis not present

## 2020-05-26 DIAGNOSIS — Z23 Encounter for immunization: Secondary | ICD-10-CM | POA: Diagnosis not present

## 2020-06-10 DIAGNOSIS — N183 Chronic kidney disease, stage 3 unspecified: Secondary | ICD-10-CM | POA: Diagnosis not present

## 2020-06-10 DIAGNOSIS — E1122 Type 2 diabetes mellitus with diabetic chronic kidney disease: Secondary | ICD-10-CM | POA: Diagnosis not present

## 2020-06-10 DIAGNOSIS — N2581 Secondary hyperparathyroidism of renal origin: Secondary | ICD-10-CM | POA: Diagnosis not present

## 2020-06-10 DIAGNOSIS — N189 Chronic kidney disease, unspecified: Secondary | ICD-10-CM | POA: Diagnosis not present

## 2020-06-10 DIAGNOSIS — D631 Anemia in chronic kidney disease: Secondary | ICD-10-CM | POA: Diagnosis not present

## 2020-06-10 DIAGNOSIS — M109 Gout, unspecified: Secondary | ICD-10-CM | POA: Diagnosis not present

## 2020-06-10 DIAGNOSIS — I129 Hypertensive chronic kidney disease with stage 1 through stage 4 chronic kidney disease, or unspecified chronic kidney disease: Secondary | ICD-10-CM | POA: Diagnosis not present

## 2020-06-17 DIAGNOSIS — H35372 Puckering of macula, left eye: Secondary | ICD-10-CM | POA: Diagnosis not present

## 2020-06-17 DIAGNOSIS — H25812 Combined forms of age-related cataract, left eye: Secondary | ICD-10-CM | POA: Diagnosis not present

## 2020-06-17 DIAGNOSIS — H47323 Drusen of optic disc, bilateral: Secondary | ICD-10-CM | POA: Diagnosis not present

## 2020-06-17 DIAGNOSIS — H47321 Drusen of optic disc, right eye: Secondary | ICD-10-CM | POA: Diagnosis not present

## 2020-06-17 DIAGNOSIS — H47322 Drusen of optic disc, left eye: Secondary | ICD-10-CM | POA: Diagnosis not present

## 2020-06-17 DIAGNOSIS — H35033 Hypertensive retinopathy, bilateral: Secondary | ICD-10-CM | POA: Diagnosis not present

## 2020-06-17 DIAGNOSIS — H5315 Visual distortions of shape and size: Secondary | ICD-10-CM | POA: Diagnosis not present

## 2020-06-23 DIAGNOSIS — H2513 Age-related nuclear cataract, bilateral: Secondary | ICD-10-CM | POA: Diagnosis not present

## 2020-06-23 DIAGNOSIS — H25013 Cortical age-related cataract, bilateral: Secondary | ICD-10-CM | POA: Diagnosis not present

## 2020-06-23 DIAGNOSIS — H47323 Drusen of optic disc, bilateral: Secondary | ICD-10-CM | POA: Diagnosis not present

## 2020-06-23 DIAGNOSIS — H524 Presbyopia: Secondary | ICD-10-CM | POA: Diagnosis not present

## 2020-07-01 DIAGNOSIS — E119 Type 2 diabetes mellitus without complications: Secondary | ICD-10-CM | POA: Diagnosis not present

## 2020-07-01 DIAGNOSIS — E1142 Type 2 diabetes mellitus with diabetic polyneuropathy: Secondary | ICD-10-CM | POA: Diagnosis not present

## 2020-08-13 DIAGNOSIS — Z79899 Other long term (current) drug therapy: Secondary | ICD-10-CM | POA: Diagnosis not present

## 2020-08-13 DIAGNOSIS — E119 Type 2 diabetes mellitus without complications: Secondary | ICD-10-CM | POA: Diagnosis not present

## 2020-08-13 DIAGNOSIS — E118 Type 2 diabetes mellitus with unspecified complications: Secondary | ICD-10-CM | POA: Diagnosis not present

## 2020-08-13 DIAGNOSIS — E559 Vitamin D deficiency, unspecified: Secondary | ICD-10-CM | POA: Diagnosis not present

## 2020-08-27 DIAGNOSIS — H2512 Age-related nuclear cataract, left eye: Secondary | ICD-10-CM | POA: Diagnosis not present

## 2020-08-27 DIAGNOSIS — H25812 Combined forms of age-related cataract, left eye: Secondary | ICD-10-CM | POA: Diagnosis not present

## 2020-08-27 DIAGNOSIS — H25012 Cortical age-related cataract, left eye: Secondary | ICD-10-CM | POA: Diagnosis not present

## 2020-08-27 DIAGNOSIS — H52222 Regular astigmatism, left eye: Secondary | ICD-10-CM | POA: Diagnosis not present

## 2020-09-02 DIAGNOSIS — E119 Type 2 diabetes mellitus without complications: Secondary | ICD-10-CM | POA: Diagnosis not present

## 2020-09-02 DIAGNOSIS — E1142 Type 2 diabetes mellitus with diabetic polyneuropathy: Secondary | ICD-10-CM | POA: Diagnosis not present

## 2020-09-02 DIAGNOSIS — E782 Mixed hyperlipidemia: Secondary | ICD-10-CM | POA: Diagnosis not present

## 2020-09-02 DIAGNOSIS — E559 Vitamin D deficiency, unspecified: Secondary | ICD-10-CM | POA: Diagnosis not present

## 2020-09-02 DIAGNOSIS — Z6830 Body mass index (BMI) 30.0-30.9, adult: Secondary | ICD-10-CM | POA: Diagnosis not present

## 2020-09-02 DIAGNOSIS — N183 Chronic kidney disease, stage 3 unspecified: Secondary | ICD-10-CM | POA: Diagnosis not present

## 2020-09-02 DIAGNOSIS — D631 Anemia in chronic kidney disease: Secondary | ICD-10-CM | POA: Diagnosis not present

## 2020-09-02 DIAGNOSIS — I10 Essential (primary) hypertension: Secondary | ICD-10-CM | POA: Diagnosis not present

## 2020-09-25 DIAGNOSIS — H35372 Puckering of macula, left eye: Secondary | ICD-10-CM | POA: Diagnosis not present

## 2020-10-15 DIAGNOSIS — H2511 Age-related nuclear cataract, right eye: Secondary | ICD-10-CM | POA: Diagnosis not present

## 2020-10-15 DIAGNOSIS — H52221 Regular astigmatism, right eye: Secondary | ICD-10-CM | POA: Diagnosis not present

## 2020-10-15 DIAGNOSIS — H25011 Cortical age-related cataract, right eye: Secondary | ICD-10-CM | POA: Diagnosis not present

## 2020-10-15 DIAGNOSIS — H25811 Combined forms of age-related cataract, right eye: Secondary | ICD-10-CM | POA: Diagnosis not present

## 2020-11-24 DIAGNOSIS — Z961 Presence of intraocular lens: Secondary | ICD-10-CM | POA: Diagnosis not present

## 2020-11-25 DIAGNOSIS — E559 Vitamin D deficiency, unspecified: Secondary | ICD-10-CM | POA: Diagnosis not present

## 2020-11-25 DIAGNOSIS — E118 Type 2 diabetes mellitus with unspecified complications: Secondary | ICD-10-CM | POA: Diagnosis not present

## 2020-11-25 DIAGNOSIS — E119 Type 2 diabetes mellitus without complications: Secondary | ICD-10-CM | POA: Diagnosis not present

## 2020-12-02 DIAGNOSIS — D631 Anemia in chronic kidney disease: Secondary | ICD-10-CM | POA: Diagnosis not present

## 2020-12-02 DIAGNOSIS — E119 Type 2 diabetes mellitus without complications: Secondary | ICD-10-CM | POA: Diagnosis not present

## 2020-12-02 DIAGNOSIS — E1142 Type 2 diabetes mellitus with diabetic polyneuropathy: Secondary | ICD-10-CM | POA: Diagnosis not present

## 2020-12-02 DIAGNOSIS — E782 Mixed hyperlipidemia: Secondary | ICD-10-CM | POA: Diagnosis not present

## 2020-12-02 DIAGNOSIS — N183 Chronic kidney disease, stage 3 unspecified: Secondary | ICD-10-CM | POA: Diagnosis not present

## 2020-12-02 DIAGNOSIS — I10 Essential (primary) hypertension: Secondary | ICD-10-CM | POA: Diagnosis not present

## 2020-12-02 DIAGNOSIS — E559 Vitamin D deficiency, unspecified: Secondary | ICD-10-CM | POA: Diagnosis not present

## 2020-12-09 DIAGNOSIS — E118 Type 2 diabetes mellitus with unspecified complications: Secondary | ICD-10-CM | POA: Diagnosis not present

## 2020-12-30 DIAGNOSIS — H353131 Nonexudative age-related macular degeneration, bilateral, early dry stage: Secondary | ICD-10-CM | POA: Diagnosis not present

## 2020-12-30 DIAGNOSIS — H35352 Cystoid macular degeneration, left eye: Secondary | ICD-10-CM | POA: Diagnosis not present

## 2020-12-30 DIAGNOSIS — Z961 Presence of intraocular lens: Secondary | ICD-10-CM | POA: Diagnosis not present

## 2020-12-30 DIAGNOSIS — H59812 Chorioretinal scars after surgery for detachment, left eye: Secondary | ICD-10-CM | POA: Diagnosis not present

## 2020-12-30 DIAGNOSIS — H338 Other retinal detachments: Secondary | ICD-10-CM | POA: Diagnosis not present

## 2020-12-30 DIAGNOSIS — H35363 Drusen (degenerative) of macula, bilateral: Secondary | ICD-10-CM | POA: Diagnosis not present

## 2020-12-30 DIAGNOSIS — H47323 Drusen of optic disc, bilateral: Secondary | ICD-10-CM | POA: Diagnosis not present

## 2021-02-05 DIAGNOSIS — Z5181 Encounter for therapeutic drug level monitoring: Secondary | ICD-10-CM | POA: Diagnosis not present

## 2021-02-05 DIAGNOSIS — M47812 Spondylosis without myelopathy or radiculopathy, cervical region: Secondary | ICD-10-CM | POA: Diagnosis not present

## 2021-02-05 DIAGNOSIS — M542 Cervicalgia: Secondary | ICD-10-CM | POA: Diagnosis not present

## 2021-02-05 DIAGNOSIS — Z79899 Other long term (current) drug therapy: Secondary | ICD-10-CM | POA: Diagnosis not present

## 2021-03-02 DIAGNOSIS — E118 Type 2 diabetes mellitus with unspecified complications: Secondary | ICD-10-CM | POA: Diagnosis not present

## 2021-03-02 DIAGNOSIS — I10 Essential (primary) hypertension: Secondary | ICD-10-CM | POA: Diagnosis not present

## 2021-03-02 DIAGNOSIS — Z125 Encounter for screening for malignant neoplasm of prostate: Secondary | ICD-10-CM | POA: Diagnosis not present

## 2021-03-09 DIAGNOSIS — N1831 Chronic kidney disease, stage 3a: Secondary | ICD-10-CM | POA: Diagnosis not present

## 2021-03-09 DIAGNOSIS — R0989 Other specified symptoms and signs involving the circulatory and respiratory systems: Secondary | ICD-10-CM | POA: Diagnosis not present

## 2021-03-09 DIAGNOSIS — N529 Male erectile dysfunction, unspecified: Secondary | ICD-10-CM | POA: Diagnosis not present

## 2021-03-09 DIAGNOSIS — Z0000 Encounter for general adult medical examination without abnormal findings: Secondary | ICD-10-CM | POA: Diagnosis not present

## 2021-03-09 DIAGNOSIS — E118 Type 2 diabetes mellitus with unspecified complications: Secondary | ICD-10-CM | POA: Diagnosis not present

## 2021-03-09 DIAGNOSIS — K219 Gastro-esophageal reflux disease without esophagitis: Secondary | ICD-10-CM | POA: Diagnosis not present

## 2021-03-09 DIAGNOSIS — E1142 Type 2 diabetes mellitus with diabetic polyneuropathy: Secondary | ICD-10-CM | POA: Diagnosis not present

## 2021-03-09 DIAGNOSIS — I10 Essential (primary) hypertension: Secondary | ICD-10-CM | POA: Diagnosis not present

## 2021-03-09 DIAGNOSIS — N2581 Secondary hyperparathyroidism of renal origin: Secondary | ICD-10-CM | POA: Diagnosis not present

## 2021-03-10 ENCOUNTER — Other Ambulatory Visit: Payer: Self-pay | Admitting: Internal Medicine

## 2021-03-10 DIAGNOSIS — E118 Type 2 diabetes mellitus with unspecified complications: Secondary | ICD-10-CM

## 2021-03-11 DIAGNOSIS — D631 Anemia in chronic kidney disease: Secondary | ICD-10-CM | POA: Diagnosis not present

## 2021-03-11 DIAGNOSIS — E559 Vitamin D deficiency, unspecified: Secondary | ICD-10-CM | POA: Diagnosis not present

## 2021-03-11 DIAGNOSIS — E119 Type 2 diabetes mellitus without complications: Secondary | ICD-10-CM | POA: Diagnosis not present

## 2021-03-11 DIAGNOSIS — E782 Mixed hyperlipidemia: Secondary | ICD-10-CM | POA: Diagnosis not present

## 2021-03-11 DIAGNOSIS — I10 Essential (primary) hypertension: Secondary | ICD-10-CM | POA: Diagnosis not present

## 2021-03-11 DIAGNOSIS — N183 Chronic kidney disease, stage 3 unspecified: Secondary | ICD-10-CM | POA: Diagnosis not present

## 2021-03-11 DIAGNOSIS — E1142 Type 2 diabetes mellitus with diabetic polyneuropathy: Secondary | ICD-10-CM | POA: Diagnosis not present

## 2021-03-11 DIAGNOSIS — E118 Type 2 diabetes mellitus with unspecified complications: Secondary | ICD-10-CM | POA: Diagnosis not present

## 2021-03-25 DIAGNOSIS — M47812 Spondylosis without myelopathy or radiculopathy, cervical region: Secondary | ICD-10-CM | POA: Diagnosis not present

## 2021-03-30 ENCOUNTER — Ambulatory Visit
Admission: RE | Admit: 2021-03-30 | Discharge: 2021-03-30 | Disposition: A | Discharge status: Home / Self Care | Payer: No Typology Code available for payment source | Source: Ambulatory Visit | Attending: Internal Medicine | Admitting: Internal Medicine

## 2021-03-30 DIAGNOSIS — E118 Type 2 diabetes mellitus with unspecified complications: Secondary | ICD-10-CM

## 2021-04-06 DIAGNOSIS — I2510 Atherosclerotic heart disease of native coronary artery without angina pectoris: Secondary | ICD-10-CM | POA: Diagnosis not present

## 2021-04-09 ENCOUNTER — Telehealth: Payer: Self-pay | Admitting: Cardiovascular Disease

## 2021-04-09 NOTE — Telephone Encounter (Signed)
Returned call to patient who states that he was calling in to see if there was a diet that Dr. Allyson Sabal recommended for his elevated calcium score. Patient states he has a new patient appointment to see Dr. Allyson Sabal on 11/1 and states he knows that he eats poorly but wants to see what changes Dr. Allyson Sabal would prefer him to make during the next two months before his appointment. Advised patient of heart healthy diet options and offered to send information. Patient would like to see if Dr. Allyson Sabal has any specific recommendations. Advised patient I would forward message to Dr. Allyson Sabal for him to review and advise. Patient verbalized understanding.

## 2021-04-09 NOTE — Telephone Encounter (Signed)
MyChart message sent with heart healthy diet information.

## 2021-04-09 NOTE — Telephone Encounter (Signed)
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

## 2021-04-14 DIAGNOSIS — M47812 Spondylosis without myelopathy or radiculopathy, cervical region: Secondary | ICD-10-CM | POA: Diagnosis not present

## 2021-04-23 DIAGNOSIS — Z23 Encounter for immunization: Secondary | ICD-10-CM | POA: Diagnosis not present

## 2021-05-03 DIAGNOSIS — H338 Other retinal detachments: Secondary | ICD-10-CM | POA: Diagnosis not present

## 2021-05-03 DIAGNOSIS — H35363 Drusen (degenerative) of macula, bilateral: Secondary | ICD-10-CM | POA: Diagnosis not present

## 2021-05-03 DIAGNOSIS — Z961 Presence of intraocular lens: Secondary | ICD-10-CM | POA: Diagnosis not present

## 2021-05-03 DIAGNOSIS — H30021 Focal chorioretinal inflammation of posterior pole, right eye: Secondary | ICD-10-CM | POA: Diagnosis not present

## 2021-05-03 DIAGNOSIS — H47322 Drusen of optic disc, left eye: Secondary | ICD-10-CM | POA: Diagnosis not present

## 2021-05-03 DIAGNOSIS — H35372 Puckering of macula, left eye: Secondary | ICD-10-CM | POA: Diagnosis not present

## 2021-05-18 DIAGNOSIS — E119 Type 2 diabetes mellitus without complications: Secondary | ICD-10-CM | POA: Diagnosis not present

## 2021-05-18 DIAGNOSIS — Z961 Presence of intraocular lens: Secondary | ICD-10-CM | POA: Diagnosis not present

## 2021-05-28 DIAGNOSIS — M47812 Spondylosis without myelopathy or radiculopathy, cervical region: Secondary | ICD-10-CM | POA: Diagnosis not present

## 2021-06-08 ENCOUNTER — Encounter: Payer: Self-pay | Admitting: Cardiovascular Disease

## 2021-06-08 ENCOUNTER — Ambulatory Visit: Payer: Medicare PPO | Admitting: Cardiovascular Disease

## 2021-06-08 ENCOUNTER — Other Ambulatory Visit: Payer: Self-pay

## 2021-06-08 VITALS — BP 142/78 | HR 81 | Ht 69.0 in | Wt 181.4 lb

## 2021-06-08 DIAGNOSIS — R931 Abnormal findings on diagnostic imaging of heart and coronary circulation: Secondary | ICD-10-CM

## 2021-06-08 DIAGNOSIS — E785 Hyperlipidemia, unspecified: Secondary | ICD-10-CM | POA: Diagnosis not present

## 2021-06-08 DIAGNOSIS — I10 Essential (primary) hypertension: Secondary | ICD-10-CM

## 2021-06-08 MED ORDER — AMLODIPINE BESYLATE 5 MG PO TABS: 5.0000 mg | ORAL_TABLET | Freq: Every day | ORAL | 3 refills | Status: DC

## 2021-06-08 MED ORDER — ATORVASTATIN CALCIUM 80 MG PO TABS: 80.0000 mg | ORAL_TABLET | Freq: Every day | ORAL | 3 refills | Status: DC

## 2021-06-08 NOTE — Assessment & Plan Note (Signed)
Recent coronary calcium score ordered by Dr. [name] 03/30/2021 was 2643.  The calcium was distributed in all 3 coronary arteries, mostly in the LAD and RCA.  He is completely asymptomatic and very active.  Based on this I am going to increase his atorvastatin with a goal of LDL less than 70 and ordered an exercise Myoview stress test to look for physiologic significance.

## 2021-06-08 NOTE — Assessment & Plan Note (Signed)
History of hyperlipidemia on atorvastatin 30 mg a day with recent lipid profile performed by his PCP 27/26/22 revealing a total cholesterol of 207, and LDL of 118 and HDL of 53.  Given his coronary calcium score in the 2000 range I am going to increase his atorvastatin to 80 mg a day with a goal of LDL less than 70.  We will we will recheck a lipid liver profile in 3 months.

## 2021-06-08 NOTE — Progress Notes (Signed)
06/08/2021 James Roberson   1945/11/22  [id number]  Primary Physician [name], MD Primary Cardiologist: [name] MD FACP, [name], [name], [place]  HPI:  James Roberson is a 75 y.o. mildly overweight married Caucasian male father of 2 living children (one child died of cancer), grandfather of 9 grandchildren who was referred by Dr. [name], his PCP for cardiovascular evaluation because of an elevated coronary calcium score recently performed 03/30/2021.  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  He never smoked.  He does have a history of hypertension, diabetes and hyperlipidemia.  His father did have a myocardial infarction.  He is never had a heart attack or stroke.  He denies chest pain or shortness of breath.  A coronary calcium score performed 03/30/2021 was 2643 with calcium distributed in all 3 coronary arteries.   Current Meds  Medication Sig   atorvastatin (LIPITOR) 40 MG tablet Take 40 mg by mouth at bedtime.    buPROPion (WELLBUTRIN XL) 150 MG 24 hr tablet Take 150 mg by mouth daily.    gabapentin (NEURONTIN) 300 MG capsule Take 600 mg by mouth at bedtime.   lisinopril (ZESTRIL) 40 MG tablet Take 40 mg by mouth daily.   metFORMIN (GLUCOPHAGE-XR) 500 MG 24 hr tablet Take 1,000 mg by mouth 2 (two) times daily.   TRULICITY 1.5 MG/0.5ML SOPN Inject 0.5 mLs into the skin every Friday.   zaleplon (SONATA) 5 MG capsule Take 5-10 mg by mouth at bedtime as needed.   [DISCONTINUED] amLODipine-atorvastatin (CADUET) 5-10 MG tablet Take 1 tablet by mouth daily.     Allergies  Allergen Reactions   Avelox Abc [Moxifloxacin] Anaphylaxis   Tape Rash    PAPER TAPE ONLY!!   Ambien Cr [Zolpidem Tartrate Er] Other (See Comments)     Hallucinations   Zolpidem Other (See Comments)     Hallucinations - name brand only - tolerates generic    Other Rash    Nuderm and Coban     Social History   Socioeconomic History    Marital status: Married    Spouse name: Not on file   Number of children: Not on file   Years of education: Not on file   Highest education level: Not on file  Occupational History   Not on file  Tobacco Use   Smoking status: Former    Types: Cigarettes    Quit date: 07/23/1966    Years since quitting: 54.9   Smokeless tobacco: Never  Substance and Sexual Activity   Alcohol use: Yes    Comment: 1 a quarter   Drug use: Not on file   Sexual activity: Not on file  Other Topics Concern   Not on file  Social History Narrative   Not on file   Social Determinants of Health   Financial Resource Strain: Not on file  Food Insecurity: Not on file  Transportation Needs: Not on file  Physical Activity: Not on file  Stress: Not on file  Social Connections: Not on file  Intimate Partner Violence: Not on file     Review of Systems: General: negative for chills, fever, night sweats or weight changes.  Cardiovascular: negative for chest pain, dyspnea on exertion, edema, orthopnea, palpitations, paroxysmal nocturnal dyspnea or shortness of breath Dermatological: negative for rash Respiratory: negative for cough or wheezing Urologic: negative for hematuria Abdominal: negative for nausea, vomiting, diarrhea, bright red blood per rectum,  melena, or hematemesis Neurologic: negative for visual changes, syncope, or dizziness All other systems reviewed and are otherwise negative except as noted above.    Blood pressure (!) 160/76, pulse 81, height 5\' 9"  (1.753 m), weight 181 lb 6.4 oz (82.3 kg), SpO2 98 %.  General appearance: alert and no distress Neck: no adenopathy, no carotid bruit, no JVD, supple, symmetrical, trachea midline, and thyroid not enlarged, symmetric, no tenderness/mass/nodules Lungs: clear to auscultation bilaterally Heart: regular rate and rhythm, S1, S2 normal, no murmur, click, rub or gallop Extremities: extremities normal, atraumatic, no cyanosis or edema Pulses: 2+  and symmetric Skin: Skin color, texture, turgor normal. No rashes or lesions Neurologic: Grossly normal  EKG sinus rhythm at 81 without ST or T wave changes.  Personally reviewed this EKG.  ASSESSMENT AND PLAN:   HYPERLIPIDEMIA History of hyperlipidemia on atorvastatin 30 mg a day with recent lipid profile performed by his PCP 27/26/22 revealing a total cholesterol of 207, and LDL of 118 and HDL of 53.  Given his coronary calcium score in the 2000 range I am going to increase his atorvastatin to 80 mg a day with a goal of LDL less than 70.  We will we will recheck a lipid liver profile in 3 months.  Essential hypertension Check his blood history of essential hypertension a blood pressure measured today 160/76.  He does admit to "whitecoat hypertension.  He is on amlodipine and lisinopril.  Elevated coronary artery calcium score Recent coronary calcium score ordered by Dr. Shelia Media 03/30/2021 was 2643.  The calcium was distributed in all 3 coronary arteries, mostly in the LAD and RCA.  He is completely asymptomatic and very active.  Based on this I am going to increase his atorvastatin with a goal of LDL less than 70 and ordered an exercise Myoview stress test to look for physiologic significance.     Lorretta Harp MD FACP,FACC,FAHA, [facility] 06/08/2021 4:10 PM

## 2021-06-08 NOTE — Assessment & Plan Note (Signed)
Check his blood history of essential hypertension a blood pressure measured today 160/76.  He does admit to "whitecoat hypertension.  He is on amlodipine and lisinopril.

## 2021-06-08 NOTE — Patient Instructions (Signed)
Medication Instructions:   -Increase atorvastatin (Lipitor) to 80mg  once daily.  -Amlodipine (norvasc) 5mg  once daily.   *If you need a refill on your cardiac medications before your next appointment, please call your pharmacy*   Lab Work: Your physician recommends that you return for lab work in: 3 months for FASTING lipid/liver profile.  If you have labs (blood work) drawn today and your tests are completely normal, you will receive your results only by: MyChart Message (if you have MyChart) OR A paper copy in the mail If you have any lab test that is abnormal or we need to change your treatment, we will call you to review the results.   Testing/Procedures: Dr. has ordered a Myocardial Perfusion Imaging Study.   The test will take approximately 3 to 4 hours to complete; you may bring reading material.  If someone comes with you to your appointment, they will need to remain in the main lobby due to limited space in the testing area.   You will need to hold the following medications prior to your stress test: beta-blockers (24 hours prior to test)   How to prepare for your Myocardial Perfusion Test: Do not eat or drink 3 hours prior to your test, except you may have water. Do not consume products containing caffeine (regular or decaffeinated) 12 hours prior to your test. (ex: coffee, chocolate, sodas, tea). Do wear comfortable clothes (no dresses or overalls) and walking shoes, tennis shoes preferred (No heels or open toe shoes are allowed). Do NOT wear cologne, perfume, aftershave, or lotions (deodorant is allowed). If these instructions are not followed, your test will have to be rescheduled.    Follow-Up: At [facility], you and your health needs are our priority.  As part of our continuing mission to provide you with exceptional heart care, we have created designated Provider Care Teams.  These Care Teams include your primary Cardiologist (physician) and Advanced  Practice Providers (APPs -  Physician Assistants and Nurse Practitioners) who all work together to provide you with the care you need, when you need it.  We recommend signing up for the patient portal called "MyChart".  Sign up information is provided on this After Visit Summary.  MyChart is used to connect with patients for Virtual Visits (Telemedicine).  Patients are able to view lab/test results, encounter notes, upcoming appointments, etc.  Non-urgent messages can be sent to your provider as well.   To learn more about what you can do with MyChart, go to Allyson Sabal.    Your next appointment:   12 month(s)  The format for your next appointment:   In Person  Provider:   [facility], MD

## 2021-06-11 ENCOUNTER — Telehealth (HOSPITAL_COMMUNITY): Payer: Self-pay | Admitting: *Deleted

## 2021-06-11 NOTE — Telephone Encounter (Signed)
Close encounter 

## 2021-06-15 ENCOUNTER — Encounter (HOSPITAL_COMMUNITY): Payer: Medicare PPO

## 2021-06-15 ENCOUNTER — Ambulatory Visit (HOSPITAL_COMMUNITY)
Admission: RE | Admit: 2021-06-15 | Payer: Medicare PPO | Source: Ambulatory Visit | Attending: Cardiovascular Disease | Admitting: Cardiovascular Disease

## 2021-06-25 DIAGNOSIS — M542 Cervicalgia: Secondary | ICD-10-CM | POA: Diagnosis not present

## 2021-06-25 DIAGNOSIS — M47812 Spondylosis without myelopathy or radiculopathy, cervical region: Secondary | ICD-10-CM | POA: Diagnosis not present

## 2021-07-06 ENCOUNTER — Telehealth (HOSPITAL_COMMUNITY): Payer: Self-pay | Admitting: *Deleted

## 2021-07-06 NOTE — Telephone Encounter (Signed)
Close encounter 

## 2021-07-07 ENCOUNTER — Telehealth (HOSPITAL_COMMUNITY): Payer: Self-pay | Admitting: *Deleted

## 2021-07-07 NOTE — Telephone Encounter (Signed)
Close encounter 

## 2021-07-08 ENCOUNTER — Other Ambulatory Visit: Payer: Self-pay

## 2021-07-08 ENCOUNTER — Ambulatory Visit (HOSPITAL_COMMUNITY)
Admission: RE | Admit: 2021-07-08 | Discharge: 2021-07-08 | Disposition: A | Discharge status: Home / Self Care | Payer: Medicare PPO | Source: Ambulatory Visit | Attending: Cardiology | Admitting: Cardiology

## 2021-07-08 DIAGNOSIS — E1142 Type 2 diabetes mellitus with diabetic polyneuropathy: Secondary | ICD-10-CM | POA: Diagnosis not present

## 2021-07-08 DIAGNOSIS — Z87891 Personal history of nicotine dependence: Secondary | ICD-10-CM | POA: Diagnosis not present

## 2021-07-08 DIAGNOSIS — I10 Essential (primary) hypertension: Secondary | ICD-10-CM | POA: Diagnosis not present

## 2021-07-08 DIAGNOSIS — Z8249 Family history of ischemic heart disease and other diseases of the circulatory system: Secondary | ICD-10-CM | POA: Diagnosis not present

## 2021-07-08 DIAGNOSIS — E785 Hyperlipidemia, unspecified: Secondary | ICD-10-CM | POA: Diagnosis not present

## 2021-07-08 DIAGNOSIS — R931 Abnormal findings on diagnostic imaging of heart and coronary circulation: Secondary | ICD-10-CM | POA: Diagnosis not present

## 2021-07-08 DIAGNOSIS — I129 Hypertensive chronic kidney disease with stage 1 through stage 4 chronic kidney disease, or unspecified chronic kidney disease: Secondary | ICD-10-CM | POA: Insufficient documentation

## 2021-07-08 DIAGNOSIS — N183 Chronic kidney disease, stage 3 unspecified: Secondary | ICD-10-CM | POA: Diagnosis not present

## 2021-07-08 DIAGNOSIS — E1122 Type 2 diabetes mellitus with diabetic chronic kidney disease: Secondary | ICD-10-CM | POA: Insufficient documentation

## 2021-07-08 LAB — MYOCARDIAL PERFUSION IMAGING
LV dias vol: 73 mL (ref 62–150)
LV sys vol: 23 mL
Nuc Stress EF: 69 %
Peak HR: 103 {beats}/min
Rest HR: 79 {beats}/min
Rest Nuclear Isotope Dose: 10.9 mCi
SDS: 5
SRS: 0
SSS: 5
ST Depression (mm): 0 mm
Stress Nuclear Isotope Dose: 31.2 mCi
TID: 0.97

## 2021-07-08 MED ORDER — TECHNETIUM TC 99M TETROFOSMIN IV KIT
31.2000 | PACK | Freq: Once | INTRAVENOUS | Status: AC | PRN
  Administered 2021-07-08: 31.2 via INTRAVENOUS
  Filled 2021-07-08: qty 32

## 2021-07-08 MED ORDER — TECHNETIUM TC 99M TETROFOSMIN IV KIT
10.9000 | PACK | Freq: Once | INTRAVENOUS | Status: AC | PRN
  Administered 2021-07-08: 10.9 via INTRAVENOUS
  Filled 2021-07-08: qty 11

## 2021-07-08 MED ORDER — REGADENOSON 0.4 MG/5ML IV SOLN
0.4000 mg | Freq: Once | INTRAVENOUS | Status: AC
  Administered 2021-07-08: 0.4 mg via INTRAVENOUS

## 2021-07-20 DIAGNOSIS — D631 Anemia in chronic kidney disease: Secondary | ICD-10-CM | POA: Diagnosis not present

## 2021-07-20 DIAGNOSIS — E119 Type 2 diabetes mellitus without complications: Secondary | ICD-10-CM | POA: Diagnosis not present

## 2021-07-20 DIAGNOSIS — E782 Mixed hyperlipidemia: Secondary | ICD-10-CM | POA: Diagnosis not present

## 2021-07-20 DIAGNOSIS — N1831 Chronic kidney disease, stage 3a: Secondary | ICD-10-CM | POA: Diagnosis not present

## 2021-07-20 DIAGNOSIS — I10 Essential (primary) hypertension: Secondary | ICD-10-CM | POA: Diagnosis not present

## 2021-07-20 DIAGNOSIS — E1142 Type 2 diabetes mellitus with diabetic polyneuropathy: Secondary | ICD-10-CM | POA: Diagnosis not present

## 2021-07-20 DIAGNOSIS — E559 Vitamin D deficiency, unspecified: Secondary | ICD-10-CM | POA: Diagnosis not present

## 2021-07-20 DIAGNOSIS — I2510 Atherosclerotic heart disease of native coronary artery without angina pectoris: Secondary | ICD-10-CM | POA: Diagnosis not present

## 2021-08-06 DIAGNOSIS — E119 Type 2 diabetes mellitus without complications: Secondary | ICD-10-CM | POA: Diagnosis not present

## 2021-08-27 ENCOUNTER — Other Ambulatory Visit: Payer: Self-pay

## 2021-10-26 ENCOUNTER — Other Ambulatory Visit: Payer: Self-pay

## 2021-10-26 ENCOUNTER — Encounter (HOSPITAL_BASED_OUTPATIENT_CLINIC_OR_DEPARTMENT_OTHER): Payer: Self-pay | Admitting: Emergency Medicine

## 2021-10-26 ENCOUNTER — Emergency Department (HOSPITAL_BASED_OUTPATIENT_CLINIC_OR_DEPARTMENT_OTHER): Payer: Medicare PPO | Admitting: Radiology

## 2021-10-26 ENCOUNTER — Emergency Department (HOSPITAL_BASED_OUTPATIENT_CLINIC_OR_DEPARTMENT_OTHER)
Admission: EM | Admit: 2021-10-26 | Discharge: 2021-10-26 | Disposition: A | Discharge status: Home / Self Care | Payer: Medicare PPO | Attending: Emergency Medicine | Admitting: Emergency Medicine

## 2021-10-26 DIAGNOSIS — E119 Type 2 diabetes mellitus without complications: Secondary | ICD-10-CM | POA: Diagnosis not present

## 2021-10-26 DIAGNOSIS — Z7984 Long term (current) use of oral hypoglycemic drugs: Secondary | ICD-10-CM | POA: Diagnosis not present

## 2021-10-26 DIAGNOSIS — N189 Chronic kidney disease, unspecified: Secondary | ICD-10-CM | POA: Diagnosis not present

## 2021-10-26 DIAGNOSIS — R072 Precordial pain: Secondary | ICD-10-CM | POA: Diagnosis not present

## 2021-10-26 DIAGNOSIS — I129 Hypertensive chronic kidney disease with stage 1 through stage 4 chronic kidney disease, or unspecified chronic kidney disease: Secondary | ICD-10-CM | POA: Insufficient documentation

## 2021-10-26 DIAGNOSIS — R1013 Epigastric pain: Secondary | ICD-10-CM | POA: Diagnosis not present

## 2021-10-26 DIAGNOSIS — Z79899 Other long term (current) drug therapy: Secondary | ICD-10-CM | POA: Insufficient documentation

## 2021-10-26 DIAGNOSIS — R0602 Shortness of breath: Secondary | ICD-10-CM | POA: Insufficient documentation

## 2021-10-26 DIAGNOSIS — D72829 Elevated white blood cell count, unspecified: Secondary | ICD-10-CM | POA: Diagnosis not present

## 2021-10-26 DIAGNOSIS — R0789 Other chest pain: Secondary | ICD-10-CM | POA: Diagnosis not present

## 2021-10-26 DIAGNOSIS — R079 Chest pain, unspecified: Secondary | ICD-10-CM | POA: Diagnosis not present

## 2021-10-26 LAB — CBC
HCT: 43.2 % (ref 39.0–52.0)
Hemoglobin: 13.9 g/dL (ref 13.0–17.0)
MCH: 28.3 pg (ref 26.0–34.0)
MCHC: 32.2 g/dL (ref 30.0–36.0)
MCV: 87.8 fL (ref 80.0–100.0)
Platelets: 362 10*3/uL (ref 150–400)
RBC: 4.92 MIL/uL (ref 4.22–5.81)
RDW: 12.1 % (ref 11.5–15.5)
WBC: 10.8 10*3/uL — ABNORMAL HIGH (ref 4.0–10.5)
nRBC: 0 % (ref 0.0–0.2)

## 2021-10-26 LAB — BASIC METABOLIC PANEL
Anion gap: 18 — ABNORMAL HIGH (ref 5–15)
BUN: 34 mg/dL — ABNORMAL HIGH (ref 8–23)
CO2: 17 mmol/L — ABNORMAL LOW (ref 22–32)
Calcium: 10.3 mg/dL (ref 8.9–10.3)
Chloride: 101 mmol/L (ref 98–111)
Creatinine, Ser: 1.61 mg/dL — ABNORMAL HIGH (ref 0.61–1.24)
GFR, Estimated: 44 mL/min — ABNORMAL LOW (ref >60)
Glucose, Bld: 131 mg/dL — ABNORMAL HIGH (ref 70–99)
Potassium: 4.7 mmol/L (ref 3.5–5.1)
Sodium: 136 mmol/L (ref 135–145)

## 2021-10-26 LAB — TROPONIN I (HIGH SENSITIVITY)
Troponin I (High Sensitivity): 3 ng/L (ref <18)
Troponin I (High Sensitivity): 3 ng/L (ref <18)

## 2021-10-26 LAB — D-DIMER, QUANTITATIVE: D-Dimer, Quant: 0.37 ug/mL-FEU (ref 0.00–0.50)

## 2021-10-26 MED ORDER — ALUM & MAG HYDROXIDE-SIMETH 200-200-20 MG/5ML PO SUSP
30.0000 mL | Freq: Once | ORAL | Status: AC
  Administered 2021-10-26: 30 mL via ORAL
  Filled 2021-10-26: qty 30

## 2021-10-26 MED ORDER — LIDOCAINE VISCOUS HCL 2 % MT SOLN
15.0000 mL | Freq: Once | OROMUCOSAL | Status: AC
  Administered 2021-10-26: 15 mL via ORAL
  Filled 2021-10-26: qty 15

## 2021-10-26 MED ORDER — LACTATED RINGERS IV BOLUS
500.0000 mL | Freq: Once | INTRAVENOUS | Status: AC
  Administered 2021-10-26: 500 mL via INTRAVENOUS

## 2021-10-26 MED ORDER — FAMOTIDINE IN NACL 20-0.9 MG/50ML-% IV SOLN
20.0000 mg | Freq: Once | INTRAVENOUS | Status: AC
  Administered 2021-10-26: 20 mg via INTRAVENOUS
  Filled 2021-10-26: qty 50

## 2021-10-26 NOTE — ED Triage Notes (Signed)
Pt from home and c/o of chest intermittent pain in the mid sternal area. Pt also complains of hiatal hernia. Pt states currently that it is more pressure than pain. Pt also c/o of SOB with exertion. ?

## 2021-10-26 NOTE — ED Provider Notes (Signed)
Blood pressure 130/68, pulse 83, temperature 98.3 ?F (36.8 ?C), resp. rate 20, height 5\' 9"  (1.753 m), weight 81.6 kg, SpO2 100 %. ? ?Assuming care from Dr. .  In short, James Roberson is a 76 y.o. male with a chief complaint of Chest Pain ?73  Refer to the original H&P for additional details. ? ?The current plan of care is to follow up on troponin and d dimer. ? ?04:09 PM  ?Second troponin and D-dimer within normal limits.  Plan for follow-up with PCP to consider GI referral given his hiatal hernia.  Feeling improved after IV fluids.  Patient can coordinate with cardiology as well.  He is to call for appointment as above. No CP currently.  ? ? EKG Interpretation ? ?Date/Time:  Tuesday October 26 2021 13:40:53 EDT ?Ventricular Rate:  85 ?PR Interval:  159 ?QRS Duration: 88 ?QT Interval:  363 ?QTC Calculation: 432 ?R Axis:   48 ?Text Interpretation: Sinus rhythm Low voltage, precordial leads Confirmed by [id number] ([id number]) on 10/26/2021 2:27:50 PM ?  ? ?  ? ? ? ?  ?10/28/2021, MD ?10/26/21 1610 ? ?

## 2021-10-26 NOTE — Discharge Instructions (Signed)
You were seen in the emergency room today with chest discomfort.  Your work-up here is reassuring but I would like for you to follow close with your primary care doctor and discuss whether or not seeing GI specialist would be indicated.  Please also make the cardiology service aware of your ED evaluation.  If you develop any new or suddenly worsening symptoms please call 911 and/or return to the ED.  ?

## 2021-10-26 NOTE — ED Provider Notes (Addendum)
?[facility] EMERGENCY DEPT ?Provider Note ? ? ?CSN: [id number] ?Arrival date & time: 10/26/21  1244 ? ?  ? ?History ? ?Chief Complaint  ?Patient presents with  ? Chest Pain  ? ? ?James Roberson is a 76 y.o. male. ? ? ?Chest Pain ? ?76 year old male with a history of DM2, HLD, HTN, obesity, psoriasis presenting to the emergency department With a chief complaint of chest pain.  The patient states that he also has a history of hiatal hernia.  The patient states that he has had intermittent substernal chest pain.  He describes it as a pressure-like sensation.  He also endorses mild shortness of breath.  No cough.  No fever or chills.  ? ?He last had a myocardial perfusion exercise stress in December that was reassuring.  He follows outpatient with Dr. York Ram. ? ?Home Medications ?Prior to Admission medications   ?Medication Sig Start Date End Date Taking? Authorizing Provider  ?amLODipine (NORVASC) 5 MG tablet Take 5 mg by mouth daily.   Yes [provider]  ?atorvastatin (LIPITOR) 80 MG tablet Take 1 tablet (80 mg total) by mouth at bedtime. 06/08/21  Yes [name], MD  ?buPROPion (WELLBUTRIN XL) 150 MG 24 hr tablet Take 150 mg by mouth every morning.   Yes [provider]  ?gabapentin (NEURONTIN) 300 MG capsule Take 600 mg by mouth at bedtime.   Yes [provider]  ?lisinopril (ZESTRIL) 40 MG tablet Take 40 mg by mouth daily.   Yes [provider]  ?metFORMIN (GLUCOPHAGE-XR) 500 MG 24 hr tablet Take 1,000 mg by mouth 2 (two) times daily.   Yes [provider]  ?TRULICITY 1.5 MG/0.5ML SOPN Inject 0.5 mLs into the skin every Friday. 05/05/18  Yes [provider]  ?zaleplon (SONATA) 5 MG capsule Take 5-10 mg by mouth at bedtime as needed. 10/30/19  Yes [provider]  ?   ? ?Allergies    ?Avelox abc [moxifloxacin], Tape, Ambien cr [zolpidem tartrate er], Zolpidem, and Other   ? ?Review of Systems   ?Review of Systems   ?Cardiovascular:  Positive for chest pain.  ?All other systems reviewed and are negative. ? ?Physical Exam ?Updated Vital Signs ?BP 137/78   Pulse 83   Temp 98.3 ?F (36.8 ?C)   Resp 18   Ht 5\' 9"  (1.753 m)   Wt 81.6 kg   SpO2 100%   BMI 26.58 kg/m?  ?Physical Exam ?Vitals and nursing note reviewed.  ?Constitutional:   ?   General: He is not in acute distress. ?   Appearance: He is well-developed.  ?HENT:  ?   Head: Normocephalic and atraumatic.  ?Eyes:  ?   Conjunctiva/sclera: Conjunctivae normal.  ?   Pupils: Pupils are equal, round, and reactive to light.  ?Cardiovascular:  ?   Rate and Rhythm: Normal rate and regular rhythm.  ?   Heart sounds: No murmur heard. ?Pulmonary:  ?   Effort: Pulmonary effort is normal. No respiratory distress.  ?   Breath sounds: Normal breath sounds.  ?Abdominal:  ?   General: There is no distension.  ?   Palpations: Abdomen is soft.  ?   Tenderness: There is abdominal tenderness in the epigastric area. There is no guarding.  ?Musculoskeletal:     ?   General: No swelling, deformity or signs of injury.  ?   Cervical back: Neck supple.  ?Skin: ?   General: Skin is warm and dry.  ?   Capillary  Refill: Capillary refill takes less than 2 seconds.  ?   Findings: No lesion or rash.  ?Neurological:  ?   General: No focal deficit present.  ?   Mental Status: He is alert. Mental status is at baseline.  ?Psychiatric:     ?   Mood and Affect: Mood normal.  ? ? ?ED Results / Procedures / Treatments   ?Labs ?(all labs ordered are listed, but only abnormal results are displayed) ?Labs Reviewed  ?BASIC METABOLIC PANEL - Abnormal; Notable for the following components:  ?    Result Value  ? CO2 17 (*)   ? Glucose, Bld 131 (*)   ? BUN 34 (*)   ? Creatinine, Ser 1.61 (*)   ? GFR, Estimated 44 (*)   ? Anion gap 18 (*)   ? All other components within normal limits  ?CBC - Abnormal; Notable for the following components:  ? WBC 10.8 (*)   ? All other components within normal limits  ?D-DIMER,  QUANTITATIVE  ?TROPONIN I (HIGH SENSITIVITY)  ?TROPONIN I (HIGH SENSITIVITY)  ? ? ?EKG ?EKG Interpretation ? ?Date/Time:  Tuesday October 26 2021 13:40:53 EDT ?Ventricular Rate:  85 ?PR Interval:  159 ?QRS Duration: 88 ?QT Interval:  363 ?QTC Calculation: 432 ?R Axis:   48 ?Text Interpretation: Sinus rhythm Low voltage, precordial leads Confirmed by Ernie AvenaLawsing, Chester Sibert ([id number]) on 10/26/2021 2:27:50 PM ? ?Radiology ?DG Chest 2 View ? ?Result Date: 10/26/2021 ?CLINICAL DATA:  Chest pain EXAM: CHEST - 2 VIEW COMPARISON:  Radiograph 02/27/2020 FINDINGS: Unchanged cardiomediastinal silhouette. No focal airspace consolidation. No pleural effusion. No pneumothorax. No acute osseous abnormality. Thoracic spondylosis. IMPRESSION: No evidence of acute cardiopulmonary disease. Electronically Signed   By: Caprice RenshawJacob  Kahn M.D.   On: 10/26/2021 13:17   ? ?Procedures ?Procedures  ? ? ?Medications Ordered in ED ?Medications  ?famotidine (PEPCID) IVPB 20 mg premix (0 mg Intravenous Stopped 10/26/21 1543)  ?alum & mag hydroxide-simeth (MAALOX/MYLANTA) 200-200-20 MG/5ML suspension 30 mL (30 mLs Oral Given 10/26/21 1453)  ?  And  ?lidocaine (XYLOCAINE) 2 % viscous mouth solution 15 mL (15 mLs Oral Given 10/26/21 1453)  ?lactated ringers bolus 500 mL (0 mLs Intravenous Stopped 10/26/21 1543)  ? ? ?ED Course/ Medical Decision Making/ A&P ?  ?                        ?Medical Decision Making ?Amount and/or Complexity of Data Reviewed ?Labs: ordered. ?Radiology: ordered. ? ?Risk ?OTC drugs. ?Prescription drug management. ? ? ?76 year old male with a history of DM2, HLD, HTN, obesity, psoriasis presenting to the emergency department With a chief complaint of chest pain.  The patient states that he also has a history of hiatal hernia.  The patient states that he has had intermittent substernal chest pain.  He describes it as a pressure-like sensation.  He also endorses mild shortness of breath.  No cough.  No fever or chills.  ? ?He last had a myocardial  perfusion exercise stress in December that was reassuring.  He follows outpatient with Dr. York RamJonathan Barry. ? ?Vitals and telemetry on arrival: Afebrile, mildly tachycardic P101, RR 18, mildly hypertensive BP P 152/77, saturating 100% on room air.  Sinus tachycardia noted on cardiac telemetry. ? ?Pertinent exam findings include: Mild epigastric tenderness to palpation ? ?Differential diagnosis includes: Hiatal hernia/GERD, ACS, pneumonia, pneumothorax, pulmonary embolism,pericarditis/myocarditis, PUD, musculoskeletal. HEART score of 4, moderate risk.  ? ?  PERC positive. Well's score, low probability. D-dimer  ordered and pending.  Initial labs unremarkable with initial negative troponin, repeat pending.  Unlikely pneumonia, no cough, no leukocytosis, no fevers, CXR and exam without acute findings. Unlikely pneumothorax, no findings on  CXR. Unlikely pericarditis/myocarditis, does not fit clinical picture. Chest pain not exertional. Unlikely dissection, no pulse deficit, no tearing chest pain, no neurologic complaints. Due to patient's high risk will rule out ACS. ? ?EKG: ?Normal sinus rhythm with a rate of 85 and no evidence of acute ischemic changes, abnormal intervals, or dysrhythmia. ? ? ?Lab results include: Initial troponin normal, D-dimer collected and pending, repeat troponin pending, BMP with no electrolyte abnormality, mildly elevated anion gap acidosis, CBC with a nonspecifically elevated leukocytosis to 10.8.  Patient's creatinine appears to be at baseline for his CKD. ? ?Imaging results include: Chest x-ray reviewed by myself and radiology negative for acute cardiac or pulmonary disease ? ?Course of tx has consisted of: Patient administered a 500 cc IV fluid bolus given his anion gap acidosis, IV Pepcid, viscous Maalox and lidocaine. ? ?Patient's symptoms are most consistent with GERD related to his hiatal hernia.  Will rule out ACS with delta troponins and rule out PE with a D-dimer given the patient's  tachycardia and sharp chest pain with mild shortness of breath.  Lower suspicion for acute intra-abdominal emergency at this time.  Patient has mild epigastric tenderness to palpation.  Low concern for acute pancreatitis.  Plan at ti

## 2021-10-26 NOTE — ED Notes (Signed)
During discharge pt understood paperwork. ?

## 2022-01-12 DIAGNOSIS — E118 Type 2 diabetes mellitus with unspecified complications: Secondary | ICD-10-CM | POA: Diagnosis not present

## 2022-01-12 DIAGNOSIS — E782 Mixed hyperlipidemia: Secondary | ICD-10-CM | POA: Diagnosis not present

## 2022-01-12 DIAGNOSIS — E559 Vitamin D deficiency, unspecified: Secondary | ICD-10-CM | POA: Diagnosis not present

## 2022-01-18 DIAGNOSIS — N183 Chronic kidney disease, stage 3 unspecified: Secondary | ICD-10-CM | POA: Diagnosis not present

## 2022-01-18 DIAGNOSIS — E118 Type 2 diabetes mellitus with unspecified complications: Secondary | ICD-10-CM | POA: Diagnosis not present

## 2022-01-18 DIAGNOSIS — E782 Mixed hyperlipidemia: Secondary | ICD-10-CM | POA: Diagnosis not present

## 2022-01-18 DIAGNOSIS — N1831 Chronic kidney disease, stage 3a: Secondary | ICD-10-CM | POA: Diagnosis not present

## 2022-01-18 DIAGNOSIS — I10 Essential (primary) hypertension: Secondary | ICD-10-CM | POA: Diagnosis not present

## 2022-01-19 DIAGNOSIS — E118 Type 2 diabetes mellitus with unspecified complications: Secondary | ICD-10-CM | POA: Diagnosis not present

## 2022-01-19 DIAGNOSIS — E782 Mixed hyperlipidemia: Secondary | ICD-10-CM | POA: Diagnosis not present

## 2022-01-19 DIAGNOSIS — K219 Gastro-esophageal reflux disease without esophagitis: Secondary | ICD-10-CM | POA: Diagnosis not present

## 2022-01-19 DIAGNOSIS — N1831 Chronic kidney disease, stage 3a: Secondary | ICD-10-CM | POA: Diagnosis not present

## 2022-01-20 DIAGNOSIS — R439 Unspecified disturbances of smell and taste: Secondary | ICD-10-CM | POA: Diagnosis not present

## 2022-01-20 DIAGNOSIS — K219 Gastro-esophageal reflux disease without esophagitis: Secondary | ICD-10-CM | POA: Diagnosis not present

## 2022-01-26 DIAGNOSIS — R519 Headache, unspecified: Secondary | ICD-10-CM | POA: Diagnosis not present

## 2022-01-26 DIAGNOSIS — R251 Tremor, unspecified: Secondary | ICD-10-CM | POA: Diagnosis not present

## 2022-01-26 DIAGNOSIS — M542 Cervicalgia: Secondary | ICD-10-CM | POA: Diagnosis not present

## 2022-01-26 DIAGNOSIS — M549 Dorsalgia, unspecified: Secondary | ICD-10-CM | POA: Diagnosis not present

## 2022-01-26 DIAGNOSIS — Z6827 Body mass index (BMI) 27.0-27.9, adult: Secondary | ICD-10-CM | POA: Diagnosis not present

## 2022-01-26 DIAGNOSIS — R482 Apraxia: Secondary | ICD-10-CM | POA: Diagnosis not present

## 2022-02-15 ENCOUNTER — Other Ambulatory Visit: Payer: Self-pay | Admitting: Internal Medicine

## 2022-02-15 DIAGNOSIS — M542 Cervicalgia: Secondary | ICD-10-CM | POA: Diagnosis not present

## 2022-02-15 DIAGNOSIS — R32 Unspecified urinary incontinence: Secondary | ICD-10-CM

## 2022-02-15 DIAGNOSIS — R519 Headache, unspecified: Secondary | ICD-10-CM

## 2022-02-15 DIAGNOSIS — H532 Diplopia: Secondary | ICD-10-CM

## 2022-02-15 DIAGNOSIS — F339 Major depressive disorder, recurrent, unspecified: Secondary | ICD-10-CM | POA: Diagnosis not present

## 2022-02-24 DIAGNOSIS — E119 Type 2 diabetes mellitus without complications: Secondary | ICD-10-CM | POA: Diagnosis not present

## 2022-02-24 DIAGNOSIS — H52203 Unspecified astigmatism, bilateral: Secondary | ICD-10-CM | POA: Diagnosis not present

## 2022-02-26 ENCOUNTER — Ambulatory Visit
Admission: RE | Admit: 2022-02-26 | Discharge: 2022-02-26 | Disposition: A | Discharge status: Home / Self Care | Payer: Medicare PPO | Source: Ambulatory Visit | Attending: Internal Medicine | Admitting: Internal Medicine

## 2022-02-26 DIAGNOSIS — R519 Headache, unspecified: Secondary | ICD-10-CM | POA: Diagnosis not present

## 2022-02-26 DIAGNOSIS — R32 Unspecified urinary incontinence: Secondary | ICD-10-CM

## 2022-02-26 DIAGNOSIS — H532 Diplopia: Secondary | ICD-10-CM

## 2022-02-26 DIAGNOSIS — M542 Cervicalgia: Secondary | ICD-10-CM

## 2022-03-07 DIAGNOSIS — N183 Chronic kidney disease, stage 3 unspecified: Secondary | ICD-10-CM | POA: Diagnosis not present

## 2022-03-07 DIAGNOSIS — E1122 Type 2 diabetes mellitus with diabetic chronic kidney disease: Secondary | ICD-10-CM | POA: Diagnosis not present

## 2022-03-07 DIAGNOSIS — N2581 Secondary hyperparathyroidism of renal origin: Secondary | ICD-10-CM | POA: Diagnosis not present

## 2022-03-07 DIAGNOSIS — R29898 Other symptoms and signs involving the musculoskeletal system: Secondary | ICD-10-CM | POA: Diagnosis not present

## 2022-03-07 DIAGNOSIS — N189 Chronic kidney disease, unspecified: Secondary | ICD-10-CM | POA: Diagnosis not present

## 2022-03-07 DIAGNOSIS — I129 Hypertensive chronic kidney disease with stage 1 through stage 4 chronic kidney disease, or unspecified chronic kidney disease: Secondary | ICD-10-CM | POA: Diagnosis not present

## 2022-03-07 DIAGNOSIS — D519 Vitamin B12 deficiency anemia, unspecified: Secondary | ICD-10-CM | POA: Diagnosis not present

## 2022-03-09 DIAGNOSIS — Z6827 Body mass index (BMI) 27.0-27.9, adult: Secondary | ICD-10-CM | POA: Diagnosis not present

## 2022-03-09 DIAGNOSIS — M5090 Cervical disc disorder, unspecified, unspecified cervical region: Secondary | ICD-10-CM | POA: Diagnosis not present

## 2022-03-10 DIAGNOSIS — E118 Type 2 diabetes mellitus with unspecified complications: Secondary | ICD-10-CM | POA: Diagnosis not present

## 2022-03-10 DIAGNOSIS — I10 Essential (primary) hypertension: Secondary | ICD-10-CM | POA: Diagnosis not present

## 2022-03-10 DIAGNOSIS — Z125 Encounter for screening for malignant neoplasm of prostate: Secondary | ICD-10-CM | POA: Diagnosis not present

## 2022-03-14 DIAGNOSIS — N1831 Chronic kidney disease, stage 3a: Secondary | ICD-10-CM | POA: Diagnosis not present

## 2022-03-14 DIAGNOSIS — N2581 Secondary hyperparathyroidism of renal origin: Secondary | ICD-10-CM | POA: Diagnosis not present

## 2022-03-14 DIAGNOSIS — E118 Type 2 diabetes mellitus with unspecified complications: Secondary | ICD-10-CM | POA: Diagnosis not present

## 2022-03-14 DIAGNOSIS — M6281 Muscle weakness (generalized): Secondary | ICD-10-CM | POA: Diagnosis not present

## 2022-03-14 DIAGNOSIS — Z0000 Encounter for general adult medical examination without abnormal findings: Secondary | ICD-10-CM | POA: Diagnosis not present

## 2022-03-14 DIAGNOSIS — I700 Atherosclerosis of aorta: Secondary | ICD-10-CM | POA: Diagnosis not present

## 2022-03-14 DIAGNOSIS — I10 Essential (primary) hypertension: Secondary | ICD-10-CM | POA: Diagnosis not present

## 2022-03-14 DIAGNOSIS — M1A0710 Idiopathic chronic gout, right ankle and foot, without tophus (tophi): Secondary | ICD-10-CM | POA: Diagnosis not present

## 2022-03-14 DIAGNOSIS — E119 Type 2 diabetes mellitus without complications: Secondary | ICD-10-CM | POA: Diagnosis not present

## 2022-03-14 DIAGNOSIS — I2510 Atherosclerotic heart disease of native coronary artery without angina pectoris: Secondary | ICD-10-CM | POA: Diagnosis not present

## 2022-03-14 DIAGNOSIS — F339 Major depressive disorder, recurrent, unspecified: Secondary | ICD-10-CM | POA: Diagnosis not present

## 2022-03-29 DIAGNOSIS — M6281 Muscle weakness (generalized): Secondary | ICD-10-CM | POA: Diagnosis not present

## 2022-03-30 DIAGNOSIS — E785 Hyperlipidemia, unspecified: Secondary | ICD-10-CM | POA: Diagnosis not present

## 2022-03-30 DIAGNOSIS — R931 Abnormal findings on diagnostic imaging of heart and coronary circulation: Secondary | ICD-10-CM | POA: Diagnosis not present

## 2022-03-31 LAB — HEPATIC FUNCTION PANEL
ALT: 26 IU/L (ref 0–44)
AST: 18 IU/L (ref 0–40)
Albumin: 4.5 g/dL (ref 3.8–4.8)
Alkaline Phosphatase: 90 IU/L (ref 44–121)
Bilirubin Total: <0.2 mg/dL (ref 0.0–1.2)
Bilirubin, Direct: <0.1 mg/dL (ref 0.00–0.40)
Total Protein: 6.9 g/dL (ref 6.0–8.5)

## 2022-03-31 LAB — LIPID PANEL
Chol/HDL Ratio: 3.9 ratio (ref 0.0–5.0)
Cholesterol, Total: 259 mg/dL — ABNORMAL HIGH (ref 100–199)
HDL: 66 mg/dL (ref >39)
LDL Chol Calc (NIH): 158 mg/dL — ABNORMAL HIGH (ref 0–99)
Triglycerides: 193 mg/dL — ABNORMAL HIGH (ref 0–149)
VLDL Cholesterol Cal: 35 mg/dL (ref 5–40)

## 2022-04-06 ENCOUNTER — Encounter: Payer: Self-pay | Admitting: *Deleted

## 2022-04-06 ENCOUNTER — Ambulatory Visit: Payer: Medicare PPO | Admitting: Diagnostic Neuroimaging

## 2022-04-06 VITALS — BP 151/78 | HR 81 | Ht 69.0 in | Wt 185.0 lb

## 2022-04-06 DIAGNOSIS — R270 Ataxia, unspecified: Secondary | ICD-10-CM | POA: Diagnosis not present

## 2022-04-06 DIAGNOSIS — R531 Weakness: Secondary | ICD-10-CM | POA: Diagnosis not present

## 2022-04-06 NOTE — Patient Instructions (Signed)
-   check labs, MRI lumbar spine, then

## 2022-04-06 NOTE — Progress Notes (Signed)
[facility]  PATIENT: James Roberson DOB: 1946-01-23  REFERRING CLINICIAN: Lisbeth Renshaw, MD HISTORY FROM: patient  REASON FOR VISIT: new consult    HISTORICAL  CHIEF COMPLAINT:  Chief Complaint  Patient presents with   New Patient (Initial Visit)    Pt is feeling not great. He states having increased and constant neck and head pain.Blurred vision with trouble sleeping and fatigue. Some bladder incontinence, constipation, and frequent vomitting. Arm pain from shoulders to hands, tingling sensation (pins and needles),heaviness on left side, some tremors. Balance problems. He states he has fell twice in last 2.5 weeks. Leg pain, slow shuffling gait, unsteady walking, left foot  heaviness. Room 6 with wife    HISTORY OF PRESENT ILLNESS:   76 year old male here for evaluation of gait and balance difficulty for past 2 years.  Gradual onset progressive stiff, slow, shuffling gait.  Having issues with numbness and tingling in hands and arms.  More difficulty with coordination of upper extremities.  Symptoms more on the left the right side.  Also had some progressive fatigue, constipation, visual abnormalities.  Has had multiple falls.  History of diabetes, cervical discectomy, and low back surgeries in the past.   REVIEW OF SYSTEMS: Full 14 system review of systems performed and negative with exception of: As per HPI.  ALLERGIES: Allergies  Allergen Reactions   Avelox Abc [Moxifloxacin] Anaphylaxis   Tape Rash    PAPER TAPE ONLY!!   Ambien Cr [Zolpidem Tartrate Er] Other (See Comments)     Hallucinations   Zolpidem Other (See Comments)     Hallucinations - name brand only - tolerates generic    Other Rash    Nuderm and Coban     HOME MEDICATIONS: Outpatient Medications Prior to Visit  Medication Sig Dispense Refill   amLODipine (NORVASC) 5 MG tablet Take 5 mg by mouth daily.     aspirin (ASPIRIN 81) 81 MG chewable tablet 1 tablet     atorvastatin  (LIPITOR) 80 MG tablet Take 1 tablet (80 mg total) by mouth at bedtime. 90 tablet 3   buPROPion (WELLBUTRIN XL) 150 MG 24 hr tablet Take 150 mg by mouth every morning.     gabapentin (NEURONTIN) 300 MG capsule Take 600 mg by mouth at bedtime.     lisinopril (ZESTRIL) 40 MG tablet Take 40 mg by mouth daily.     Melatonin 10 MG TABS      metFORMIN (GLUCOPHAGE-XR) 500 MG 24 hr tablet Take 1,000 mg by mouth 2 (two) times daily.     TRULICITY 1.5 MG/0.5ML SOPN Inject 0.5 mLs into the skin every Friday.     zaleplon (SONATA) 5 MG capsule Take 5-10 mg by mouth at bedtime as needed.     metFORMIN (GLUCOPHAGE) 500 MG tablet 2 tablet with meals     No facility-administered medications prior to visit.    PAST MEDICAL HISTORY: Past Medical History:  Diagnosis Date   Diabetes mellitus without complication (HCC)    Hyperlipidemia    Hypertension    Intervertebral disk disease    cervical   Obesity    Psoriasis    Tremor     PAST SURGICAL HISTORY: Past Surgical History:  Procedure Laterality Date   BACK SURGERY     I & D EXTREMITY Right 07/20/2018   Procedure: IRRIGATION AND DEBRIDEMENT  OF LEG;  Surgeon: Tarry Kos, MD;  Location: [facility] OR;  Service: Orthopedics;  Laterality: Right;   LEG SURGERY  LUMBAR LAMINECTOMY  1989    FAMILY HISTORY: Family History  Problem Relation Age of Onset   CAD Father     SOCIAL HISTORY: Social History   Socioeconomic History   Marital status: Married    Spouse name: Not on file   Number of children: Not on file   Years of education: Not on file   Highest education level: Not on file  Occupational History   Not on file  Tobacco Use   Smoking status: Former    Types: Cigarettes    Quit date: 07/23/1966    Years since quitting: 55.7   Smokeless tobacco: Never  Substance and Sexual Activity   Alcohol use: Yes    Comment: 1 a quarter   Drug use: Not on file   Sexual activity: Not on file  Other Topics Concern   Not on file  Social  History Narrative   Lives with wife   Social Determinants of Health   Financial Resource Strain: Not on file  Food Insecurity: Not on file  Transportation Needs: Not on file  Physical Activity: Not on file  Stress: Not on file  Social Connections: Not on file  Intimate Partner Violence: Not on file     PHYSICAL EXAM  GENERAL EXAM/CONSTITUTIONAL: Vitals:  Vitals:   04/06/22 1343  BP: (!) 151/78  Pulse: 81  Weight: 185 lb (83.9 kg)  Height: 5\' 9"  (1.753 m)   Body mass index is 27.32 kg/m. Wt Readings from Last 3 Encounters:  04/06/22 185 lb (83.9 kg)  10/26/21 180 lb (81.6 kg)  07/08/21 181 lb (82.1 kg)   Patient is in no distress; well developed, nourished and groomed; neck is supple  CARDIOVASCULAR: Examination of carotid arteries is normal; no carotid bruits Regular rate and rhythm, no murmurs Examination of peripheral vascular system by observation and palpation is normal  EYES: Ophthalmoscopic exam of optic discs and posterior segments is normal; no papilledema or hemorrhages No results found.  MUSCULOSKELETAL: Gait, strength, tone, movements noted in Neurologic exam below  NEUROLOGIC: MENTAL STATUS:      No data to display         awake, alert, oriented to person, place and time recent and remote memory intact normal attention and concentration language fluent, comprehension intact, naming intact fund of knowledge appropriate  CRANIAL NERVE:  2nd - no papilledema on fundoscopic exam 2nd, 3rd, 4th, 6th - pupils equal and reactive to light, visual fields full to confrontation, extraocular muscles intact, no nystagmus 5th - facial sensation symmetric 7th - facial strength symmetric 8th - hearing intact 9th - palate elevates symmetrically, uvula midline 11th - shoulder shrug symmetric 12th - tongue protrusion midline  MOTOR:  normal bulk and tone, full strength in the BUE, BLE  SENSORY:  normal and symmetric to light touch, temperature,  vibration  COORDINATION:  finger-nose-finger, fine finger movements normal  REFLEXES:  deep tendon reflexes present and symmetric  GAIT/STATION:  narrow based gait     DIAGNOSTIC DATA (LABS, IMAGING, TESTING) - I reviewed patient records, labs, notes, testing and imaging myself where available.  Lab Results  Component Value Date   WBC 10.8 (H) 10/26/2021   HGB 13.9 10/26/2021   HCT 43.2 10/26/2021   MCV 87.8 10/26/2021   PLT 362 10/26/2021      Component Value Date/Time   NA 136 10/26/2021 1301   K 4.7 10/26/2021 1301   CL 101 10/26/2021 1301   CO2 17 (L) 10/26/2021 1301   GLUCOSE 131 (  H) 10/26/2021 1301   BUN 34 (H) 10/26/2021 1301   CREATININE 1.61 (H) 10/26/2021 1301   CALCIUM 10.3 10/26/2021 1301   PROT 6.9 03/30/2022 1334   ALBUMIN 4.5 03/30/2022 1334   AST 18 03/30/2022 1334   ALT 26 03/30/2022 1334   ALKPHOS 90 03/30/2022 1334   BILITOT <0.2 03/30/2022 1334   GFRNONAA 44 (L) 10/26/2021 1301   GFRAA 52 (L) 12/30/2019 1835   Lab Results  Component Value Date   CHOL 259 (H) 03/30/2022   HDL 66 03/30/2022   LDLCALC 158 (H) 03/30/2022   TRIG 193 (H) 03/30/2022   CHOLHDL 3.9 03/30/2022   Lab Results  Component Value Date   HGBA1C 8.0 (H) 11/10/2014   No results found for: "VITAMINB12" Lab Results  Component Value Date   TSH 2.752 Test methodology is 3rd generation TSH 10/29/2007    02/26/22 MRI brain [I reviewed images myself and agree with interpretation. -VRP]  1. No specific cause for symptoms. No significant change since 2018. 2. 3 cm arachnoid cyst at the high left frontal convexity. 3. Generalized atrophy.   02/26/22 MRI cervical spine [I reviewed images myself and agree with interpretation. -VRP]  1. ACDF with solid arthrodesis from C3-C6. No recurrent impingement. 2. Facet osteoarthritis at C2-3 and C7-T1 with mild anterolisthesis. Moderate left foraminal narrowing at C7-T1.     ASSESSMENT AND PLAN  76 y.o. year old male here  with:   Dx:  1. Weakness   2. Ataxia      PLAN:  GAIT DIFF, TREMOR, ATAXIA, FATIGABLE WEAKNESS, DOUBLE VISION,  MAGNETIC GAIT, INCREASED TONE, INCONTINENCE, NAUSEA, VOMITING (concern for underlying neurodegenerative dz; ddx: parkinson's plus syndrome, multiple system atrophy, spinocerebellar ataxia, vitamin deficiency, myasthesnia gravis) - labs: AchR, SCA panel, paraneoplastic panel - MRI lumbar spine - then consider DATscan - then consider carb/levo trial - consider walker, PT evaluation  Orders Placed This Encounter  Procedures   MR LUMBAR SPINE WO CONTRAST   AChR Abs with Reflex to MuSK   Comp Spinocerebellar Ataxia   Autoimmune Neurology Ab   Vitamin E   Vitamin B12   Return in about 3 months (around 07/07/2022).  I spent 65 minutes of face-to-face and non-face-to-face time with patient.  This included previsit chart review, lab review, study review, order entry, electronic health record documentation, patient education.     Penni Bombard, MD [id number], [id number] PM Certified in Neurology, Neurophysiology and Neuroimaging  [facility] [address], [place], [place] [address] [phone]

## 2022-04-08 ENCOUNTER — Ambulatory Visit: Payer: Medicare PPO | Admitting: Diagnostic Neuroimaging

## 2022-04-15 ENCOUNTER — Telehealth: Payer: Self-pay | Admitting: Diagnostic Neuroimaging

## 2022-04-15 NOTE — Telephone Encounter (Signed)
James Roberson Berkley Harvey: [id number] exp. 04/15/22-05/15/22 sent to GI

## 2022-04-21 DIAGNOSIS — G5602 Carpal tunnel syndrome, left upper limb: Secondary | ICD-10-CM | POA: Diagnosis not present

## 2022-04-21 DIAGNOSIS — E1140 Type 2 diabetes mellitus with diabetic neuropathy, unspecified: Secondary | ICD-10-CM | POA: Diagnosis not present

## 2022-04-27 ENCOUNTER — Other Ambulatory Visit: Payer: Medicare PPO

## 2022-04-30 LAB — MUSK ANTIBODIES: MuSK Antibodies: <1 U/mL

## 2022-04-30 LAB — AUTOIMMUNE NEUROLOGY AB
AGNA-1: NEGATIVE
AMPA-R1 Antibody: NEGATIVE
AMPA-R2 Antibody: NEGATIVE
Amphiphysin Antibody: NEGATIVE
Anti-Hu Ab: NEGATIVE
Anti-Ri Ab: NEGATIVE
Anti-Yo Ab: NEGATIVE
Antineruonal nuclear Ab Type 3: NEGATIVE
Aquaporin 4 Antibody: NEGATIVE
CASPR2 Antibody,Cell-based IFA: NEGATIVE
CRMP-5 IgG: NEGATIVE
DNER Antibody: NEGATIVE
DPPX Antibody: NEGATIVE
GABA-B-R Antibody: NEGATIVE
GAD65 Antibody: NEGATIVE
ITPR1 Antibody: NEGATIVE
IgLON5 Antibody: NEGATIVE
Interpretation: NEGATIVE
LGI1 Antibody, Cell-based IFA: NEGATIVE
Ma2/Ta Antibody: NEGATIVE
NMDA-R Antibody: NEGATIVE
Purkinje Cell Cyto Ab Type 2: NEGATIVE
Purkinje Cell Cyto Ab Type Tr: NEGATIVE
VGCC Antibody: 4.3 pmol/L (ref 0.0–30.0)
Zic4 Antibody: NEGATIVE
mGluR1 Antibody: NEGATIVE

## 2022-04-30 LAB — COMP SPINOCEREBELLAR ATAXIA

## 2022-04-30 LAB — VITAMIN E
Vitamin E (Alpha Tocopherol): 21.8 mg/L (ref 9.0–29.0)
Vitamin E(Gamma Tocopherol): 1.3 mg/L (ref 0.5–4.9)

## 2022-04-30 LAB — VITAMIN B12: Vitamin B-12: 1108 pg/mL (ref 232–1245)

## 2022-04-30 LAB — ACHR ABS WITH REFLEX TO MUSK: AChR Binding Ab, Serum: <0.03 nmol/L (ref 0.00–0.24)

## 2022-05-06 ENCOUNTER — Ambulatory Visit
Admission: RE | Admit: 2022-05-06 | Discharge: 2022-05-06 | Disposition: A | Discharge status: Home / Self Care | Payer: Medicare PPO | Source: Ambulatory Visit | Attending: Diagnostic Neuroimaging | Admitting: Diagnostic Neuroimaging

## 2022-05-06 DIAGNOSIS — R270 Ataxia, unspecified: Secondary | ICD-10-CM

## 2022-05-06 DIAGNOSIS — R531 Weakness: Secondary | ICD-10-CM

## 2022-05-06 DIAGNOSIS — M48061 Spinal stenosis, lumbar region without neurogenic claudication: Secondary | ICD-10-CM | POA: Diagnosis not present

## 2022-05-11 DIAGNOSIS — K5900 Constipation, unspecified: Secondary | ICD-10-CM | POA: Diagnosis not present

## 2022-05-31 DIAGNOSIS — R1084 Generalized abdominal pain: Secondary | ICD-10-CM | POA: Diagnosis not present

## 2022-05-31 DIAGNOSIS — R112 Nausea with vomiting, unspecified: Secondary | ICD-10-CM | POA: Diagnosis not present

## 2022-06-02 DIAGNOSIS — R109 Unspecified abdominal pain: Secondary | ICD-10-CM | POA: Diagnosis not present

## 2022-06-08 DIAGNOSIS — R1013 Epigastric pain: Secondary | ICD-10-CM | POA: Diagnosis not present

## 2022-06-08 DIAGNOSIS — R112 Nausea with vomiting, unspecified: Secondary | ICD-10-CM | POA: Diagnosis not present

## 2022-06-08 DIAGNOSIS — E1165 Type 2 diabetes mellitus with hyperglycemia: Secondary | ICD-10-CM | POA: Diagnosis not present

## 2022-06-21 DIAGNOSIS — K319 Disease of stomach and duodenum, unspecified: Secondary | ICD-10-CM | POA: Diagnosis not present

## 2022-06-21 DIAGNOSIS — K2100 Gastro-esophageal reflux disease with esophagitis, without bleeding: Secondary | ICD-10-CM | POA: Diagnosis not present

## 2022-06-21 DIAGNOSIS — K2990 Gastroduodenitis, unspecified, without bleeding: Secondary | ICD-10-CM | POA: Diagnosis not present

## 2022-06-21 DIAGNOSIS — K2950 Unspecified chronic gastritis without bleeding: Secondary | ICD-10-CM | POA: Diagnosis not present

## 2022-06-21 DIAGNOSIS — R1013 Epigastric pain: Secondary | ICD-10-CM | POA: Diagnosis not present

## 2022-06-28 DIAGNOSIS — E118 Type 2 diabetes mellitus with unspecified complications: Secondary | ICD-10-CM | POA: Diagnosis not present

## 2022-06-28 DIAGNOSIS — E782 Mixed hyperlipidemia: Secondary | ICD-10-CM | POA: Diagnosis not present

## 2022-07-04 DIAGNOSIS — I10 Essential (primary) hypertension: Secondary | ICD-10-CM | POA: Diagnosis not present

## 2022-07-04 DIAGNOSIS — I700 Atherosclerosis of aorta: Secondary | ICD-10-CM | POA: Diagnosis not present

## 2022-07-04 DIAGNOSIS — E782 Mixed hyperlipidemia: Secondary | ICD-10-CM | POA: Diagnosis not present

## 2022-07-04 DIAGNOSIS — N1831 Chronic kidney disease, stage 3a: Secondary | ICD-10-CM | POA: Diagnosis not present

## 2022-07-04 DIAGNOSIS — E1165 Type 2 diabetes mellitus with hyperglycemia: Secondary | ICD-10-CM | POA: Diagnosis not present

## 2022-07-04 DIAGNOSIS — E118 Type 2 diabetes mellitus with unspecified complications: Secondary | ICD-10-CM | POA: Diagnosis not present

## 2022-07-05 ENCOUNTER — Ambulatory Visit: Payer: Medicare PPO | Admitting: Diagnostic Neuroimaging

## 2022-07-26 ENCOUNTER — Ambulatory Visit: Payer: Medicare PPO | Admitting: Diagnostic Neuroimaging

## 2022-07-26 ENCOUNTER — Encounter: Payer: Self-pay | Admitting: Diagnostic Neuroimaging

## 2022-07-26 VITALS — BP 160/79 | HR 72 | Ht 68.5 in | Wt 187.0 lb

## 2022-07-26 DIAGNOSIS — R270 Ataxia, unspecified: Secondary | ICD-10-CM | POA: Diagnosis not present

## 2022-07-26 DIAGNOSIS — R531 Weakness: Secondary | ICD-10-CM | POA: Diagnosis not present

## 2022-07-26 NOTE — Progress Notes (Signed)
[facility]  PATIENT: KADIEN LINEMAN DOB: [birth year]  REFERRING CLINICIAN: [name], MD HISTORY FROM: patient  REASON FOR VISIT: follow up   HISTORICAL  CHIEF COMPLAINT:  Chief Complaint  Patient presents with   Follow-up    Pt alone, rm 7. Pt here for follow up. He has had a increase in falls. States it has always been outside. Last night was first time he fell on concrete.     HISTORY OF PRESENT ILLNESS:   UPDATE (07/26/22, VRP): Since last visit, doing about the same. Still having numbness, balance issues, and falls. GI issues resolved since stopping trulicity. Other workup was negative.   PRIOR HPI (04/06/22): 76 year old male here for evaluation of gait and balance difficulty for past 2 years.  Gradual onset progressive stiff, slow, shuffling gait.  Having issues with numbness and tingling in hands and arms.  More difficulty with coordination of upper extremities.  Symptoms more on the left the right side.  Also had some progressive fatigue, constipation, visual abnormalities.  Has had multiple falls.  History of diabetes, cervical discectomy, and low back surgeries in the past.   REVIEW OF SYSTEMS: Full 14 system review of systems performed and negative with exception of: As per HPI.  ALLERGIES: Allergies  Allergen Reactions   Avelox Abc [Moxifloxacin] Anaphylaxis   Tape Rash    PAPER TAPE ONLY!!   Ambien Cr [Zolpidem Tartrate Er] Other (See Comments)     Hallucinations   Jardiance [Empagliflozin]    Mounjaro [Tirzepatide] Nausea Only    GI issues   Trulicity [Dulaglutide]     pancreatitis   Zolpidem Other (See Comments)     Hallucinations - name brand only - tolerates generic    Other Rash    Nuderm and Coban     HOME MEDICATIONS: Outpatient Medications Prior to Visit  Medication Sig Dispense Refill   amLODipine (NORVASC) 5 MG tablet Take 5 mg by mouth daily.     aspirin (ASPIRIN 81) 81 MG chewable tablet 1 tablet      atorvastatin (LIPITOR) 20 MG tablet Take 20 mg by mouth daily.     buPROPion (WELLBUTRIN XL) 150 MG 24 hr tablet Take 150 mg by mouth every morning.     gabapentin (NEURONTIN) 300 MG capsule Take 600 mg by mouth at bedtime.     lisinopril (ZESTRIL) 40 MG tablet Take 40 mg by mouth daily.     Melatonin 10 MG TABS      metFORMIN (GLUCOPHAGE-XR) 500 MG 24 hr tablet Take 1,000 mg by mouth 2 (two) times daily.     omeprazole (PRILOSEC) 40 MG capsule Take 40 mg by mouth daily.     atorvastatin (LIPITOR) 80 MG tablet Take 1 tablet (80 mg total) by mouth at bedtime. (Patient taking differently: Take 20 mg by mouth at bedtime.) 90 tablet 3   TRULICITY 1.5 MG/0.5ML SOPN Inject 0.5 mLs into the skin every Friday.     zaleplon (SONATA) 5 MG capsule Take 5-10 mg by mouth at bedtime as needed.     No facility-administered medications prior to visit.    PAST MEDICAL HISTORY: Past Medical History:  Diagnosis Date   Diabetes mellitus without complication (HCC)    Hyperlipidemia    Hypertension    Intervertebral disk disease    cervical   Obesity    Psoriasis    Tremor     PAST SURGICAL HISTORY: Past Surgical History:  Procedure Laterality Date   BACK SURGERY  I & D EXTREMITY Right 07/20/2018   Procedure: IRRIGATION AND DEBRIDEMENT  OF LEG;  Surgeon: Tarry Kos, MD;  Location: [facility] OR;  Service: Orthopedics;  Laterality: Right;   LEG SURGERY     LUMBAR LAMINECTOMY  1989    FAMILY HISTORY: Family History  Problem Relation Age of Onset   CAD Father     SOCIAL HISTORY: Social History   Socioeconomic History   Marital status: Married    Spouse name: Not on file   Number of children: Not on file   Years of education: Not on file   Highest education level: Not on file  Occupational History   Not on file  Tobacco Use   Smoking status: Former    Types: Cigarettes    Quit date: 07/23/1966    Years since quitting: 56.0   Smokeless tobacco: Never  Substance and Sexual Activity    Alcohol use: Yes    Comment: 1 a quarter   Drug use: Not on file   Sexual activity: Not on file  Other Topics Concern   Not on file  Social History Narrative   Lives with wife   Social Determinants of Health   Financial Resource Strain: Not on file  Food Insecurity: Not on file  Transportation Needs: Not on file  Physical Activity: Not on file  Stress: Not on file  Social Connections: Not on file  Intimate Partner Violence: Not on file     PHYSICAL EXAM  GENERAL EXAM/CONSTITUTIONAL: Vitals:  Vitals:   07/26/22 1323  BP: (!) 160/79  Pulse: 72  Weight: 187 lb (84.8 kg)  Height: 5' 8.5" (1.74 m)   Body mass index is 28.02 kg/m. Wt Readings from Last 3 Encounters:  07/26/22 187 lb (84.8 kg)  04/06/22 185 lb (83.9 kg)  10/26/21 180 lb (81.6 kg)   Patient is in no distress; well developed, nourished and groomed; neck is supple  CARDIOVASCULAR: Examination of carotid arteries is normal; no carotid bruits Regular rate and rhythm, no murmurs Examination of peripheral vascular system by observation and palpation is normal  EYES: Ophthalmoscopic exam of optic discs and posterior segments is normal; no papilledema or hemorrhages No results found.  MUSCULOSKELETAL: Gait, strength, tone, movements noted in Neurologic exam below  NEUROLOGIC: MENTAL STATUS:      No data to display         awake, alert, oriented to person, place and time recent and remote memory intact normal attention and concentration language fluent, comprehension intact, naming intact fund of knowledge appropriate  CRANIAL NERVE:  2nd - no papilledema on fundoscopic exam 2nd, 3rd, 4th, 6th - pupils equal and reactive to light, visual fields full to confrontation, extraocular muscles intact, no nystagmus 5th - facial sensation symmetric 7th - facial strength symmetric 8th - hearing intact 9th - palate elevates symmetrically, uvula midline 11th - shoulder shrug symmetric 12th - tongue  protrusion midline  MOTOR:  normal bulk and tone, full strength in the BUE, BLE  SENSORY:  normal and symmetric to light touch, temperature, vibration  COORDINATION:  finger-nose-finger, fine finger movements normal  REFLEXES:  deep tendon reflexes TRACE and symmetric  GAIT/STATION:  narrow based GAIT; UNSTEADY; CAUTIOUS; SLIGHTLY VEERING SIDE TO SIDE     DIAGNOSTIC DATA (LABS, IMAGING, TESTING) - I reviewed patient records, labs, notes, testing and imaging myself where available.  Lab Results  Component Value Date   WBC 10.8 (H) 10/26/2021   HGB 13.9 10/26/2021   HCT 43.2 10/26/2021  MCV 87.8 10/26/2021   PLT 362 10/26/2021      Component Value Date/Time   NA 136 10/26/2021 1301   K 4.7 10/26/2021 1301   CL 101 10/26/2021 1301   CO2 17 (L) 10/26/2021 1301   GLUCOSE 131 (H) 10/26/2021 1301   BUN 34 (H) 10/26/2021 1301   CREATININE 1.61 (H) 10/26/2021 1301   CALCIUM 10.3 10/26/2021 1301   PROT 6.9 03/30/2022 1334   ALBUMIN 4.5 03/30/2022 1334   AST 18 03/30/2022 1334   ALT 26 03/30/2022 1334   ALKPHOS 90 03/30/2022 1334   BILITOT <0.2 03/30/2022 1334   GFRNONAA 44 (L) 10/26/2021 1301   GFRAA 52 (L) 12/30/2019 1835   Lab Results  Component Value Date   CHOL 259 (H) 03/30/2022   HDL 66 03/30/2022   LDLCALC 158 (H) 03/30/2022   TRIG 193 (H) 03/30/2022   CHOLHDL 3.9 03/30/2022   Lab Results  Component Value Date   HGBA1C 8.0 (H) 11/10/2014   Lab Results  Component Value Date   VITAMINB12 1,108 04/06/2022   Lab Results  Component Value Date   TSH 2.752 Test methodology is 3rd generation TSH 10/29/2007   SCA panel - negative  Paraneoplastic panel - negative  02/26/22 MRI brain [I reviewed images myself and agree with interpretation. -VRP]  1. No specific cause for symptoms. No significant change since 2018. 2. 3 cm arachnoid cyst at the high left frontal convexity. 3. Generalized atrophy.   02/26/22 MRI cervical spine [I reviewed images myself  and agree with interpretation. -VRP]  1. ACDF with solid arthrodesis from C3-C6. No recurrent impingement. 2. Facet osteoarthritis at C2-3 and C7-T1 with mild anterolisthesis. Moderate left foraminal narrowing at C7-T1.  05/06/22 MRI of the lumbar spine without contrast shows the following: At L1-L2, there is minimal spinal stenosis due to mild retrolisthesis and other degenerative change.  There is moderately severe foraminal narrowing and left greater than right lateral recess stenosis with potential for L1-L2 nerve root compression. At L2-L3, there is mild spinal stenosis due to minimal retrolisthesis and other degenerative change.  There is moderately severe left lateral recess stenosis and moderate bilateral foraminal narrowing right lateral recess stenosis.  There is potential left L3 nerve root compression. At L3-L4, there is moderate spinal stenosis due to degenerative changes also causing moderately severe right foraminal narrowing and moderate bilateral lateral recess stenosis.  There is potential for right L3 nerve root compression. At L4-L5, there are degenerative changes causing foraminal and lateral recess stenosis but no spinal stenosis or nerve root compression. Milder degenerative changes at T12-L1 and L5-S1 do not lead to spinal stenosis or nerve root compression.    ASSESSMENT AND PLAN  76 y.o. year old male here with:   Dx:  1. Ataxia   2. Weakness     PLAN:  GAIT DIFF, SENSORY ATAXIA (likely related to lumbar spinal stenosis, radiculopathies + diabetic neuropathies) - use cane; consider PT evaluation  Return for pending if symptoms worsen or fail to improve, return to PCP.      Suanne Marker, MD 07/26/2022, 2:22 PM Certified in Neurology, Neurophysiology and Neuroimaging  [facility] [address] [place], [place] [address] [phone]

## 2022-07-26 NOTE — Patient Instructions (Signed)
  GAIT DIFF, SENSORY ATAXIA (likely related to lumbar spinal stenosis, radiculopathies + diabetic neuropathies) - use cane; consider PT evaluation

## 2022-08-17 DIAGNOSIS — K2100 Gastro-esophageal reflux disease with esophagitis, without bleeding: Secondary | ICD-10-CM | POA: Diagnosis not present

## 2022-08-27 ENCOUNTER — Other Ambulatory Visit: Payer: Self-pay | Admitting: Cardiovascular Disease

## 2022-08-27 DIAGNOSIS — R931 Abnormal findings on diagnostic imaging of heart and coronary circulation: Secondary | ICD-10-CM

## 2022-09-02 ENCOUNTER — Other Ambulatory Visit: Payer: Self-pay | Admitting: Cardiovascular Disease

## 2022-09-02 DIAGNOSIS — R931 Abnormal findings on diagnostic imaging of heart and coronary circulation: Secondary | ICD-10-CM

## 2022-09-22 ENCOUNTER — Other Ambulatory Visit (HOSPITAL_COMMUNITY): Payer: Self-pay

## 2022-09-22 ENCOUNTER — Ambulatory Visit: Payer: Medicare PPO | Attending: Cardiovascular Disease | Admitting: Cardiovascular Disease

## 2022-09-22 ENCOUNTER — Encounter: Payer: Self-pay | Admitting: Cardiovascular Disease

## 2022-09-22 ENCOUNTER — Telehealth: Payer: Self-pay | Admitting: Pharmacist

## 2022-09-22 VITALS — BP 144/64 | HR 81 | Ht 68.0 in | Wt 185.6 lb

## 2022-09-22 DIAGNOSIS — I10 Essential (primary) hypertension: Secondary | ICD-10-CM | POA: Diagnosis not present

## 2022-09-22 DIAGNOSIS — E785 Hyperlipidemia, unspecified: Secondary | ICD-10-CM

## 2022-09-22 DIAGNOSIS — R931 Abnormal findings on diagnostic imaging of heart and coronary circulation: Secondary | ICD-10-CM

## 2022-09-22 MED ORDER — REPATHA SURECLICK 140 MG/ML ~~LOC~~ SOAJ: 140.0000 mg | SUBCUTANEOUS | 3 refills | Status: DC

## 2022-09-22 NOTE — Patient Instructions (Addendum)
Medication Instructions:  Your physician recommends that you continue on your current medications as directed. Please refer to the Current Medication list given to you today.  *If you need a refill on your cardiac medications before your next appointment, please call your pharmacy*   Follow-Up: At [facility], you and your health needs are our priority.  As part of our continuing mission to provide you with exceptional heart care, we have created designated Provider Care Teams.  These Care Teams include your primary Cardiologist (physician) and Advanced Practice Providers (APPs -  Physician Assistants and Nurse Practitioners) who all work together to provide you with the care you need, when you need it.  We recommend signing up for the patient portal called "MyChart".  Sign up information is provided on this After Visit Summary.  MyChart is used to connect with patients for Virtual Visits (Telemedicine).  Patients are able to view lab/test results, encounter notes, upcoming appointments, etc.  Non-urgent messages can be sent to your provider as well.   To learn more about what you can do with MyChart, go to [email or web address].    Your next appointment:   3 month(s)  Provider:   Quay Burow, MD

## 2022-09-22 NOTE — Assessment & Plan Note (Signed)
Elevated coronary calcium score of 2643 performed 03/30/2021 with calcium distributed in all 3 coronary arteries.  I performed Myoview stress testing on him 07/08/2021 which was entirely normal.  He has had 3 episodes of chest pain since the beginning of January which were at rest, substernal and radiated to his throat.  He did join [employer] and has had no chest pain during exercise.  I am going to begin him on Repatha for his elevated cholesterol.  If he has continued recurrent chest pain I will perform myocardial PET perfusion imaging.

## 2022-09-22 NOTE — Telephone Encounter (Signed)
Pharmacy Patient Advocate Encounter  Prior Authorization for REPATHA 140 MG/ML INJ has been approved.    PA# [id number] Effective dates: 08/08/22 through 08/08/23   Received notification from [facility] that prior authorization for REPATHA 140 MG/ML INJ is needed.    PA submitted on 09/22/22 Key [id number] Status is pending  Karie Soda, [place] Patient Advocate Specialist Direct Number: [phone] Fax: [phone]

## 2022-09-22 NOTE — Addendum Note (Signed)
Addended by: Anda Latina on: 09/22/2022 12:44 PM   Modules accepted: Orders

## 2022-09-22 NOTE — Assessment & Plan Note (Signed)
History of essential to her blood pressure measured today at 144/64.  He is on amlodipine, and lisinopril.

## 2022-09-22 NOTE — Progress Notes (Signed)
Tubular     09/22/2022 James Roberson   [birth year]  EL:[id number]  Primary Physician Deland Pretty, MD Primary Cardiologist: Lorretta Harp MD FACP, [place], [place], [place]  HPI:  James Roberson is a 77 y.o.  mildly overweight married Caucasian male father of 2 living children (one child died of cancer), grandfather of 69 grandchildren who was referred by Dr. Shelia Media, his PCP for cardiovascular evaluation because of an elevated coronary calcium score performed 03/30/2021.  I last saw him in the office 06/08/2021.  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  He moved down the [place] 35 years ago to [place].  He never smoked. He does have a history of hypertension, diabetes and hyperlipidemia. His father did have a myocardial infarction. He is never had a heart attack or stroke. He denies chest pain or shortness of breath. A coronary calcium score performed 03/30/2021 was 2643 with calcium distributed in all 3 coronary arteries.  Since I saw him a year ago I did a Myoview stress test on him 07/08/2021 which was entirely normal.  I did increase his atorvastatin 80 mg a day however his most recent lipid profile performed 03/10/2021 revealed total cholesterol 259, LDL 158 and HDL of 66.  He recently joined [employer] which he is enjoying.  He has had 2-3 episodes of chest pain since the beginning of the year occurring at rest with radiation to his throat lasting minutes at a time.  He gets no chest pain during exercise.   Current Meds  Medication Sig   amLODipine (NORVASC) 5 MG tablet TAKE 1 TABLET (5 MG TOTAL) BY MOUTH DAILY.   aspirin (ASPIRIN 81) 81 MG chewable tablet 1 tablet   atorvastatin (LIPITOR) 20 MG tablet Take 20 mg by mouth daily.   buPROPion (WELLBUTRIN XL) 150 MG 24 hr tablet Take 150 mg by mouth every morning.   gabapentin (NEURONTIN) 300 MG capsule Take 600 mg by mouth at bedtime.   lisinopril (ZESTRIL) 40 MG tablet Take 40 mg by  mouth daily.   Melatonin 10 MG TABS    metFORMIN (GLUCOPHAGE-XR) 500 MG 24 hr tablet Take 1,000 mg by mouth 2 (two) times daily.     Allergies  Allergen Reactions   Avelox Abc [Moxifloxacin] Anaphylaxis   Tape Rash    PAPER TAPE ONLY!!   Ambien Cr [Zolpidem Tartrate Er] Other (See Comments)     Hallucinations   Jardiance [Empagliflozin]    Mounjaro [Tirzepatide] Nausea Only    GI issues   Trulicity [Dulaglutide]     pancreatitis   Zolpidem Other (See Comments)     Hallucinations - name brand only - tolerates generic    Other Rash    Nuderm and Coban     Social History   Socioeconomic History   Marital status: Married    Spouse name: Not on file   Number of children: Not on file   Years of education: Not on file   Highest education level: Not on file  Occupational History   Not on file  Tobacco Use   Smoking status: Former    Types: Cigarettes    Quit date: 07/23/1966    Years since quitting: 56.2   Smokeless tobacco: Never  Substance and Sexual Activity   Alcohol use: Yes    Comment: 1 a quarter   Drug use: Not on file   Sexual activity: Not on file  Other Topics Concern  Not on file  Social History Narrative   Lives with wife   Social Determinants of Health   Financial Resource Strain: Not on file  Food Insecurity: Not on file  Transportation Needs: Not on file  Physical Activity: Not on file  Stress: Not on file  Social Connections: Not on file  Intimate Partner Violence: Not on file     Review of Systems: General: negative for chills, fever, night sweats or weight changes.  Cardiovascular: negative for chest pain, dyspnea on exertion, edema, orthopnea, palpitations, paroxysmal nocturnal dyspnea or shortness of breath Dermatological: negative for rash Respiratory: negative for cough or wheezing Urologic: negative for hematuria Abdominal: negative for nausea, vomiting, diarrhea, bright red blood per rectum, melena, or hematemesis Neurologic:  negative for visual changes, syncope, or dizziness All other systems reviewed and are otherwise negative except as noted above.    Blood pressure (!) 144/64, pulse 81, height 5' 8"$  (1.727 m), weight 185 lb 9.6 oz (84.2 kg), SpO2 95 %.  General appearance: alert and no distress Neck: no adenopathy, no carotid bruit, no JVD, supple, symmetrical, trachea midline, and thyroid not enlarged, symmetric, no tenderness/mass/nodules Lungs: clear to auscultation bilaterally Heart: regular rate and rhythm, S1, S2 normal, no murmur, click, rub or gallop Extremities: extremities normal, atraumatic, no cyanosis or edema Pulses: 2+ and symmetric Skin: Skin color, texture, turgor normal. No rashes or lesions Neurologic: Grossly normal  EKG sinus rhythm at 81 without ST or T wave changes.  Personally reviewed this EKG.  ASSESSMENT AND PLAN:   HYPERLIPIDEMIA History of hyperlipidemia on high-dose statin therapy lipid profile performed 03/30/2022 revealing total cholesterol 259, LDL 158 and HDL of 66.  Given his high calcium score he is not at goal for secondary prevention.  I am going to begin him on Repatha with an LDL goal less than 70.  Essential hypertension History of essential to her blood pressure measured today at 144/64.  He is on amlodipine, and lisinopril.  Elevated coronary artery calcium score Elevated coronary calcium score of 2643 performed 03/30/2021 with calcium distributed in all 3 coronary arteries.  I performed Myoview stress testing on him 07/08/2021 which was entirely normal.  He has had 3 episodes of chest pain since the beginning of January which were at rest, substernal and radiated to his throat.  He did join [employer] and has had no chest pain during exercise.  I am going to begin him on Repatha for his elevated cholesterol.  If he has continued recurrent chest pain I will perform myocardial PET perfusion imaging.     Lorretta Harp MD FACP,FACC,FAHA, [facility] 09/22/2022 10:35  AM

## 2022-09-22 NOTE — Telephone Encounter (Signed)
Patient was seeing Dr.Berry today. Lipid consultation was requested. Reviewed PCSK-9 inhibitors.Discussed mechanisms of action, dosing, side effects and potential decreases in LDL cholesterol.  Also reviewed cost information and potential options for patient assistance.   Current Medications: Atorvastatin 80 mg daily  Intolerances: none Risk Factors: Elevated CAC score, T2DM, HTN LDL goal: <70 mg/dl  Diet: have been eating low fat, low salt food   Labs: Lipid Panel     Component Value Date/Time   CHOL 259 (H) 03/30/2022 1334   TRIG 193 (H) 03/30/2022 1334   HDL 66 03/30/2022 1334   CHOLHDL 3.9 03/30/2022 1334   CHOLHDL 3.6 09/04/2008 0520   VLDL 35 09/04/2008 0520   LDLCALC 158 (H) 03/30/2022 1334   LABVLDL 35 03/30/2022 1334    Past Medical History:  Diagnosis Date   Diabetes mellitus without complication (HCC)    Hyperlipidemia    Hypertension    Intervertebral disk disease    cervical   Obesity    Psoriasis    Tremor        Allergies  Allergen Reactions   Avelox Abc [Moxifloxacin] Anaphylaxis   Tape Rash    PAPER TAPE ONLY!!   Ambien Cr [Zolpidem Tartrate Er] Other (See Comments)     Hallucinations   Jardiance [Empagliflozin]    Mounjaro [Tirzepatide] Nausea Only    GI issues   Trulicity [Dulaglutide]     pancreatitis   Zolpidem Other (See Comments)     Hallucinations - name brand only - tolerates generic    Other Rash    Nuderm and Coban

## 2022-09-22 NOTE — Assessment & Plan Note (Signed)
History of hyperlipidemia on high-dose statin therapy lipid profile performed 03/30/2022 revealing total cholesterol 259, LDL 158 and HDL of 66.  Given his high calcium score he is not at goal for secondary prevention.  I am going to begin him on Repatha with an LDL goal less than 70.

## 2022-09-22 NOTE — Addendum Note (Signed)
Addended by: Anda Latina on: 09/22/2022 12:49 PM   Modules accepted: Orders

## 2022-09-23 ENCOUNTER — Other Ambulatory Visit (HOSPITAL_BASED_OUTPATIENT_CLINIC_OR_DEPARTMENT_OTHER): Payer: Self-pay

## 2022-09-23 NOTE — Addendum Note (Signed)
Addended by: Wonda Horner on: 09/23/2022 03:50 PM   Modules accepted: Orders

## 2022-09-30 ENCOUNTER — Ambulatory Visit: Payer: Medicare PPO | Admitting: Cardiovascular Disease

## 2022-10-04 DIAGNOSIS — E1165 Type 2 diabetes mellitus with hyperglycemia: Secondary | ICD-10-CM | POA: Diagnosis not present

## 2022-10-11 DIAGNOSIS — I700 Atherosclerosis of aorta: Secondary | ICD-10-CM | POA: Diagnosis not present

## 2022-10-11 DIAGNOSIS — N183 Chronic kidney disease, stage 3 unspecified: Secondary | ICD-10-CM | POA: Diagnosis not present

## 2022-10-11 DIAGNOSIS — I10 Essential (primary) hypertension: Secondary | ICD-10-CM | POA: Diagnosis not present

## 2022-10-11 DIAGNOSIS — E1165 Type 2 diabetes mellitus with hyperglycemia: Secondary | ICD-10-CM | POA: Diagnosis not present

## 2022-10-11 DIAGNOSIS — E118 Type 2 diabetes mellitus with unspecified complications: Secondary | ICD-10-CM | POA: Diagnosis not present

## 2022-10-17 ENCOUNTER — Telehealth: Payer: Self-pay | Admitting: Cardiovascular Disease

## 2022-10-17 NOTE — Telephone Encounter (Signed)
Pt c/o medication issue:  1. Name of Medication:   Evolocumab (REPATHA SURECLICK) [id number] MG/ML SOAJ    2. How are you currently taking this medication (dosage and times per day)? Inject 140 mg into the skin every 14 (fourteen) days.   3. Are you having a reaction (difficulty breathing--STAT)? No  4. What is your medication issue? Office would like a callback regarding pt being placed on above medication possibly from old lipid panel. She wants to know if she can send over newer lipid panel instead due to pt's improvement . Please advise.

## 2022-10-17 NOTE — Telephone Encounter (Signed)
Returned call to Chantilly at PCP office.   She states she saw patient last week and was informed he was started on Repatha.   Raquel Sarna was concerned that we did not have his updated labs from November.   Per Emily-atorvastatin was increased in August due to labs results.  Labs rechecked in November and LDL 61.   She is unsure if patient needs Repatha based on most recent lab results.    He has had 1 injection since Repatha was started (2/15).    She will fax over lab results from November.  Will route to MD and pharmD to review.

## 2022-10-18 NOTE — Telephone Encounter (Signed)
Lipid Panel Reviewed date:06/29/2022 12:40:17 PM Interpretation: Performing Lab: Notes/Report: FASTING Test performed by [facility], [facility] [address] [place] [address] Rosey Bath, MD, Laboratory Director CLIA: [id number]  Cholesterol 141 <200 mg/dL    Triglycerides 98 <150 mg/dL    HDL Cholesterol 60 >39 mg/dL    Cholesterol / HDL Ratio 2.35 0.00-4.99 Ratio    Non-HDL Cholesterol 81 <130 mg/dL    LDL Cholesterol (Calculation) 61 <130 mg/dL    Per CareEverywhere.   Lorretta Harp, MD  Patria Mane A, RNYesterday (2:43 PM)    If his LDL is truly 18, he does not need to be on Repatha.    Left message for Raquel Sarna to call back.

## 2022-10-21 NOTE — Telephone Encounter (Signed)
Spoke with Raquel Sarna from Dr. Pennie Banter office. Advised her of Dr. Kennon Holter recommendations. She will let pt know that he does not need to be on repatha. I have taken repatha off pt's medication list.

## 2022-12-16 DIAGNOSIS — B029 Zoster without complications: Secondary | ICD-10-CM | POA: Diagnosis not present

## 2022-12-21 ENCOUNTER — Ambulatory Visit: Payer: Medicare PPO

## 2022-12-21 ENCOUNTER — Ambulatory Visit: Payer: Medicare PPO | Admitting: Cardiovascular Disease

## 2023-01-01 ENCOUNTER — Emergency Department (HOSPITAL_BASED_OUTPATIENT_CLINIC_OR_DEPARTMENT_OTHER)
Admission: EM | Admit: 2023-01-01 | Discharge: 2023-01-01 | Disposition: A | Discharge status: Home / Self Care | Payer: Medicare PPO | Attending: Emergency Medicine | Admitting: Emergency Medicine

## 2023-01-01 ENCOUNTER — Other Ambulatory Visit: Payer: Self-pay

## 2023-01-01 ENCOUNTER — Encounter (HOSPITAL_BASED_OUTPATIENT_CLINIC_OR_DEPARTMENT_OTHER): Payer: Self-pay | Admitting: Emergency Medicine

## 2023-01-01 ENCOUNTER — Emergency Department (HOSPITAL_BASED_OUTPATIENT_CLINIC_OR_DEPARTMENT_OTHER): Payer: Medicare PPO

## 2023-01-01 ENCOUNTER — Emergency Department (HOSPITAL_BASED_OUTPATIENT_CLINIC_OR_DEPARTMENT_OTHER): Payer: Medicare PPO | Admitting: Radiology

## 2023-01-01 DIAGNOSIS — Z79899 Other long term (current) drug therapy: Secondary | ICD-10-CM | POA: Insufficient documentation

## 2023-01-01 DIAGNOSIS — R1900 Intra-abdominal and pelvic swelling, mass and lump, unspecified site: Secondary | ICD-10-CM

## 2023-01-01 DIAGNOSIS — R0789 Other chest pain: Secondary | ICD-10-CM | POA: Insufficient documentation

## 2023-01-01 DIAGNOSIS — R1032 Left lower quadrant pain: Secondary | ICD-10-CM | POA: Diagnosis not present

## 2023-01-01 DIAGNOSIS — K5730 Diverticulosis of large intestine without perforation or abscess without bleeding: Secondary | ICD-10-CM | POA: Diagnosis not present

## 2023-01-01 DIAGNOSIS — B028 Zoster with other complications: Secondary | ICD-10-CM | POA: Diagnosis not present

## 2023-01-01 DIAGNOSIS — R079 Chest pain, unspecified: Secondary | ICD-10-CM

## 2023-01-01 DIAGNOSIS — K8020 Calculus of gallbladder without cholecystitis without obstruction: Secondary | ICD-10-CM | POA: Diagnosis not present

## 2023-01-01 DIAGNOSIS — N281 Cyst of kidney, acquired: Secondary | ICD-10-CM | POA: Diagnosis not present

## 2023-01-01 DIAGNOSIS — I129 Hypertensive chronic kidney disease with stage 1 through stage 4 chronic kidney disease, or unspecified chronic kidney disease: Secondary | ICD-10-CM | POA: Diagnosis not present

## 2023-01-01 DIAGNOSIS — N189 Chronic kidney disease, unspecified: Secondary | ICD-10-CM | POA: Insufficient documentation

## 2023-01-01 DIAGNOSIS — E1122 Type 2 diabetes mellitus with diabetic chronic kidney disease: Secondary | ICD-10-CM | POA: Diagnosis not present

## 2023-01-01 DIAGNOSIS — B029 Zoster without complications: Secondary | ICD-10-CM | POA: Diagnosis not present

## 2023-01-01 DIAGNOSIS — I10 Essential (primary) hypertension: Secondary | ICD-10-CM | POA: Diagnosis not present

## 2023-01-01 LAB — COMPREHENSIVE METABOLIC PANEL
ALT: 22 U/L (ref 0–44)
AST: 16 U/L (ref 15–41)
Albumin: 4.6 g/dL (ref 3.5–5.0)
Alkaline Phosphatase: 73 U/L (ref 38–126)
Anion gap: 13 (ref 5–15)
BUN: 33 mg/dL — ABNORMAL HIGH (ref 8–23)
CO2: 19 mmol/L — ABNORMAL LOW (ref 22–32)
Calcium: 10.2 mg/dL (ref 8.9–10.3)
Chloride: 102 mmol/L (ref 98–111)
Creatinine, Ser: 1.94 mg/dL — ABNORMAL HIGH (ref 0.61–1.24)
GFR, Estimated: 35 mL/min — ABNORMAL LOW (ref >60)
Glucose, Bld: 320 mg/dL — ABNORMAL HIGH (ref 70–99)
Potassium: 5.3 mmol/L — ABNORMAL HIGH (ref 3.5–5.1)
Sodium: 134 mmol/L — ABNORMAL LOW (ref 135–145)
Total Bilirubin: 0.4 mg/dL (ref 0.3–1.2)
Total Protein: 7.8 g/dL (ref 6.5–8.1)

## 2023-01-01 LAB — CBC WITH DIFFERENTIAL/PLATELET
Abs Immature Granulocytes: 0.02 10*3/uL (ref 0.00–0.07)
Basophils Absolute: 0.1 10*3/uL (ref 0.0–0.1)
Basophils Relative: 1 %
Eosinophils Absolute: 0.3 10*3/uL (ref 0.0–0.5)
Eosinophils Relative: 5 %
HCT: 38.9 % — ABNORMAL LOW (ref 39.0–52.0)
Hemoglobin: 12.8 g/dL — ABNORMAL LOW (ref 13.0–17.0)
Immature Granulocytes: 0 %
Lymphocytes Relative: 25 %
Lymphs Abs: 1.5 10*3/uL (ref 0.7–4.0)
MCH: 29.4 pg (ref 26.0–34.0)
MCHC: 32.9 g/dL (ref 30.0–36.0)
MCV: 89.4 fL (ref 80.0–100.0)
Monocytes Absolute: 0.3 10*3/uL (ref 0.1–1.0)
Monocytes Relative: 5 %
Neutro Abs: 3.8 10*3/uL (ref 1.7–7.7)
Neutrophils Relative %: 64 %
Platelets: 340 10*3/uL (ref 150–400)
RBC: 4.35 MIL/uL (ref 4.22–5.81)
RDW: 13.6 % (ref 11.5–15.5)
WBC: 6 10*3/uL (ref 4.0–10.5)
nRBC: 0 % (ref 0.0–0.2)

## 2023-01-01 LAB — TROPONIN I (HIGH SENSITIVITY)
Troponin I (High Sensitivity): 3 ng/L (ref <18)
Troponin I (High Sensitivity): 3 ng/L (ref <18)

## 2023-01-01 MED ORDER — IOHEXOL 300 MG/ML  SOLN
100.0000 mL | Freq: Once | INTRAMUSCULAR | Status: AC | PRN
  Administered 2023-01-01: 80 mL via INTRAVENOUS

## 2023-01-01 MED ORDER — SODIUM CHLORIDE 0.9 % IV BOLUS
1000.0000 mL | Freq: Once | INTRAVENOUS | Status: AC
  Administered 2023-01-01: 1000 mL via INTRAVENOUS

## 2023-01-01 NOTE — Discharge Instructions (Addendum)
Please read and follow all provided instructions.  Your diagnoses today include:  1. Abdominal wall swelling   2. Left-sided chest pain   3. Herpes zoster with complication     Tests performed today include: Complete blood cell count:  Basic metabolic panel: Your kidney function/creatinine was slightly elevated from what it was about a year ago, 1.9 today up from 1.6 a year ago.  Please have this continued to be monitored by your primary care or nephrologist.  Your blood sugar was also elevated at 320, continue home medications for diabetes. Troponin: Blood test for stress on the heart was normal CT of the abdomen and pelvis: Did not show any signs of hernia, abscess or other problems at the site of the swelling of your abdomen.  You also have a gallstone in the gallbladder without signs of gallbladder infection.  This is not likely causing the symptoms that you are feeling on the left side. Vital signs. See below for your results today.   Medications prescribed:  None  Take any prescribed medications only as directed.  Home care instructions:  Follow any educational materials contained in this packet.  Follow-up instructions: Please follow-up with your primary care provider in the next 3 days for further evaluation of your symptoms.   Return instructions:  Please return to the Emergency Department if you experience worsening symptoms. Return with worsening pain, swelling, fever, vomiting. Please return if you have any other emergent concerns.  Additional Information:  Your vital signs today were: BP (!) 150/78   Pulse 93   Temp 98.2 F (36.8 C) (Oral)   Resp 19   Wt 82.1 kg   SpO2 100%   BMI 27.52 kg/m  If your blood pressure (BP) was elevated above 135/85 this visit, please have this repeated by your doctor within one month. --------------

## 2023-01-01 NOTE — ED Provider Notes (Signed)
[place] EMERGENCY DEPARTMENT AT [facility] Provider Note   CSN: [id number] Arrival date & time: 01/01/23  1521     History  Chief Complaint  Patient presents with   Chest Pain    James Roberson is a 77 y.o. male.  Patient with history of hypertension, hyperlipidemia, diabetes, chronic kidney disease --presents to the emergency department for evaluation of left sided abdominal swelling and left-sided chest pain.  Patient was diagnosed with shingles in the abdomen extending around to the back about 2 weeks ago.  He has been on a total of 10 days of antiviral medication, completed yesterday.  The shingles rash is starting to fade however he continues to have pain and tenderness in the area.  Starting about 2 to 3 days after the rash began, he developed lateral abdominal wall swelling described as a "lump" of the abdominal wall.  Also, over the past several days he has developed a burning aching pain in the left chest as well.  This has not been associated with the rash.  He feels that it is similar in quality to the shingles pain that he had earlier.  No associated shortness of breath, cough, fever.       Home Medications Prior to Admission medications   Medication Sig Start Date End Date Taking? Authorizing Provider  amLODipine (NORVASC) 5 MG tablet TAKE 1 TABLET (5 MG TOTAL) BY MOUTH DAILY. 08/29/22   [name], MD  aspirin (ASPIRIN 81) 81 MG chewable tablet 1 tablet 04/06/21   [provider]  atorvastatin (LIPITOR) 80 MG tablet Take 80 mg by mouth daily.    [provider]  buPROPion (WELLBUTRIN XL) 150 MG 24 hr tablet Take 150 mg by mouth every morning.    [provider]  gabapentin (NEURONTIN) 300 MG capsule Take 600 mg by mouth at bedtime.    [provider]  lisinopril (ZESTRIL) 40 MG tablet Take 40 mg by mouth daily.    [provider]  Melatonin 10 MG TABS     [provider]  metFORMIN (GLUCOPHAGE-XR)  500 MG 24 hr tablet Take 1,000 mg by mouth 2 (two) times daily.    [provider]      Allergies    Avelox abc [moxifloxacin], Tape, Ambien cr [zolpidem tartrate er], Jardiance [empagliflozin], Mounjaro [tirzepatide], Trulicity [dulaglutide], Zolpidem, and Other    Review of Systems   Review of Systems  Physical Exam Updated Vital Signs BP (!) 150/69 (BP Location: Right Arm)   Pulse 92   Temp 98.2 F (36.8 C) (Oral)   Resp 18   Wt 82.1 kg   SpO2 99%   BMI 27.52 kg/m  Physical Exam Vitals and nursing note reviewed.  Constitutional:      General: He is not in acute distress.    Appearance: He is well-developed.  HENT:     Head: Normocephalic and atraumatic.  Eyes:     General:        Right eye: No discharge.        Left eye: No discharge.     Conjunctiva/sclera: Conjunctivae normal.  Cardiovascular:     Rate and Rhythm: Normal rate and regular rhythm.     Heart sounds: Normal heart sounds.  Pulmonary:     Effort: Pulmonary effort is normal.     Breath sounds: Normal breath sounds.  Chest:     Chest wall: Tenderness present.     Comments: Tenderness palpation over the left anterior chest  wall.  Patient winces when I push on certain areas that reproduces his pain.  No overlying rash consistent with shingles or cellulitis. Abdominal:     Palpations: Abdomen is soft.     Tenderness: There is no abdominal tenderness.  Musculoskeletal:     Cervical back: Normal range of motion and neck supple.  Skin:    General: Skin is warm and dry.     Comments: Patient with rash that extends from the lower back around the flank to the left anterior abdominal wall.  This is fading and consistent with shingles.  Patient has an irregular area of induration and soft tissue swelling without overlying erythema roughly associated with the area of the shingles rash over the lateral abdominal wall.  No fluctuance.  Neurological:     Mental Status: He is alert.     ED Results /  Procedures / Treatments   Labs (all labs ordered are listed, but only abnormal results are displayed) Labs Reviewed  CBC WITH DIFFERENTIAL/PLATELET - Abnormal; Notable for the following components:      Result Value   Hemoglobin 12.8 (*)    HCT 38.9 (*)    All other components within normal limits  COMPREHENSIVE METABOLIC PANEL - Abnormal; Notable for the following components:   Sodium 134 (*)    Potassium 5.3 (*)    CO2 19 (*)    Glucose, Bld 320 (*)    BUN 33 (*)    Creatinine, Ser 1.94 (*)    GFR, Estimated 35 (*)    All other components within normal limits  TROPONIN I (HIGH SENSITIVITY)  TROPONIN I (HIGH SENSITIVITY)    EKG EKG Interpretation  Date/Time:  Sunday Jan 01 2023 15:37:10 EDT Ventricular Rate:  96 PR Interval:  166 QRS Duration: 74 QT Interval:  318 QTC Calculation: 401 R Axis:   -38 Text Interpretation: Normal sinus rhythm Left axis deviation Low voltage QRS Cannot rule out Anterior infarct , age undetermined Abnormal ECG No significant change since last tracing Confirmed by Elayne Snare ([id number]) on 01/01/2023 4:40:24 PM  Radiology No results found.  Procedures Procedures    Medications Ordered in ED Medications - No data to display  ED Course/ Medical Decision Making/ A&P    Patient seen and examined. History obtained directly from patient and wife at bedside who gives additional history details.  Overall it is unclear what symptoms are directly related to the shingles and what is not.  The rash itself appears to be resolving.  He has not developed any new rash since stopping the antiviral.  He does have a burning tingling pain in his chest which is removed from the area where the shingles was present.  He also has abdominal wall swelling that does not feel like an abscess or appear to be cellulitic and the etiology of this is unclear.  Labs/EKG: Ordered CBC, CMP, troponin, EKG.  Imaging: Will order CT imaging depending upon renal  function  Medications/Fluids: Ordered: None ordered.   Most recent vital signs reviewed and are as follows: BP 131/64   Pulse 91   Temp 98.2 F (36.8 C) (Oral)   Resp (!) 21   Wt 82.1 kg   SpO2 97%   BMI 27.52 kg/m   Initial impression: Patient with left flank and chest pain, left abdominal wall swelling, recent shingles.  4:45 PM Patient discussed with and seen by Dr. Theresia Lo.   6:57 PM Reassessment performed. Patient appears stable.  Awaiting second troponin.  Labs personally  reviewed and interpreted including: CBC with normal white blood cell count, hemoglobin slightly low at 12.8, normochromic otherwise unremarkable CBC; CMP with minimal elevation in creatinine up from baseline a year ago (1.6 > 1.9), BUN minimally elevated at 33, normal liver function test, glucose 320 with normal anion gap, mild hyperkalemia at 5.3; initial troponin 3.  Imaging personally visualized and interpreted including: CT of the abdomen pelvis shows a gallstone, no significant findings at the site of the soft tissue swelling of the left abdomen.  Reviewed pertinent lab work and imaging with patient at bedside. Questions answered.   Most current vital signs reviewed and are as follows: BP (!) 150/78   Pulse 93   Temp 98.2 F (36.8 C) (Oral)   Resp 19   Wt 82.1 kg   SpO2 100%   BMI 27.52 kg/m   Plan: Discharge to home after second troponin results.  Prescriptions written for: None  Other home care instructions discussed: Maintain good hydration, continued use of gabapentin, OTC meds for discomfort.  ED return instructions discussed: The patient was urged to return to the Emergency Department immediately with worsening of current symptoms, worsening abdominal pain, persistent vomiting, blood noted in stools, fever, or any other concerns.  Also return with increasing chest pain, shortness of breath or trouble breathing.  Follow-up instructions discussed: Patient encouraged to follow-up with  their PCP in 3 days.                             Medical Decision Making Amount and/or Complexity of Data Reviewed Labs: ordered. Radiology: ordered.  Risk Prescription drug management.   Patient with several problems today.  He has zoster infection which is resolving on the left lateral abdomen.  He has completed course of antiviral and has not had worsening rash.  Will continue to monitor.  He does have some neuralgia in this area, currently on gabapentin.  Encouraged to follow-up with PCP to determine if they want to adjust this dose.  He also had abdominal wall swelling which was evaluated with CT.  No overlying erythema or cellulitis.  CT does not demonstrate a hernia, abscess, or other abnormality which require further consult or treatment at this time.  Patient encouraged to monitor.  This may be just some soft tissue swelling from the overlying shingles rash.  He also had some tingling and burning pain in the left chest.  Cardiac evaluation was undertaken.  First troponin normal.  EKG nonischemic.  Chest x-ray clear.  Very atypical for ACS, PE.  No signs of pneumothorax or pneumonia on x-ray.  Chronic kidney disease is near baseline.  Last data point that we have is from a year ago.  Creatinine is slightly elevated at 1.9 today up from 1.6 a year ago.  He has mild hyperkalemia without EKG changes.  Encouraged that he continue to have this monitored by PCP.  The patient's vital signs, pertinent lab work and imaging were reviewed and interpreted as discussed in the ED course. Hospitalization was considered for further testing, treatments, or serial exams/observation. However as patient is well-appearing, has a stable exam, and reassuring studies today, I do not feel that they warrant admission at this time. This plan was discussed with the patient who verbalizes agreement and comfort with this plan and seems reliable and able to return to the Emergency Department with worsening or changing  symptoms.          Final Clinical  Impression(s) / ED Diagnoses Final diagnoses:  Abdominal wall swelling  Left-sided chest pain  Herpes zoster with complication    Rx / DC Orders ED Discharge Orders     None         [name], PA-C 01/01/23 1903    Rexford Maus, DO 01/01/23 2300

## 2023-01-01 NOTE — ED Triage Notes (Signed)
Pt has shingles, treated with 2 weeks antivirals. Has small amount of rash left to his left back. Pain from lower back has progressed to his left chest and has a new bulge to his left flank.

## 2023-01-06 DIAGNOSIS — M5410 Radiculopathy, site unspecified: Secondary | ICD-10-CM | POA: Diagnosis not present

## 2023-01-06 DIAGNOSIS — B0229 Other postherpetic nervous system involvement: Secondary | ICD-10-CM | POA: Diagnosis not present

## 2023-01-31 DIAGNOSIS — E1165 Type 2 diabetes mellitus with hyperglycemia: Secondary | ICD-10-CM | POA: Diagnosis not present

## 2023-01-31 DIAGNOSIS — I10 Essential (primary) hypertension: Secondary | ICD-10-CM | POA: Diagnosis not present

## 2023-01-31 DIAGNOSIS — N183 Chronic kidney disease, stage 3 unspecified: Secondary | ICD-10-CM | POA: Diagnosis not present

## 2023-02-14 DIAGNOSIS — I10 Essential (primary) hypertension: Secondary | ICD-10-CM | POA: Diagnosis not present

## 2023-02-14 DIAGNOSIS — E1165 Type 2 diabetes mellitus with hyperglycemia: Secondary | ICD-10-CM | POA: Diagnosis not present

## 2023-02-14 DIAGNOSIS — I700 Atherosclerosis of aorta: Secondary | ICD-10-CM | POA: Diagnosis not present

## 2023-02-14 DIAGNOSIS — N183 Chronic kidney disease, stage 3 unspecified: Secondary | ICD-10-CM | POA: Diagnosis not present

## 2023-02-27 DIAGNOSIS — H43812 Vitreous degeneration, left eye: Secondary | ICD-10-CM | POA: Diagnosis not present

## 2023-02-27 DIAGNOSIS — H31001 Unspecified chorioretinal scars, right eye: Secondary | ICD-10-CM | POA: Diagnosis not present

## 2023-02-27 DIAGNOSIS — H52203 Unspecified astigmatism, bilateral: Secondary | ICD-10-CM | POA: Diagnosis not present

## 2023-03-14 DIAGNOSIS — Z125 Encounter for screening for malignant neoplasm of prostate: Secondary | ICD-10-CM | POA: Diagnosis not present

## 2023-03-14 DIAGNOSIS — I10 Essential (primary) hypertension: Secondary | ICD-10-CM | POA: Diagnosis not present

## 2023-03-17 ENCOUNTER — Other Ambulatory Visit (HOSPITAL_BASED_OUTPATIENT_CLINIC_OR_DEPARTMENT_OTHER): Payer: Self-pay

## 2023-03-17 DIAGNOSIS — N2581 Secondary hyperparathyroidism of renal origin: Secondary | ICD-10-CM | POA: Diagnosis not present

## 2023-03-17 DIAGNOSIS — K219 Gastro-esophageal reflux disease without esophagitis: Secondary | ICD-10-CM | POA: Diagnosis not present

## 2023-03-17 DIAGNOSIS — D631 Anemia in chronic kidney disease: Secondary | ICD-10-CM | POA: Diagnosis not present

## 2023-03-17 DIAGNOSIS — Z0000 Encounter for general adult medical examination without abnormal findings: Secondary | ICD-10-CM | POA: Diagnosis not present

## 2023-03-17 DIAGNOSIS — E118 Type 2 diabetes mellitus with unspecified complications: Secondary | ICD-10-CM | POA: Diagnosis not present

## 2023-03-17 DIAGNOSIS — E1142 Type 2 diabetes mellitus with diabetic polyneuropathy: Secondary | ICD-10-CM | POA: Diagnosis not present

## 2023-03-17 DIAGNOSIS — Z23 Encounter for immunization: Secondary | ICD-10-CM | POA: Diagnosis not present

## 2023-03-17 DIAGNOSIS — I10 Essential (primary) hypertension: Secondary | ICD-10-CM | POA: Diagnosis not present

## 2023-03-17 DIAGNOSIS — N1831 Chronic kidney disease, stage 3a: Secondary | ICD-10-CM | POA: Diagnosis not present

## 2023-03-17 MED ORDER — SERTRALINE HCL 50 MG PO TABS
50.0000 mg | ORAL_TABLET | Freq: Every morning | ORAL | 4 refills | Status: AC
  Filled 2023-03-17: qty 90, 90d supply, fill #0
  Filled 2023-06-21: qty 90, 90d supply, fill #1
  Filled 2023-11-12: qty 90, 90d supply, fill #2

## 2023-04-20 DIAGNOSIS — F411 Generalized anxiety disorder: Secondary | ICD-10-CM | POA: Diagnosis not present

## 2023-04-20 DIAGNOSIS — Z23 Encounter for immunization: Secondary | ICD-10-CM | POA: Diagnosis not present

## 2023-04-20 DIAGNOSIS — F339 Major depressive disorder, recurrent, unspecified: Secondary | ICD-10-CM | POA: Diagnosis not present

## 2023-05-17 DIAGNOSIS — E1165 Type 2 diabetes mellitus with hyperglycemia: Secondary | ICD-10-CM | POA: Diagnosis not present

## 2023-05-24 DIAGNOSIS — I10 Essential (primary) hypertension: Secondary | ICD-10-CM | POA: Diagnosis not present

## 2023-05-24 DIAGNOSIS — N1831 Chronic kidney disease, stage 3a: Secondary | ICD-10-CM | POA: Diagnosis not present

## 2023-05-24 DIAGNOSIS — E118 Type 2 diabetes mellitus with unspecified complications: Secondary | ICD-10-CM | POA: Diagnosis not present

## 2023-05-24 DIAGNOSIS — E1165 Type 2 diabetes mellitus with hyperglycemia: Secondary | ICD-10-CM | POA: Diagnosis not present

## 2023-06-20 ENCOUNTER — Encounter: Payer: Self-pay | Admitting: Diagnostic Neuroimaging

## 2023-06-20 ENCOUNTER — Ambulatory Visit: Payer: Medicare PPO | Admitting: Diagnostic Neuroimaging

## 2023-06-20 VITALS — BP 148/72 | HR 69 | Ht 69.0 in | Wt 186.0 lb

## 2023-06-20 DIAGNOSIS — R269 Unspecified abnormalities of gait and mobility: Secondary | ICD-10-CM | POA: Diagnosis not present

## 2023-06-20 NOTE — Patient Instructions (Signed)
GAIT DIFF, ATAXIA  - use cane / walker; refer to PT evaluation  MEMORY / COGNITIVE DIFFICULTY - MMSE 28/30; maintaining ADLs; could be normal again vs mild neurogenerative process - try to stay active physically and get some exercise (at least 15-30 minutes per day) - eat a nutritious diet with lean protein, plants / vegetables, whole grains; avoid ultra-processed foods - increase social activities, brain stimulation, games, puzzles, hobbies, crafts, arts, music; try new activities; keep it fun! - aim for at least 7-8 hours sleep per night (or more) - avoid smoking and alcohol - caution with medications, finances, driving - safety / supervision issues reviewed

## 2023-06-20 NOTE — Progress Notes (Signed)
[facility]  PATIENT: James Roberson DOB: [birth year]  REFERRING CLINICIAN: [name], MD HISTORY FROM: patient  REASON FOR VISIT: follow up   HISTORICAL  CHIEF COMPLAINT:  Chief Complaint  Patient presents with   Memory Loss    RM 7 with spouse James Roberson Pt is well, spouse reports pt tends to have a problem with sequencing such as doing something in order. He also forgets things he is suppose to do and he gets lost when driving.    HISTORY OF PRESENT ILLNESS:   UPDATE (06/20/23, VRP): Since last visit, continues with gait issues. Now with processing and navigation issues. More falls.   UPDATE (07/26/22, VRP): Since last visit, doing about the same. Still having numbness, balance issues, and falls. GI issues resolved since stopping trulicity. Other workup was negative.   PRIOR HPI (04/06/22): 77 year old male here for evaluation of gait and balance difficulty for past 2 years.  Gradual onset progressive stiff, slow, shuffling gait.  Having issues with numbness and tingling in hands and arms.  More difficulty with coordination of upper extremities.  Symptoms more on the left the right side.  Also had some progressive fatigue, constipation, visual abnormalities.  Has had multiple falls.  History of diabetes, cervical discectomy, and low back surgeries in the past.   REVIEW OF SYSTEMS: Full 14 system review of systems performed and negative with exception of: As per HPI.  ALLERGIES: Allergies  Allergen Reactions   Avelox Abc [Moxifloxacin] Anaphylaxis   Tape Rash    PAPER TAPE ONLY!!   Ambien Cr [Zolpidem Tartrate Er] Other (See Comments)     Hallucinations   Jardiance [Empagliflozin]    Mounjaro [Tirzepatide] Nausea Only    GI issues   Trulicity [Dulaglutide]     pancreatitis   Zolpidem Other (See Comments)     Hallucinations - name brand only - tolerates generic    Other Rash    Nuderm and Coban     HOME MEDICATIONS: Outpatient Medications  Prior to Visit  Medication Sig Dispense Refill   amLODipine (NORVASC) 5 MG tablet TAKE 1 TABLET (5 MG TOTAL) BY MOUTH DAILY. 90 tablet 3   aspirin (ASPIRIN 81) 81 MG chewable tablet 1 tablet     atorvastatin (LIPITOR) 80 MG tablet Take 80 mg by mouth daily.     Blood Glucose Monitoring Suppl (TRUE METRIX METER) w/Device KIT See admin instructions.     buPROPion (WELLBUTRIN XL) 150 MG 24 hr tablet Take 150 mg by mouth every morning.     gabapentin (NEURONTIN) 300 MG capsule Take 600 mg by mouth at bedtime.     Insulin Pen Needle (BD PEN NEEDLE NANO 2ND GEN) 32G X 4 MM MISC one injection per day for 90 days     lisinopril (ZESTRIL) 40 MG tablet Take 40 mg by mouth daily.     Melatonin 10 MG TABS      metFORMIN (GLUCOPHAGE-XR) 500 MG 24 hr tablet Take 1,000 mg by mouth 2 (two) times daily.     ondansetron (ZOFRAN) 4 MG tablet Take 4 mg by mouth once.     sertraline (ZOLOFT) 50 MG tablet Take 0.5 tablet by mouth daily for 4 days, THEN Take 1 tablet (50 mg total) by mouth in the morning. 90 tablet 4   TOUJEO SOLOSTAR 300 UNIT/ML Solostar Pen 6 units once daily Subcutaneous     No facility-administered medications prior to visit.    PAST MEDICAL HISTORY: Past Medical History:  Diagnosis Date  Diabetes mellitus without complication (HCC)    Hyperlipidemia    Hypertension    Intervertebral disk disease    cervical   Obesity    Psoriasis    Tremor     PAST SURGICAL HISTORY: Past Surgical History:  Procedure Laterality Date   BACK SURGERY     I & D EXTREMITY Right 07/20/2018   Procedure: IRRIGATION AND DEBRIDEMENT  OF LEG;  Surgeon: Tarry Kos, MD;  Location: [facility] OR;  Service: Orthopedics;  Laterality: Right;   LEG SURGERY     LUMBAR LAMINECTOMY  1989    FAMILY HISTORY: Family History  Problem Relation Age of Onset   CAD Father     SOCIAL HISTORY: Social History   Socioeconomic History   Marital status: Married    Spouse name: Not on file   Number of children: Not on  file   Years of education: Not on file   Highest education level: Not on file  Occupational History   Not on file  Tobacco Use   Smoking status: Former    Current packs/day: 0.00    Types: Cigarettes    Quit date: 07/23/1966    Years since quitting: 56.9   Smokeless tobacco: Never  Substance and Sexual Activity   Alcohol use: Yes    Comment: 1 a quarter   Drug use: Not on file   Sexual activity: Not on file  Other Topics Concern   Not on file  Social History Narrative   Lives with wife   Social Determinants of Health   Financial Resource Strain: Not on file  Food Insecurity: Not on file  Transportation Needs: Not on file  Physical Activity: Not on file  Stress: Not on file  Social Connections: Unknown (12/20/2021)   Received from [facility], [facility]   Social Network    Social Network: Not on file  Intimate Partner Violence: Unknown (11/11/2021)   Received from [facility], [facility]   HITS    Physically Hurt: Not on file    Insult or Talk Down To: Not on file    Threaten Physical Harm: Not on file    Scream or Curse: Not on file     PHYSICAL EXAM  GENERAL EXAM/CONSTITUTIONAL: Vitals:  Vitals:   06/20/23 1343 06/20/23 1355  BP: (!) 147/76 (!) 148/72  Pulse: 69 69  Weight: 186 lb (84.4 kg)   Height: 5\' 9"  (1.753 m)    Body mass index is 27.47 kg/m. Wt Readings from Last 3 Encounters:  06/20/23 186 lb (84.4 kg)  01/01/23 181 lb (82.1 kg)  09/22/22 185 lb 9.6 oz (84.2 kg)   Patient is in no distress; well developed, nourished and groomed; neck is supple  CARDIOVASCULAR: Examination of carotid arteries is normal; no carotid bruits Regular rate and rhythm, no murmurs Examination of peripheral vascular system by observation and palpation is normal  EYES: Ophthalmoscopic exam of optic discs and posterior segments is normal; no papilledema or hemorrhages No results found.  MUSCULOSKELETAL: Gait, strength, tone, movements noted in Neurologic  exam below  NEUROLOGIC: MENTAL STATUS:     06/20/2023    1:45 PM  MMSE - Mini Mental State Exam  Orientation to time 4  Orientation to Place 5  Registration 3  Attention/ Calculation 5  Recall 2  Language- name 2 objects 2  Language- repeat 1  Language- follow 3 step command 3  Language- read & follow direction 1  Write a sentence 1  Copy design 1  Total score 28   awake, alert, oriented to person, place and time recent and remote memory intact normal attention and concentration language fluent, comprehension intact, naming intact fund of knowledge appropriate  CRANIAL NERVE:  2nd - no papilledema on fundoscopic exam 2nd, 3rd, 4th, 6th - pupils equal and reactive to light, visual fields full to confrontation, extraocular muscles intact, no nystagmus 5th - facial sensation symmetric 7th - facial strength symmetric 8th - hearing intact 9th - palate elevates symmetrically, uvula midline 11th - shoulder shrug symmetric 12th - tongue protrusion midline  MOTOR:  normal bulk and tone, full strength in the BUE, BLE  SENSORY:  normal and symmetric to light touch, temperature, vibration  COORDINATION:  finger-nose-finger, fine finger movements normal  REFLEXES:  deep tendon reflexes TRACE and symmetric  GAIT/STATION:  narrow based GAIT; UNSTEADY; CAUTIOUS; SLIGHTLY VEERING SIDE TO SIDE; DIFF WITH TURNING; DIFFICULTY WITH TOE, HEEL AND TANDEM GAIT; ROMBERG NEGATIVE     DIAGNOSTIC DATA (LABS, IMAGING, TESTING) - I reviewed patient records, labs, notes, testing and imaging myself where available.  Lab Results  Component Value Date   WBC 6.0 01/01/2023   HGB 12.8 (L) 01/01/2023   HCT 38.9 (L) 01/01/2023   MCV 89.4 01/01/2023   PLT 340 01/01/2023      Component Value Date/Time   NA 134 (L) 01/01/2023 1609   K 5.3 (H) 01/01/2023 1609   CL 102 01/01/2023 1609   CO2 19 (L) 01/01/2023 1609   GLUCOSE 320 (H) 01/01/2023 1609   BUN 33 (H) 01/01/2023 1609    CREATININE 1.94 (H) 01/01/2023 1609   CALCIUM 10.2 01/01/2023 1609   PROT 7.8 01/01/2023 1609   PROT 6.9 03/30/2022 1334   ALBUMIN 4.6 01/01/2023 1609   ALBUMIN 4.5 03/30/2022 1334   AST 16 01/01/2023 1609   ALT 22 01/01/2023 1609   ALKPHOS 73 01/01/2023 1609   BILITOT 0.4 01/01/2023 1609   BILITOT <0.2 03/30/2022 1334   GFRNONAA 35 (L) 01/01/2023 1609   GFRAA 52 (L) 12/30/2019 1835   Lab Results  Component Value Date   CHOL 259 (H) 03/30/2022   HDL 66 03/30/2022   LDLCALC 158 (H) 03/30/2022   TRIG 193 (H) 03/30/2022   CHOLHDL 3.9 03/30/2022   Lab Results  Component Value Date   HGBA1C 8.0 (H) 11/10/2014   Lab Results  Component Value Date   VITAMINB12 1,108 04/06/2022   Lab Results  Component Value Date   TSH 2.752 Test methodology is 3rd generation TSH 10/29/2007   SCA panel - negative  Paraneoplastic panel - negative  02/26/22 MRI brain [I reviewed images myself and agree with interpretation. -VRP]  1. No specific cause for symptoms. No significant change since 2018. 2. 3 cm arachnoid cyst at the high left frontal convexity. 3. Generalized atrophy.   02/26/22 MRI cervical spine [I reviewed images myself and agree with interpretation. -VRP]  1. ACDF with solid arthrodesis from C3-C6. No recurrent impingement. 2. Facet osteoarthritis at C2-3 and C7-T1 with mild anterolisthesis. Moderate left foraminal narrowing at C7-T1.  05/06/22 MRI of the lumbar spine without contrast shows the following: At L1-L2, there is minimal spinal stenosis due to mild retrolisthesis and other degenerative change.  There is moderately severe foraminal narrowing and left greater than right lateral recess stenosis with potential for L1-L2 nerve root compression. At L2-L3, there is mild spinal stenosis due to minimal retrolisthesis and other degenerative change.  There is moderately severe left lateral recess stenosis and moderate bilateral foraminal narrowing  right lateral recess stenosis.   There is potential left L3 nerve root compression. At L3-L4, there is moderate spinal stenosis due to degenerative changes also causing moderately severe right foraminal narrowing and moderate bilateral lateral recess stenosis.  There is potential for right L3 nerve root compression. At L4-L5, there are degenerative changes causing foraminal and lateral recess stenosis but no spinal stenosis or nerve root compression. Milder degenerative changes at T12-L1 and L5-S1 do not lead to spinal stenosis or nerve root compression.    ASSESSMENT AND PLAN  77 y.o. year old male here with:   Dx:  1. Gait difficulty      PLAN:  GAIT DIFF, SENSORY ATAXIA (likely related to lumbar spinal stenosis, radiculopathies + diabetic neuropathies; other extensive workup negative; cannot rule out underlying neurogenerative process based on history and exam) - use cane / walker; refer to PT evaluation  MEMORY / COGNITIVE DIFFICULTY - MMSE 28/30; maintaining ADLs; could be normal again vs mild neurogenerative process - try to stay active physically and get some exercise (at least 15-30 minutes per day) - eat a nutritious diet with lean protein, plants / vegetables, whole grains; avoid ultra-processed foods - increase social activities, brain stimulation, games, puzzles, hobbies, crafts, arts, music; try new activities; keep it fun! - aim for at least 7-8 hours sleep per night (or more) - avoid smoking and alcohol - caution with medications, finances, driving - safety / supervision issues reviewed  Return for return to PCP, pending if symptoms worsen or fail to improve.      Suanne Marker, MD 06/20/2023, 2:26 PM Certified in Neurology, Neurophysiology and Neuroimaging  [facility] [address], [address] [place], [place] [address] [phone]

## 2023-06-21 ENCOUNTER — Other Ambulatory Visit (HOSPITAL_BASED_OUTPATIENT_CLINIC_OR_DEPARTMENT_OTHER): Payer: Self-pay

## 2023-06-21 ENCOUNTER — Encounter: Payer: Self-pay | Admitting: Diagnostic Neuroimaging

## 2023-07-26 ENCOUNTER — Ambulatory Visit (HOSPITAL_BASED_OUTPATIENT_CLINIC_OR_DEPARTMENT_OTHER): Payer: Medicare PPO | Attending: Diagnostic Neuroimaging | Admitting: Physical Therapy

## 2023-07-26 DIAGNOSIS — Z9181 History of falling: Secondary | ICD-10-CM | POA: Insufficient documentation

## 2023-07-26 DIAGNOSIS — R278 Other lack of coordination: Secondary | ICD-10-CM | POA: Diagnosis not present

## 2023-07-26 DIAGNOSIS — R2689 Other abnormalities of gait and mobility: Secondary | ICD-10-CM | POA: Insufficient documentation

## 2023-07-26 DIAGNOSIS — M6281 Muscle weakness (generalized): Secondary | ICD-10-CM | POA: Diagnosis not present

## 2023-07-26 NOTE — Therapy (Signed)
OUTPATIENT PHYSICAL THERAPY LOWER EXTREMITY EVALUATION   Patient Name: James Roberson MRN: [id number] DOB:Sep 13, 1945, 77 y.o., male Today's Date: 07/27/2023  END OF SESSION:  PT End of Session - 07/26/23 1735     Visit Number 1    Number of Visits 12    Date for PT Re-Evaluation 09/20/23    Authorization Type [employer] MCR    PT Start Time 1628    PT Stop Time 1723    PT Time Calculation (min) 55 min    Activity Tolerance Patient tolerated treatment well    Behavior During Therapy WFL for tasks assessed/performed             Past Medical History:  Diagnosis Date   Diabetes mellitus without complication (HCC)    Hyperlipidemia    Hypertension    Intervertebral disk disease    cervical   Obesity    Psoriasis    Tremor    Past Surgical History:  Procedure Laterality Date   BACK SURGERY     I & D EXTREMITY Right 07/20/2018   Procedure: IRRIGATION AND DEBRIDEMENT  OF LEG;  Surgeon: Tarry Kos, MD;  Location: [facility] OR;  Service: Orthopedics;  Laterality: Right;   LEG SURGERY     LUMBAR LAMINECTOMY  1989   Patient Active Problem List   Diagnosis Date Noted   Elevated coronary artery calcium score 06/08/2021   Leg abscess 07/22/2018   Cellulitis of right lower extremity 07/17/2018   Type 2 diabetes mellitus with obesity (HCC) 07/17/2018   CKD (chronic kidney disease) stage 3, GFR 30-59 ml/min (HCC) 11/11/2014   Cellulitis 10/30/2014   Diabetes mellitus type 2, controlled (HCC) 10/30/2014   Hyperlipidemia 10/30/2014   Renal failure (ARF), acute on chronic (HCC) 10/30/2014   Sepsis (HCC) 10/30/2014   ULCER, LEG 09/25/2008   DIZZINESS 09/25/2008   DIABETES MELLITUS, TYPE II 09/23/2008   HYPERLIPIDEMIA 09/23/2008   GOUT 09/23/2008   Essential hypertension 09/23/2008   PSORIASIS, HX OF 09/23/2008     REFERRING PROVIDER: Suanne Marker, MD   REFERRING DIAG: R26.9 (ICD-10-CM) - Gait difficulty   THERAPY DIAG:  Other abnormalities of gait and  mobility  Other lack of coordination  Hx of falling  Muscle weakness (generalized)  Rationale for Evaluation and Treatment: Rehabilitation  ONSET DATE: Sx's worsening over the past 1.5 years ; PT order 06/20/2023  SUBJECTIVE:   SUBJECTIVE STATEMENT: Sx's began 4-6 years ago.  Pt has balance difficulties and dizziness which is getting worse.  He states this has been worsening over the past 1.5 years.  He states he has been seen by multiple MD's and has not received a reason/dx.  He has been seen by neurologist and was not dx'd with any neurological disease.  Pt's falling is getting worse.   MD informed him to use an AD and ordered PT.  PT order indicated gait and balance training.  Pt has a rollator and a cane.  Pt's wife states he doesn't want to use an AD.  He is using an AD very minimally, but does use the cane if he senses his balance is off.  Pt has difficulty with turning with gait.  Pt states he loses his balance with turning head.  He has difficulty with reaching upward.  Pt averages falling once per week.   Pt is limited with standing duration.   Pt's wife present.  PERTINENT HISTORY: -Hx of falling -dizziness  -Surgery for retinal detachment, visual difficulty involving depth perception -Sensory  ataxia (likely related to lumbar spinal stenosis, radiculopathies, and diabetic neuropathies per MD note)  -Cervical pain with hx of 2 fusions and discectomy -Lumbar laminectomy in 1989.  MRI findings of mild to moderate spinal stenosis and mild/minimal retrolisthesis  -Recurring R LE wounds which he has had checked.  He has had prior leg surgery due to infections. -DM type 2, HTN, psoriasis, tremor, and chronic kidney disease -Memory/cognitive difficulty including navigation issues   PAIN:  Cervical pain:  4/10 current H/A's constant  PRECAUTIONS: Fall and Other: cervical fusions, recurring leg wound, lumbar spinal stenosis and retrolisthesis   WEIGHT BEARING RESTRICTIONS:  No  FALLS:  Has patient fallen in last 6 months? Yes. Number of falls averages once every week  LIVING ENVIRONMENT: Lives with: lives with their spouse Lives in: one level home Stairs: no Has following equipment at home: [facility], rollator  OCCUPATION: Pt is retired  PLOF: Independent  PATIENT GOALS: to stand and walk with better balance   OBJECTIVE:  Note: Objective measures were completed at Evaluation unless otherwise noted.  DIAGNOSTIC FINDINGS:  02/26/22 MRI brain  1. No specific cause for symptoms. No significant change since 2018. 2. 3 cm arachnoid cyst at the high left frontal convexity. 3. Generalized atrophy.   02/26/22 MRI cervical spine  1. ACDF with solid arthrodesis from C3-C6. No recurrent impingement. 2. Facet osteoarthritis at C2-3 and C7-T1 with mild anterolisthesis. Moderate left foraminal narrowing at C7-T1.   05/06/22 MRI of the lumbar spine without contrast shows the following: At L1-L2, there is minimal spinal stenosis due to mild retrolisthesis and other degenerative change.  There is moderately severe foraminal narrowing and left greater than right lateral recess stenosis with potential for L1-L2 nerve root compression. At L2-L3, there is mild spinal stenosis due to minimal retrolisthesis and other degenerative change.  There is moderately severe left lateral recess stenosis and moderate bilateral foraminal narrowing right lateral recess stenosis.  There is potential left L3 nerve root compression. At L3-L4, there is moderate spinal stenosis due to degenerative changes also causing moderately severe right foraminal narrowing and moderate bilateral lateral recess stenosis.  There is potential for right L3 nerve root compression. At L4-L5, there are degenerative changes causing foraminal and lateral recess stenosis but no spinal stenosis or nerve root compression. Milder degenerative changes at T12-L1 and L5-S1 do not lead to spinal stenosis or nerve root  compression.  PATIENT SURVEYS:  ABC scale 42.5%  COGNITION: Overall cognitive status: Within functional limits for tasks assessed.  Pt has some memory issues including navigation issues.     LOWER EXTREMITY ROM:  Active ROM Right eval Left eval  Hip flexion    Hip extension    Hip abduction    Hip adduction    Hip internal rotation    Hip external rotation    Knee flexion    Knee extension    Ankle dorsiflexion [facility]  Ankle plantarflexion    Ankle inversion    Ankle eversion     (Blank rows = not tested)  LOWER EXTREMITY MMT:  MMT Right eval Left eval  Hip flexion 5/5 5/5  Hip extension    Hip abduction 23.2 21.7  Hip adduction    Hip internal rotation    Hip external rotation    Knee flexion 5/5 seated  5/5 seated   Knee extension 5/5 5/5  Ankle dorsiflexion 5/5 5/5  Ankle plantarflexion WFL seated WFL seated  Ankle inversion    Ankle eversion     (  Blank rows = not tested)    FUNCTIONAL TESTS:  5x STS test:  23.37 TUG:  15.81 with SBA  Balance: Standing with FT x 1 min without LOB, but increased sway Pt unable to obtain tandem stance without UE support. Tandem stance:  R:  12 sec, L:  8-9 sec with increased sway Unable to perform SLS  GAIT: Comments: Pt ambulated with SPC with SBA and without SPC with CGA.  Pt had no LOB.  He was slow with gait and more tentative without cane.  Pt is very slow with turning and a little unsteady though no LOB.    TODAY'S TREATMENT:                                                                                                                                See below for pt education  PATIENT EDUCATION:  Education details: Objective findings, dx, rationale of treatment, POC, next treatment expectations/plan, and relevant anatomy. Person educated: Patient and Spouse Education method: Explanation Education comprehension: verbalized understanding  HOME EXERCISE PROGRAM: Will give next  visit  ASSESSMENT:  CLINICAL IMPRESSION: Patient is a 77 y.o. male with a dx of gait difficulty.  Over the past 1.5 years, his balance and dizziness have been getting worse.  Pt states he falls at least once every week.  He has been seen by multiple MD's including neurology and has not received a reason/dx.  MD informed him to use an AD.  He has a rollator and a cane though pt's wife states he doesn't want to use an AD.  He is using an AD very minimally.  Pt has difficulty with turning with gait and reaching upward.  Pt has balance deficits and is limited with standing duration.  Pt had weakness in bilat hip abd though had good strength in other mm t/o bilat LE's.  It required increased time to perform the 5x STS test indicating pt having a fall risk and decreased functional LE strength.  Pt should benefit from skilled PT services to address impairments, decrease fall risk, and improve overall function.       OBJECTIVE IMPAIRMENTS: Abnormal gait, decreased activity tolerance, decreased balance, decreased coordination, decreased endurance, decreased mobility, difficulty walking, decreased strength, dizziness, and pain.   ACTIVITY LIMITATIONS: bending, standing, squatting, transfers, reach over head, and locomotion level  PARTICIPATION LIMITATIONS: cleaning, shopping, and community activity  PERSONAL FACTORS: 3+ comorbidities: depth perception/visual difficulty, dizziness, DM type 2, Lumbar pathology, cervical fusions  are also affecting patient's functional outcome.   REHAB POTENTIAL: Good  CLINICAL DECISION MAKING: Evolving/moderate complexity  EVALUATION COMPLEXITY: Moderate   GOALS:  SHORT TERM GOALS: Target date: 08/23/2023  Pt will be independent and compliant with HEP for improved balance, strength, and mobility.  Baseline: Goal status: INITIAL  2.  Pt will report at least a 25% improvement in his balance overall.   Baseline:  Goal status: INITIAL  3.  Pt will perform  5x STS  test in < than 19 sec for improved functional LE strength and performance of transfers. Baseline:  Goal status: INITIAL  4.  Pt will be able to perform tandem stance at least 20 sec bilat for improved balance and stability.  Baseline:  Goal status: INITIAL   LONG TERM GOALS: Target date: 09/20/2023  Pt will demo improved bilat hip abd strength by at least 6-8 lbs for improved performance of functional mobility and stability with gait.  Baseline:  Goal status: INITIAL  2.  Pt will demo improved stability with turning with gait and pt and pt's wife will report significantly improved stability with turning while walking.   Baseline:  Goal status: INITIAL  3.  Pt will report at least a 70% improvement in his balance and stability with his daily mobility and at least a 70% reduction in falls.  Baseline:  Goal status: INITIAL  4.  Pt will perform Berg Balance test next visit and will make an appropriate goal as needed for increased Berg score for improved balance. Baseline:  Goal status: INITIAL  5.  Pt will report increased usage of AD and demonstrate safe usage of AD  for improved stability with daily mobility and to reduce his fall risk.  Baseline:  Goal status: INITIAL    PLAN:  PT FREQUENCY: 2x/week  PT DURATION: 6 weeks  PLANNED INTERVENTIONS: 97164- PT Re-evaluation, 97110-Therapeutic exercises, 97530- Therapeutic activity, [id number]- Neuromuscular re-education, 97535- Self Care, [id number]- Manual therapy, [id number]- Gait training, [phone]- Aquatic Therapy, 97014- Electrical stimulation (unattended), Patient/Family education, Balance training, Stair training, Taping, Dry Needling, Joint mobilization, Cryotherapy, and Moist heat  PLAN FOR NEXT SESSION: Cont with balance training, strengthening, gait, and functional mobility.  Establish HEP.  Berg Balance assessment next visit.   Referring diagnosis?   R26.9 (ICD-10-CM) - Gait difficulty   Treatment diagnosis? (if different than  referring diagnosis)  Other abnormalities of gait and mobility  Other lack of coordination  Hx of falling  Muscle weakness (generalized)  What was this (referring dx) caused by? []  Surgery [x]  Fall []  Ongoing issue []  Arthritis []  Other: ____________  Laterality: []  Rt []  Lt []  Both  Check all possible CPT codes:  *CHOOSE 10 OR LESS*    See Planned Interventions listed in the Plan section of the Evaluation.     Audie Clear III PT, DPT 07/27/23 9:39 PM

## 2023-07-27 ENCOUNTER — Other Ambulatory Visit: Payer: Self-pay

## 2023-07-27 ENCOUNTER — Encounter (HOSPITAL_BASED_OUTPATIENT_CLINIC_OR_DEPARTMENT_OTHER): Payer: Self-pay | Admitting: Physical Therapy

## 2023-08-14 ENCOUNTER — Encounter (HOSPITAL_BASED_OUTPATIENT_CLINIC_OR_DEPARTMENT_OTHER): Payer: Medicare PPO | Admitting: Physical Therapy

## 2023-08-16 ENCOUNTER — Encounter (HOSPITAL_BASED_OUTPATIENT_CLINIC_OR_DEPARTMENT_OTHER): Payer: Self-pay

## 2023-08-16 ENCOUNTER — Ambulatory Visit (HOSPITAL_BASED_OUTPATIENT_CLINIC_OR_DEPARTMENT_OTHER): Payer: Medicare PPO | Attending: Diagnostic Neuroimaging

## 2023-08-16 DIAGNOSIS — Z9181 History of falling: Secondary | ICD-10-CM | POA: Diagnosis not present

## 2023-08-16 DIAGNOSIS — M6281 Muscle weakness (generalized): Secondary | ICD-10-CM | POA: Diagnosis not present

## 2023-08-16 DIAGNOSIS — R2689 Other abnormalities of gait and mobility: Secondary | ICD-10-CM | POA: Insufficient documentation

## 2023-08-16 DIAGNOSIS — R278 Other lack of coordination: Secondary | ICD-10-CM | POA: Insufficient documentation

## 2023-08-16 NOTE — Therapy (Signed)
 OUTPATIENT PHYSICAL THERAPY LOWER EXTREMITY TREATMENT  Patient Name: James Roberson MRN: [id number] DOB:23-Sep-1945, 78 y.o., male Today's Date: 08/16/2023  END OF SESSION:  PT End of Session - 08/16/23 1656     Visit Number 2    Number of Visits 12    Date for PT Re-Evaluation 09/20/23    Authorization Type [employer] M[employer]    PT Start Time 1605    PT Stop Time 1648    PT Time Calculation (min) 43 min    Equipment Utilized During Treatment Gait belt    Activity Tolerance Patient tolerated treatment well    Behavior During Therapy WFL for tasks assessed/performed              Past Medical History:  Diagnosis Date   Diabetes mellitus without complication (HCC)    Hyperlipidemia    Hypertension    Intervertebral disk disease    cervical   Obesity    Psoriasis    Tremor    Past Surgical History:  Procedure Laterality Date   BACK SURGERY     I & D EXTREMITY Right 07/20/2018   Procedure: IRRIGATION AND DEBRIDEMENT  OF LEG;  Surgeon: Jerri Kay HERO, MD;  Location: [facility] OR;  Service: Orthopedics;  Laterality: Right;   LEG SURGERY     LUMBAR LAMINECTOMY  1989   Patient Active Problem List   Diagnosis Date Noted   Elevated coronary artery calcium  score 06/08/2021   Leg abscess 07/22/2018   Cellulitis of right lower extremity 07/17/2018   Type 2 diabetes mellitus with obesity (HCC) 07/17/2018   CKD (chronic kidney disease) stage 3, GFR 30-59 ml/min (HCC) 11/11/2014   Cellulitis 10/30/2014   Diabetes mellitus type 2, controlled (HCC) 10/30/2014   Hyperlipidemia 10/30/2014   Renal failure (ARF), acute on chronic (HCC) 10/30/2014   Sepsis (HCC) 10/30/2014   ULCER, LEG 09/25/2008   DIZZINESS 09/25/2008   DIABETES MELLITUS, TYPE II 09/23/2008   HYPERLIPIDEMIA 09/23/2008   GOUT 09/23/2008   Essential hypertension 09/23/2008   PSORIASIS, HX OF 09/23/2008     REFERRING PROVIDER: Margaret Eduard SAUNDERS, MD   REFERRING DIAG: R26.9 (ICD-10-CM) - Gait difficulty   THERAPY DIAG:   Other abnormalities of gait and mobility  Other lack of coordination  Hx of falling  Muscle weakness (generalized)  Rationale for Evaluation and Treatment: Rehabilitation  ONSET DATE: Sx's worsening over the past 1.5 years ; PT order 06/20/2023  SUBJECTIVE:   SUBJECTIVE STATEMENT:  Pt reports he has not fallen in the last 2 weeks. He related this to being more sedentary. Arrives without AD. Pt reports his dizziness is on/off.    Sx's began 4-6 years ago.  Pt has balance difficulties and dizziness which is getting worse.  He states this has been worsening over the past 1.5 years.  He states he has been seen by multiple MD's and has not received a reason/dx.  He has been seen by neurologist and was not dx'd with any neurological disease.  Pt's falling is getting worse.   MD informed him to use an AD and ordered PT.  PT order indicated gait and balance training.  Pt has a rollator and a cane.  Pt's wife states he doesn't want to use an AD.  He is using an AD very minimally, but does use the cane if he senses his balance is off.  Pt has difficulty with turning with gait.  Pt states he loses his balance with turning head.  He has difficulty with  reaching upward.  Pt averages falling once per week.   Pt is limited with standing duration.   Pt's wife present.  PERTINENT HISTORY: -Hx of falling -dizziness  -Surgery for retinal detachment, visual difficulty involving depth perception -Sensory ataxia (likely related to lumbar spinal stenosis, radiculopathies, and diabetic neuropathies per MD note)  -Cervical pain with hx of 2 fusions and discectomy -Lumbar laminectomy in 1989.  MRI findings of mild to moderate spinal stenosis and mild/minimal retrolisthesis  -Recurring R LE wounds which he has had checked.  He has had prior leg surgery due to infections. -DM type 2, HTN, psoriasis, tremor, and chronic kidney disease -Memory/cognitive difficulty including navigation issues   PAIN:   Cervical pain:  4/10 current H/A's constant  PRECAUTIONS: Fall and Other: cervical fusions, recurring leg wound, lumbar spinal stenosis and retrolisthesis   WEIGHT BEARING RESTRICTIONS: No  FALLS:  Has patient fallen in last 6 months? Yes. Number of falls averages once every week  LIVING ENVIRONMENT: Lives with: lives with their spouse Lives in: one level home Stairs: no Has following equipment at home: [facility], rollator  OCCUPATION: Pt is retired  PLOF: Independent  PATIENT GOALS: to stand and walk with better balance   OBJECTIVE:  Note: Objective measures were completed at Evaluation unless otherwise noted.  DIAGNOSTIC FINDINGS:  02/26/22 MRI brain  1. No specific cause for symptoms. No significant change since 2018. 2. 3 cm arachnoid cyst at the high left frontal convexity. 3. Generalized atrophy.   02/26/22 MRI cervical spine  1. ACDF with solid arthrodesis from C3-C6. No recurrent impingement. 2. Facet osteoarthritis at C2-3 and C7-T1 with mild anterolisthesis. Moderate left foraminal narrowing at C7-T1.   05/06/22 MRI of the lumbar spine without contrast shows the following: At L1-L2, there is minimal spinal stenosis due to mild retrolisthesis and other degenerative change.  There is moderately severe foraminal narrowing and left greater than right lateral recess stenosis with potential for L1-L2 nerve root compression. At L2-L3, there is mild spinal stenosis due to minimal retrolisthesis and other degenerative change.  There is moderately severe left lateral recess stenosis and moderate bilateral foraminal narrowing right lateral recess stenosis.  There is potential left L3 nerve root compression. At L3-L4, there is moderate spinal stenosis due to degenerative changes also causing moderately severe right foraminal narrowing and moderate bilateral lateral recess stenosis.  There is potential for right L3 nerve root compression. At L4-L5, there are degenerative changes  causing foraminal and lateral recess stenosis but no spinal stenosis or nerve root compression. Milder degenerative changes at T12-L1 and L5-S1 do not lead to spinal stenosis or nerve root compression.  PATIENT SURVEYS:  ABC scale 42.5%  COGNITION: Overall cognitive status: Within functional limits for tasks assessed.  Pt has some memory issues including navigation issues.     LOWER EXTREMITY ROM:  Active ROM Right eval Left eval  Hip flexion    Hip extension    Hip abduction    Hip adduction    Hip internal rotation    Hip external rotation    Knee flexion    Knee extension    Ankle dorsiflexion [facility]  Ankle plantarflexion    Ankle inversion    Ankle eversion     (Blank rows = not tested)  LOWER EXTREMITY MMT:  MMT Right eval Left eval  Hip flexion 5/5 5/5  Hip extension    Hip abduction 23.2 21.7  Hip adduction    Hip internal rotation    Hip external  rotation    Knee flexion 5/5 seated  5/5 seated   Knee extension 5/5 5/5  Ankle dorsiflexion 5/5 5/5  Ankle plantarflexion WFL seated WFL seated  Ankle inversion    Ankle eversion     (Blank rows = not tested)    FUNCTIONAL TESTS:  5x STS test:  23.37 TUG:  15.81 with SBA  Balance: Standing with FT x 1 min without LOB, but increased sway Pt unable to obtain tandem stance without UE support. Tandem stance:  R:  12 sec, L:  8-9 sec with increased sway Unable to perform SLS  GAIT: Comments: Pt ambulated with SPC with SBA and without SPC with CGA.  Pt had no LOB.  He was slow with gait and more tentative without cane.  Pt is very slow with turning and a little unsteady though no LOB.    TODAY'S TREATMENT:                                                                                                                                08/16/23  -Standing marching no UES with CGA 2x10 -Romberg with head turns/nods 30seconds -Romberg stance Staggered stance balance -Standing HR 2x10 -     PATIENT  EDUCATION:  Education details: Objective findings, dx, rationale of treatment, POC, next treatment expectations/plan, and relevant anatomy. Person educated: Patient and Spouse Education method: Explanation Education comprehension: verbalized understanding  HOME EXERCISE PROGRAM: Access Code: [email or web address] URL: [email or web address] Date: 08/16/2023 Prepared by: Asberry Rodes  Exercises - Seated Long Arc Quad  - 1 x daily - 7 x weekly - 2 sets - 10 reps - Seated March  - 1 x daily - 7 x weekly - 2 sets - 10 reps - Sit to Stand Without Arm Support  - 1 x daily - 7 x weekly - 2 sets - 3-5 reps - Narrow Stance with Counter Support  - 1 x daily - 7 x weekly - 2 sets - 20seconds hold - Semi-Tandem Balance at [employer] Eyes Open  - 1 x daily - 7 x weekly - 2 sets - 20seconds hold  ASSESSMENT:  CLINICAL IMPRESSION:  Spent time educating pt regarding use of AD and importance of fall prevention. He has rollator at home, but has not used it.  Advised patient use this to prevent additional falls and injuries.  Patient demonstrates significant weakness throughout bilateral lower extremities as well as core with exercises.  Patient will benefit from additional strengthening as well as neuro re-ed for both static and dynamic balance to reduce risk of falls.  Gait belt was utilized throughout session and CGA was required with most tasks.  Eval: Patient is a 78 y.o. male with a dx of gait difficulty.  Over the past 1.5 years, his balance and dizziness have been getting worse.  Pt states he falls at least once every week.  He has been seen by multiple MD's including neurology and has not  received a reason/dx.  MD informed him to use an AD.  He has a rollator and a cane though pt's wife states he doesn't want to use an AD.  He is using an AD very minimally.  Pt has difficulty with turning with gait and reaching upward.  Pt has balance deficits and is limited with standing duration.  Pt had weakness  in bilat hip abd though had good strength in other mm t/o bilat LE's.  It required increased time to perform the 5x STS test indicating pt having a fall risk and decreased functional LE strength.  Pt should benefit from skilled PT services to address impairments, decrease fall risk, and improve overall function.       OBJECTIVE IMPAIRMENTS: Abnormal gait, decreased activity tolerance, decreased balance, decreased coordination, decreased endurance, decreased mobility, difficulty walking, decreased strength, dizziness, and pain.   ACTIVITY LIMITATIONS: bending, standing, squatting, transfers, reach over head, and locomotion level  PARTICIPATION LIMITATIONS: cleaning, shopping, and community activity  PERSONAL FACTORS: 3+ comorbidities: depth perception/visual difficulty, dizziness, DM type 2, Lumbar pathology, cervical fusions  are also affecting patient's functional outcome.   REHAB POTENTIAL: Good  CLINICAL DECISION MAKING: Evolving/moderate complexity  EVALUATION COMPLEXITY: Moderate   GOALS:  SHORT TERM GOALS: Target date: 08/23/2023  Pt will be independent and compliant with HEP for improved balance, strength, and mobility.  Baseline: Goal status: INITIAL  2.  Pt will report at least a 25% improvement in his balance overall.   Baseline:  Goal status: INITIAL  3.  Pt will perform 5x STS test in < than 19 sec for improved functional LE strength and performance of transfers. Baseline:  Goal status: INITIAL  4.  Pt will be able to perform tandem stance at least 20 sec bilat for improved balance and stability.  Baseline:  Goal status: INITIAL   LONG TERM GOALS: Target date: 09/20/2023  Pt will demo improved bilat hip abd strength by at least 6-8 lbs for improved performance of functional mobility and stability with gait.  Baseline:  Goal status: INITIAL  2.  Pt will demo improved stability with turning with gait and pt and pt's wife will report significantly improved stability  with turning while walking.   Baseline:  Goal status: INITIAL  3.  Pt will report at least a 70% improvement in his balance and stability with his daily mobility and at least a 70% reduction in falls.  Baseline:  Goal status: INITIAL  4.  Pt will perform Berg Balance test next visit and will make an appropriate goal as needed for increased Berg score for improved balance. Baseline:  Goal status: INITIAL  5.  Pt will report increased usage of AD and demonstrate safe usage of AD  for improved stability with daily mobility and to reduce his fall risk.  Baseline:  Goal status: INITIAL    PLAN:  PT FREQUENCY: 2x/week  PT DURATION: 6 weeks  PLANNED INTERVENTIONS: 97164- PT Re-evaluation, 97110-Therapeutic exercises, 97530- Therapeutic activity, [id number]- Neuromuscular re-education, 97535- Self Care, [id number]- Manual therapy, [id number]- Gait training, [phone]- Aquatic Therapy, 97014- Electrical stimulation (unattended), Patient/Family education, Balance training, Stair training, Taping, Dry Needling, Joint mobilization, Cryotherapy, and Moist heat  PLAN FOR NEXT SESSION: Cont with balance training, strengthening, gait, and functional mobility.  Establish HEP.  Berg Balance assessment next visit.   Referring diagnosis?   R26.9 (ICD-10-CM) - Gait difficulty   Treatment diagnosis? (if different than referring diagnosis)  Other abnormalities of gait and mobility  Other lack of coordination  Hx of falling  Muscle weakness (generalized)  What was this (referring dx) caused by? []  Surgery [x]  Fall []  Ongoing issue []  Arthritis []  Other: ____________  Laterality: []  Rt []  Lt []  Both  Check all possible CPT codes:  *CHOOSE 10 OR LESS*    See Planned Interventions listed in the Plan section of the Evaluation.     Asberry Rodes, PTA  08/16/23 5:05 PM

## 2023-08-25 ENCOUNTER — Encounter (HOSPITAL_BASED_OUTPATIENT_CLINIC_OR_DEPARTMENT_OTHER): Payer: Self-pay

## 2023-08-25 ENCOUNTER — Ambulatory Visit (HOSPITAL_BASED_OUTPATIENT_CLINIC_OR_DEPARTMENT_OTHER): Payer: Medicare PPO

## 2023-08-25 DIAGNOSIS — Z9181 History of falling: Secondary | ICD-10-CM | POA: Diagnosis not present

## 2023-08-25 DIAGNOSIS — R2689 Other abnormalities of gait and mobility: Secondary | ICD-10-CM

## 2023-08-25 DIAGNOSIS — R278 Other lack of coordination: Secondary | ICD-10-CM

## 2023-08-25 DIAGNOSIS — M6281 Muscle weakness (generalized): Secondary | ICD-10-CM

## 2023-08-25 NOTE — Therapy (Signed)
OUTPATIENT PHYSICAL THERAPY LOWER EXTREMITY TREATMENT  Patient Name: James Roberson MRN: [id number] DOB:1946-04-01, 78 y.o., male Today's Date: 08/25/2023  END OF SESSION:  PT End of Session - 08/25/23 1522     Visit Number 3    Number of Visits 12    Date for PT Re-Evaluation 09/20/23    Authorization Type [employer] MCR    PT Start Time 1349    PT Stop Time 1430    PT Time Calculation (min) 41 min    Equipment Utilized During Treatment Gait belt    Activity Tolerance Patient tolerated treatment well    Behavior During Therapy WFL for tasks assessed/performed               Past Medical History:  Diagnosis Date   Diabetes mellitus without complication (HCC)    Hyperlipidemia    Hypertension    Intervertebral disk disease    cervical   Obesity    Psoriasis    Tremor    Past Surgical History:  Procedure Laterality Date   BACK SURGERY     I & D EXTREMITY Right 07/20/2018   Procedure: IRRIGATION AND DEBRIDEMENT  OF LEG;  Surgeon: Tarry Kos, MD;  Location: [facility] OR;  Service: Orthopedics;  Laterality: Right;   LEG SURGERY     LUMBAR LAMINECTOMY  1989   Patient Active Problem List   Diagnosis Date Noted   Elevated coronary artery calcium score 06/08/2021   Leg abscess 07/22/2018   Cellulitis of right lower extremity 07/17/2018   Type 2 diabetes mellitus with obesity (HCC) 07/17/2018   CKD (chronic kidney disease) stage 3, GFR 30-59 ml/min (HCC) 11/11/2014   Cellulitis 10/30/2014   Diabetes mellitus type 2, controlled (HCC) 10/30/2014   Hyperlipidemia 10/30/2014   Renal failure (ARF), acute on chronic (HCC) 10/30/2014   Sepsis (HCC) 10/30/2014   ULCER, LEG 09/25/2008   DIZZINESS 09/25/2008   DIABETES MELLITUS, TYPE II 09/23/2008   HYPERLIPIDEMIA 09/23/2008   GOUT 09/23/2008   Essential hypertension 09/23/2008   PSORIASIS, HX OF 09/23/2008     REFERRING PROVIDER: Suanne Marker, MD   REFERRING DIAG: R26.9 (ICD-10-CM) - Gait difficulty   THERAPY  DIAG:  Muscle weakness (generalized)  Hx of falling  Other lack of coordination  Other abnormalities of gait and mobility  Rationale for Evaluation and Treatment: Rehabilitation  ONSET DATE: Sx's worsening over the past 1.5 years ; PT order 06/20/2023  SUBJECTIVE:   SUBJECTIVE STATEMENT: Pt reports no falls since last visit. Had about 3 "stumbles".  "When I lose my balance, my skull feels like it's moving back and forth and I feel dizzy like the room is spinning." Dizziness can linger up to . "The trigger seems to be losing my balance. The quick movement of needing catch myself seems to bring it on."  Pt did report increased dizziness and decreased balance after last PT session. "It started after I got home and got out of the car. It didn't improve until mid day the next day."   Sx's began 4-6 years ago.  Pt has balance difficulties and dizziness which is getting worse.  He states this has been worsening over the past 1.5 years.  He states he has been seen by multiple MD's and has not received a reason/dx.  He has been seen by neurologist and was not dx'd with any neurological disease.  Pt's falling is getting worse.   MD informed him to use an AD and ordered PT.  PT order  indicated gait and balance training.  Pt has a rollator and a cane.  Pt's wife states he doesn't want to use an AD.  He is using an AD very minimally, but does use the cane if he senses his balance is off.  Pt has difficulty with turning with gait.  Pt states he loses his balance with turning head.  He has difficulty with reaching upward.  Pt averages falling once per week.   Pt is limited with standing duration.   Pt's wife present.  PERTINENT HISTORY: -Hx of falling -dizziness  -Surgery for retinal detachment, visual difficulty involving depth perception -Sensory ataxia (likely related to lumbar spinal stenosis, radiculopathies, and diabetic neuropathies per MD note)  -Cervical pain with hx of 2 fusions and  discectomy -Lumbar laminectomy in 1989.  MRI findings of mild to moderate spinal stenosis and mild/minimal retrolisthesis  -Recurring R LE wounds which he has had checked.  He has had prior leg surgery due to infections. -DM type 2, HTN, psoriasis, tremor, and chronic kidney disease -Memory/cognitive difficulty including navigation issues   PAIN:  Cervical pain:  4/10 current H/A's constant  PRECAUTIONS: Fall and Other: cervical fusions, recurring leg wound, lumbar spinal stenosis and retrolisthesis   WEIGHT BEARING RESTRICTIONS: No  FALLS:  Has patient fallen in last 6 months? Yes. Number of falls averages once every week  LIVING ENVIRONMENT: Lives with: lives with their spouse Lives in: one level home Stairs: no Has following equipment at home: [facility], rollator  OCCUPATION: Pt is retired  PLOF: Independent  PATIENT GOALS: to stand and walk with better balance   OBJECTIVE:  Note: Objective measures were completed at Evaluation unless otherwise noted.  DIAGNOSTIC FINDINGS:  02/26/22 MRI brain  1. No specific cause for symptoms. No significant change since 2018. 2. 3 cm arachnoid cyst at the high left frontal convexity. 3. Generalized atrophy.   02/26/22 MRI cervical spine  1. ACDF with solid arthrodesis from C3-C6. No recurrent impingement. 2. Facet osteoarthritis at C2-3 and C7-T1 with mild anterolisthesis. Moderate left foraminal narrowing at C7-T1.   05/06/22 MRI of the lumbar spine without contrast shows the following: At L1-L2, there is minimal spinal stenosis due to mild retrolisthesis and other degenerative change.  There is moderately severe foraminal narrowing and left greater than right lateral recess stenosis with potential for L1-L2 nerve root compression. At L2-L3, there is mild spinal stenosis due to minimal retrolisthesis and other degenerative change.  There is moderately severe left lateral recess stenosis and moderate bilateral foraminal narrowing right  lateral recess stenosis.  There is potential left L3 nerve root compression. At L3-L4, there is moderate spinal stenosis due to degenerative changes also causing moderately severe right foraminal narrowing and moderate bilateral lateral recess stenosis.  There is potential for right L3 nerve root compression. At L4-L5, there are degenerative changes causing foraminal and lateral recess stenosis but no spinal stenosis or nerve root compression. Milder degenerative changes at T12-L1 and L5-S1 do not lead to spinal stenosis or nerve root compression.  PATIENT SURVEYS:  ABC scale 42.5%  COGNITION: Overall cognitive status: Within functional limits for tasks assessed.  Pt has some memory issues including navigation issues.     LOWER EXTREMITY ROM:  Active ROM Right eval Left eval  Hip flexion    Hip extension    Hip abduction    Hip adduction    Hip internal rotation    Hip external rotation    Knee flexion    Knee extension  Ankle dorsiflexion [facility]  Ankle plantarflexion    Ankle inversion    Ankle eversion     (Blank rows = not tested)  LOWER EXTREMITY MMT:  MMT Right eval Left eval  Hip flexion 5/5 5/5  Hip extension    Hip abduction 23.2 21.7  Hip adduction    Hip internal rotation    Hip external rotation    Knee flexion 5/5 seated  5/5 seated   Knee extension 5/5 5/5  Ankle dorsiflexion 5/5 5/5  Ankle plantarflexion WFL seated WFL seated  Ankle inversion    Ankle eversion     (Blank rows = not tested)    FUNCTIONAL TESTS:  5x STS test:  23.37 TUG:  15.81 with SBA  Balance: Standing with FT x 1 min without LOB, but increased sway Pt unable to obtain tandem stance without UE support. Tandem stance:  R:  12 sec, L:  8-9 sec with increased sway Unable to perform SLS  GAIT: Comments: Pt ambulated with SPC with SBA and without SPC with CGA.  Pt had no LOB.  He was slow with gait and more tentative without cane.  Pt is very slow with turning and a little  unsteady though no LOB.    TODAY'S TREATMENT:                                                                                                                                 1/17: HR/TR x20 Romberg with head turns/nods x40min each- light CGA/SBA Standing marching without UE support with light CGA and then only SBA Staggered tandem stance 30sec ea Standing on airex-feet apart EC x30sec Standing on airex feet apart cross body reaching x10ea, then added trunk rotation x10ea Nu step L5 x52min UE and LE BP: 151/84 and 87bpm.    08/16/23  -Standing marching no UES with CGA 2x10 -Romberg with head turns/nods 30seconds -Romberg stance Staggered stance balance -Standing HR 2x10 -     PATIENT EDUCATION:  Education details: Objective findings, dx, rationale of treatment, POC, next treatment expectations/plan, and relevant anatomy. Person educated: Patient and Spouse Education method: Explanation Education comprehension: verbalized understanding  HOME EXERCISE PROGRAM: Access Code: [email or web address] URL: [email or web address] Date: 08/16/2023 Prepared by: Riki Altes  Exercises - Seated Long Arc Quad  - 1 x daily - 7 x weekly - 2 sets - 10 reps - Seated March  - 1 x daily - 7 x weekly - 2 sets - 10 reps - Sit to Stand Without Arm Support  - 1 x daily - 7 x weekly - 2 sets - 3-5 reps - Narrow Stance with Counter Support  - 1 x daily - 7 x weekly - 2 sets - 20seconds hold - Semi-Tandem Balance at [employer] Eyes Open  - 1 x daily - 7 x weekly - 2 sets - 20seconds hold  ASSESSMENT:  CLINICAL IMPRESSION:  Pt experienced onset of dizziness after first few balance  exercises. This continued throughout session and pt reports lightheadedness with dizziness which is typical for him. Measured BP today at 151/84 and 87bpm. He did report a decrease in dizziness by end of session. Instructed pt to continue to monitor frequency and occurrence of dizzy episodes. Again emphasized  importance of using AD.  Will discuss potential vestibular referral with evaluating PT.   Eval: Patient is a 78 y.o. male with a dx of gait difficulty.  Over the past 1.5 years, his balance and dizziness have been getting worse.  Pt states he falls at least once every week.  He has been seen by multiple MD's including neurology and has not received a reason/dx.  MD informed him to use an AD.  He has a rollator and a cane though pt's wife states he doesn't want to use an AD.  He is using an AD very minimally.  Pt has difficulty with turning with gait and reaching upward.  Pt has balance deficits and is limited with standing duration.  Pt had weakness in bilat hip abd though had good strength in other mm t/o bilat LE's.  It required increased time to perform the 5x STS test indicating pt having a fall risk and decreased functional LE strength.  Pt should benefit from skilled PT services to address impairments, decrease fall risk, and improve overall function.       OBJECTIVE IMPAIRMENTS: Abnormal gait, decreased activity tolerance, decreased balance, decreased coordination, decreased endurance, decreased mobility, difficulty walking, decreased strength, dizziness, and pain.   ACTIVITY LIMITATIONS: bending, standing, squatting, transfers, reach over head, and locomotion level  PARTICIPATION LIMITATIONS: cleaning, shopping, and community activity  PERSONAL FACTORS: 3+ comorbidities: depth perception/visual difficulty, dizziness, DM type 2, Lumbar pathology, cervical fusions  are also affecting patient's functional outcome.   REHAB POTENTIAL: Good  CLINICAL DECISION MAKING: Evolving/moderate complexity  EVALUATION COMPLEXITY: Moderate   GOALS:  SHORT TERM GOALS: Target date: 08/23/2023  Pt will be independent and compliant with HEP for improved balance, strength, and mobility.  Baseline: Goal status: INITIAL  2.  Pt will report at least a 25% improvement in his balance overall.   Baseline:   Goal status: INITIAL  3.  Pt will perform 5x STS test in < than 19 sec for improved functional LE strength and performance of transfers. Baseline:  Goal status: INITIAL  4.  Pt will be able to perform tandem stance at least 20 sec bilat for improved balance and stability.  Baseline:  Goal status: INITIAL   LONG TERM GOALS: Target date: 09/20/2023  Pt will demo improved bilat hip abd strength by at least 6-8 lbs for improved performance of functional mobility and stability with gait.  Baseline:  Goal status: INITIAL  2.  Pt will demo improved stability with turning with gait and pt and pt's wife will report significantly improved stability with turning while walking.   Baseline:  Goal status: INITIAL  3.  Pt will report at least a 70% improvement in his balance and stability with his daily mobility and at least a 70% reduction in falls.  Baseline:  Goal status: INITIAL  4.  Pt will perform Berg Balance test next visit and will make an appropriate goal as needed for increased Berg score for improved balance. Baseline:  Goal status: INITIAL  5.  Pt will report increased usage of AD and demonstrate safe usage of AD  for improved stability with daily mobility and to reduce his fall risk.  Baseline:  Goal status: INITIAL  PLAN:  PT FREQUENCY: 2x/week  PT DURATION: 6 weeks  PLANNED INTERVENTIONS: 97164- PT Re-evaluation, 97110-Therapeutic exercises, 97530- Therapeutic activity, [id number]- Neuromuscular re-education, 97535- Self Care, [id number]- Manual therapy, [phone]- Gait training, [phone]- Aquatic Therapy, 97014- Electrical stimulation (unattended), Patient/Family education, Balance training, Stair training, Taping, Dry Needling, Joint mobilization, Cryotherapy, and Moist heat  PLAN FOR NEXT SESSION: Cont with balance training, strengthening, gait, and functional mobility.  Establish HEP.  Berg Balance assessment next visit.   Referring diagnosis?   R26.9 (ICD-10-CM) - Gait difficulty    Treatment diagnosis? (if different than referring diagnosis)  Other abnormalities of gait and mobility  Other lack of coordination  Hx of falling  Muscle weakness (generalized)  What was this (referring dx) caused by? []  Surgery [x]  Fall []  Ongoing issue []  Arthritis []  Other: ____________  Laterality: []  Rt []  Lt []  Both  Check all possible CPT codes:  *CHOOSE 10 OR LESS*    See Planned Interventions listed in the Plan section of the Evaluation.     Riki Altes, PTA  08/25/23 3:25 PM

## 2023-08-29 ENCOUNTER — Ambulatory Visit (HOSPITAL_BASED_OUTPATIENT_CLINIC_OR_DEPARTMENT_OTHER): Payer: Medicare PPO | Admitting: Physical Therapy

## 2023-08-29 ENCOUNTER — Encounter (HOSPITAL_BASED_OUTPATIENT_CLINIC_OR_DEPARTMENT_OTHER): Payer: Self-pay | Admitting: Physical Therapy

## 2023-08-29 DIAGNOSIS — Z9181 History of falling: Secondary | ICD-10-CM

## 2023-08-29 DIAGNOSIS — M6281 Muscle weakness (generalized): Secondary | ICD-10-CM | POA: Diagnosis not present

## 2023-08-29 DIAGNOSIS — R2689 Other abnormalities of gait and mobility: Secondary | ICD-10-CM | POA: Diagnosis not present

## 2023-08-29 DIAGNOSIS — R278 Other lack of coordination: Secondary | ICD-10-CM

## 2023-08-29 NOTE — Therapy (Addendum)
 OUTPATIENT PHYSICAL THERAPY LOWER EXTREMITY TREATMENT  Patient Name: James Roberson MRN: [id number] DOB:[birth year], 78 y.o., male Today's Date: 08/30/2023  END OF SESSION:  PT End of Session - 08/29/23 1619     Visit Number 4    Number of Visits 12    Date for PT Re-Evaluation 09/20/23    Authorization Type [employer] MCR    PT Start Time 1618    PT Stop Time 1700    PT Time Calculation (min) 42 min    Activity Tolerance Patient tolerated treatment well    Behavior During Therapy WFL for tasks assessed/performed               Past Medical History:  Diagnosis Date   Diabetes mellitus without complication (HCC)    Hyperlipidemia    Hypertension    Intervertebral disk disease    cervical   Obesity    Psoriasis    Tremor    Past Surgical History:  Procedure Laterality Date   BACK SURGERY     I & D EXTREMITY Right 07/20/2018   Procedure: IRRIGATION AND DEBRIDEMENT  OF LEG;  Surgeon: Tarry Kos, MD;  Location: [facility] OR;  Service: Orthopedics;  Laterality: Right;   LEG SURGERY     LUMBAR LAMINECTOMY  1989   Patient Active Problem List   Diagnosis Date Noted   Elevated coronary artery calcium score 06/08/2021   Leg abscess 07/22/2018   Cellulitis of right lower extremity 07/17/2018   Type 2 diabetes mellitus with obesity (HCC) 07/17/2018   CKD (chronic kidney disease) stage 3, GFR 30-59 ml/min (HCC) 11/11/2014   Cellulitis 10/30/2014   Diabetes mellitus type 2, controlled (HCC) 10/30/2014   Hyperlipidemia 10/30/2014   Renal failure (ARF), acute on chronic (HCC) 10/30/2014   Sepsis (HCC) 10/30/2014   ULCER, LEG 09/25/2008   DIZZINESS 09/25/2008   DIABETES MELLITUS, TYPE II 09/23/2008   HYPERLIPIDEMIA 09/23/2008   GOUT 09/23/2008   Essential hypertension 09/23/2008   PSORIASIS, HX OF 09/23/2008     REFERRING PROVIDER: Suanne Marker, MD   REFERRING DIAG: R26.9 (ICD-10-CM) - Gait difficulty   THERAPY DIAG:  Other abnormalities of gait and  mobility  Other lack of coordination  Hx of falling  Muscle weakness (generalized)  Rationale for Evaluation and Treatment: Rehabilitation  ONSET DATE: Sx's worsening over the past 1.5 years ; PT order 06/20/2023  SUBJECTIVE:   SUBJECTIVE STATEMENT: Pt states he feels uncomfortable and is worn out after PT treatment.  Pt continues to have dizziness.  He has balance and dizziness issues and is not sure which comes first.  Pt states he had a balance issue when getting OOB yesterday AM.  He stumbled, but did not fall.  He states the balance and dizziness affected him for the rest of the day.  This morning he had no issues when getting OOB.  Pt has been trying to figure out a cause, but cannot discover a trigger.  Pt has had no falls since beginning of PT, but has stumbles.  Pt reports having difficulty with shopping [employer] due to looking up and head turns.  He has to take his time with shopping.    PERTINENT HISTORY: -Hx of falling -dizziness  -Surgery for retinal detachment, visual difficulty involving depth perception -Sensory ataxia (likely related to lumbar spinal stenosis, radiculopathies, and diabetic neuropathies per MD note)  -Cervical pain with hx of 2 fusions and discectomy -Lumbar laminectomy in 1989.  MRI findings of mild to moderate spinal stenosis  and mild/minimal retrolisthesis  -Recurring R LE wounds which he has had checked.  He has had prior leg surgery due to infections. -DM type 2, HTN, psoriasis, tremor, and chronic kidney disease -Memory/cognitive difficulty including navigation issues   PAIN:  6/10 in L sided lumbar.  Pt took acetaminophen this AM.   PRECAUTIONS: Fall and Other: cervical fusions, recurring leg wound, lumbar spinal stenosis and retrolisthesis   WEIGHT BEARING RESTRICTIONS: No  FALLS:  Has patient fallen in last 6 months? Yes. Number of falls averages once every week  LIVING ENVIRONMENT: Lives with: lives with their spouse Lives in: one  level home Stairs: no Has following equipment at home: [facility], rollator  OCCUPATION: Pt is retired  PLOF: Independent  PATIENT GOALS: to stand and walk with better balance   OBJECTIVE:  Note: Objective measures were completed at Evaluation unless otherwise noted.  DIAGNOSTIC FINDINGS:  02/26/22 MRI brain  1. No specific cause for symptoms. No significant change since 2018. 2. 3 cm arachnoid cyst at the high left frontal convexity. 3. Generalized atrophy.   02/26/22 MRI cervical spine  1. ACDF with solid arthrodesis from C3-C6. No recurrent impingement. 2. Facet osteoarthritis at C2-3 and C7-T1 with mild anterolisthesis. Moderate left foraminal narrowing at C7-T1.   05/06/22 MRI of the lumbar spine without contrast shows the following: At L1-L2, there is minimal spinal stenosis due to mild retrolisthesis and other degenerative change.  There is moderately severe foraminal narrowing and left greater than right lateral recess stenosis with potential for L1-L2 nerve root compression. At L2-L3, there is mild spinal stenosis due to minimal retrolisthesis and other degenerative change.  There is moderately severe left lateral recess stenosis and moderate bilateral foraminal narrowing right lateral recess stenosis.  There is potential left L3 nerve root compression. At L3-L4, there is moderate spinal stenosis due to degenerative changes also causing moderately severe right foraminal narrowing and moderate bilateral lateral recess stenosis.  There is potential for right L3 nerve root compression. At L4-L5, there are degenerative changes causing foraminal and lateral recess stenosis but no spinal stenosis or nerve root compression. Milder degenerative changes at T12-L1 and L5-S1 do not lead to spinal stenosis or nerve root compression.  PATIENT SURVEYS:  ABC scale 42.5%  COGNITION: Overall cognitive status: Within functional limits for tasks assessed.  Pt has some memory issues including  navigation issues.     LOWER EXTREMITY ROM:  Active ROM Right eval Left eval  Hip flexion    Hip extension    Hip abduction    Hip adduction    Hip internal rotation    Hip external rotation    Knee flexion    Knee extension    Ankle dorsiflexion [facility]  Ankle plantarflexion    Ankle inversion    Ankle eversion     (Blank rows = not tested)  LOWER EXTREMITY MMT:  MMT Right eval Left eval  Hip flexion 5/5 5/5  Hip extension    Hip abduction 23.2 21.7  Hip adduction    Hip internal rotation    Hip external rotation    Knee flexion 5/5 seated  5/5 seated   Knee extension 5/5 5/5  Ankle dorsiflexion 5/5 5/5  Ankle plantarflexion WFL seated WFL seated  Ankle inversion    Ankle eversion     (Blank rows = not tested)    FUNCTIONAL TESTS:  5x STS test:  23.37 TUG:  15.81 with SBA  Balance: Standing with FT x 1 min without LOB,  but increased sway Pt unable to obtain tandem stance without UE support. Tandem stance:  R:  12 sec, L:  8-9 sec with increased sway Unable to perform SLS  GAIT: Comments: Pt ambulated with SPC with SBA and without SPC with CGA.  Pt had no LOB.  He was slow with gait and more tentative without cane.  Pt is very slow with turning and a little unsteady though no LOB.    TODAY'S TREATMENT:                                                                                                                              1/21 Sit to stands without UE support x 10 reps, x 5 reps with SBA on 2nd set HR/TR x 10 Staggered stance 2x25-30 sec bilat Standing on Airex:  FA, FT x 1 min each with SBA/CGA Marching on airex with 1-2 UE support 2x10 with CGA Sidestepping without UE support with CGA x 2 laps at rail Romberg standing with head turns x 40 sec with CGA Step ups with UE support on 4 inch step x 10 bilat with CGA Alt toe taps on step with occasional UE support with CGA 2x10   1/17: HR/TR x20 Romberg with head turns/nods x63min each- light  CGA/SBA Standing marching without UE support with light CGA and then only SBA Staggered tandem stance 30sec ea Standing on airex-feet apart EC x30sec Standing on airex feet apart cross body reaching x10ea, then added trunk rotation x10ea Nu step L5 x53min UE and LE BP: 151/84 and 87bpm.    08/16/23  -Standing marching no UES with CGA 2x10 -Romberg with head turns/nods 30seconds -Romberg stance Staggered stance balance -Standing HR 2x10 -     PATIENT EDUCATION:  Education details:  dx, rationale of treatment, POC, and relevant anatomy.  Role of vestibular PT Person educated: Patient and Spouse Education method: Explanation Education comprehension: verbalized understanding  HOME EXERCISE PROGRAM: Access Code: [email or web address] URL: [email or web address] Date: 08/16/2023 Prepared by: Riki Altes  Exercises - Seated Long Arc Quad  - 1 x daily - 7 x weekly - 2 sets - 10 reps - Seated March  - 1 x daily - 7 x weekly - 2 sets - 10 reps - Sit to Stand Without Arm Support  - 1 x daily - 7 x weekly - 2 sets - 3-5 reps - Narrow Stance with Counter Support  - 1 x daily - 7 x weekly - 2 sets - 20seconds hold - Semi-Tandem Balance at [employer] Eyes Open  - 1 x daily - 7 x weekly - 2 sets - 20seconds hold  ASSESSMENT:  CLINICAL IMPRESSION:  Pt continues to c/o of dizziness and balance issues and can not determine a trigger.  Pt has had no falls since beginning PT, but has stumbles.  Pt has difficulty with shopping due to sx's when looking up and head turns.  Pt states he typically has increased dizziness after  PT sessions.  He had dizziness during treatment including with sit to stands and especially with head turns.  Pt had worse dizziness with standing with head turns.  PT had pt take seated rest breaks t/o Rx to decrease sx's.  He requires UE support on rail with 4 inch step ups.  Pt reports having worse dizziness worse after Rx.  PT spoke to pt about seeing vestibular PT's at  [facility] clinic site due to his amount of dizziness.  PT educated pt concerning vestibular PT.       OBJECTIVE IMPAIRMENTS: Abnormal gait, decreased activity tolerance, decreased balance, decreased coordination, decreased endurance, decreased mobility, difficulty walking, decreased strength, dizziness, and pain.   ACTIVITY LIMITATIONS: bending, standing, squatting, transfers, reach over head, and locomotion level  PARTICIPATION LIMITATIONS: cleaning, shopping, and community activity  PERSONAL FACTORS: 3+ comorbidities: depth perception/visual difficulty, dizziness, DM type 2, Lumbar pathology, cervical fusions  are also affecting patient's functional outcome.   REHAB POTENTIAL: Good  CLINICAL DECISION MAKING: Evolving/moderate complexity  EVALUATION COMPLEXITY: Moderate   GOALS:  SHORT TERM GOALS: Target date: 08/23/2023  Pt will be independent and compliant with HEP for improved balance, strength, and mobility.  Baseline: Goal status: INITIAL  2.  Pt will report at least a 25% improvement in his balance overall.   Baseline:  Goal status: INITIAL  3.  Pt will perform 5x STS test in < than 19 sec for improved functional LE strength and performance of transfers. Baseline:  Goal status: INITIAL  4.  Pt will be able to perform tandem stance at least 20 sec bilat for improved balance and stability.  Baseline:  Goal status: INITIAL   LONG TERM GOALS: Target date: 09/20/2023  Pt will demo improved bilat hip abd strength by at least 6-8 lbs for improved performance of functional mobility and stability with gait.  Baseline:  Goal status: INITIAL  2.  Pt will demo improved stability with turning with gait and pt and pt's wife will report significantly improved stability with turning while walking.   Baseline:  Goal status: INITIAL  3.  Pt will report at least a 70% improvement in his balance and stability with his daily mobility and at least a 70% reduction in falls.   Baseline:  Goal status: INITIAL  4.  Pt will perform Berg Balance test next visit and will make an appropriate goal as needed for increased Berg score for improved balance. Baseline:  Goal status: INITIAL  5.  Pt will report increased usage of AD and demonstrate safe usage of AD  for improved stability with daily mobility and to reduce his fall risk.  Baseline:  Goal status: INITIAL    PLAN:  PT FREQUENCY: 2x/week  PT DURATION: 6 weeks  PLANNED INTERVENTIONS: 97164- PT Re-evaluation, 97110-Therapeutic exercises, 97530- Therapeutic activity, [id number]- Neuromuscular re-education, 97535- Self Care, 16109- Manual therapy, [id number]- Gait training, [phone]- Aquatic Therapy, 97014- Electrical stimulation (unattended), Patient/Family education, Balance training, Stair training, Taping, Dry Needling, Joint mobilization, Cryotherapy, and Moist heat  PLAN FOR NEXT SESSION: Cont with balance training, strengthening, gait, and functional mobility.  Establish HEP.  Berg Balance assessment next visit.   Audie Clear III PT, DPT 08/30/23 4:21 PM  PHYSICAL THERAPY DISCHARGE SUMMARY  Visits from Start of Care: 4  Current functional level related to goals / functional outcomes: Pt continued to c/o of dizziness and balance issues.  He had dizziness during treatment and states he typically has increased dizziness after PT sessions.  PT spoke to pt about  vestibular PT.  Unable to assess goals due to not pt not being present at discharge.       Remaining deficits:    Education / Equipment: See above   Patient  came into [facility] and spoke with PT.  He stated he was doing great now.  Pt states his sx's were from a medication.  When he stopped taking the medication, he stopped having dizziness and balance issues.  Pt is planning to see MD and discuss it with his MD.  Pt will be discharged from PT at this time.  Pt is agreeable with discharge.    Audie Clear III PT, DPT 11/17/23 9:51 PM

## 2023-09-05 ENCOUNTER — Ambulatory Visit (HOSPITAL_BASED_OUTPATIENT_CLINIC_OR_DEPARTMENT_OTHER): Payer: Medicare PPO | Admitting: Physical Therapy

## 2023-09-07 ENCOUNTER — Encounter (HOSPITAL_BASED_OUTPATIENT_CLINIC_OR_DEPARTMENT_OTHER): Payer: Medicare PPO | Admitting: Physical Therapy

## 2023-09-25 DIAGNOSIS — E559 Vitamin D deficiency, unspecified: Secondary | ICD-10-CM | POA: Diagnosis not present

## 2023-10-04 ENCOUNTER — Other Ambulatory Visit: Payer: Self-pay | Admitting: Cardiovascular Disease

## 2023-11-13 ENCOUNTER — Other Ambulatory Visit (HOSPITAL_BASED_OUTPATIENT_CLINIC_OR_DEPARTMENT_OTHER): Payer: Self-pay

## 2023-11-14 ENCOUNTER — Other Ambulatory Visit (HOSPITAL_BASED_OUTPATIENT_CLINIC_OR_DEPARTMENT_OTHER): Payer: Self-pay

## 2023-11-14 DIAGNOSIS — E1165 Type 2 diabetes mellitus with hyperglycemia: Secondary | ICD-10-CM | POA: Diagnosis not present

## 2023-11-14 DIAGNOSIS — E118 Type 2 diabetes mellitus with unspecified complications: Secondary | ICD-10-CM | POA: Diagnosis not present

## 2023-11-14 DIAGNOSIS — I10 Essential (primary) hypertension: Secondary | ICD-10-CM | POA: Diagnosis not present

## 2023-11-14 MED ORDER — TOUJEO SOLOSTAR 300 UNIT/ML ~~LOC~~ SOPN
30.0000 [IU] | PEN_INJECTOR | Freq: Every day | SUBCUTANEOUS | 3 refills | Status: AC
  Filled 2023-11-14: qty 9, 90d supply, fill #0

## 2023-11-14 MED ORDER — NOVOLOG FLEXPEN 100 UNIT/ML ~~LOC~~ SOPN
PEN_INJECTOR | SUBCUTANEOUS | 3 refills | Status: AC
  Filled 2023-11-14: qty 9, 30d supply, fill #0

## 2023-11-15 ENCOUNTER — Other Ambulatory Visit (HOSPITAL_BASED_OUTPATIENT_CLINIC_OR_DEPARTMENT_OTHER): Payer: Self-pay

## 2023-11-28 DIAGNOSIS — I10 Essential (primary) hypertension: Secondary | ICD-10-CM | POA: Diagnosis not present

## 2023-11-28 DIAGNOSIS — E118 Type 2 diabetes mellitus with unspecified complications: Secondary | ICD-10-CM | POA: Diagnosis not present

## 2023-11-28 DIAGNOSIS — E1165 Type 2 diabetes mellitus with hyperglycemia: Secondary | ICD-10-CM | POA: Diagnosis not present

## 2023-11-28 DIAGNOSIS — I2510 Atherosclerotic heart disease of native coronary artery without angina pectoris: Secondary | ICD-10-CM | POA: Diagnosis not present

## 2023-11-28 DIAGNOSIS — E782 Mixed hyperlipidemia: Secondary | ICD-10-CM | POA: Diagnosis not present

## 2023-11-29 NOTE — Telephone Encounter (Signed)
 This encounter was created in error - please disregard.

## 2023-12-14 ENCOUNTER — Other Ambulatory Visit: Payer: Self-pay

## 2023-12-14 ENCOUNTER — Ambulatory Visit
Admission: EM | Admit: 2023-12-14 | Discharge: 2023-12-14 | Disposition: A | Discharge status: Other Acute Inpatient Hospital | Attending: Family Medicine | Admitting: Family Medicine

## 2023-12-14 ENCOUNTER — Ambulatory Visit (INDEPENDENT_AMBULATORY_CARE_PROVIDER_SITE_OTHER)

## 2023-12-14 ENCOUNTER — Encounter (HOSPITAL_BASED_OUTPATIENT_CLINIC_OR_DEPARTMENT_OTHER): Payer: Self-pay | Admitting: Emergency Medicine

## 2023-12-14 ENCOUNTER — Emergency Department (HOSPITAL_BASED_OUTPATIENT_CLINIC_OR_DEPARTMENT_OTHER)

## 2023-12-14 ENCOUNTER — Emergency Department (HOSPITAL_BASED_OUTPATIENT_CLINIC_OR_DEPARTMENT_OTHER)
Admission: EM | Admit: 2023-12-14 | Discharge: 2023-12-14 | Disposition: A | Discharge status: Home / Self Care | Attending: Emergency Medicine | Admitting: Emergency Medicine

## 2023-12-14 DIAGNOSIS — Z7984 Long term (current) use of oral hypoglycemic drugs: Secondary | ICD-10-CM | POA: Insufficient documentation

## 2023-12-14 DIAGNOSIS — M79662 Pain in left lower leg: Secondary | ICD-10-CM

## 2023-12-14 DIAGNOSIS — E1122 Type 2 diabetes mellitus with diabetic chronic kidney disease: Secondary | ICD-10-CM | POA: Insufficient documentation

## 2023-12-14 DIAGNOSIS — R600 Localized edema: Secondary | ICD-10-CM | POA: Diagnosis not present

## 2023-12-14 DIAGNOSIS — N189 Chronic kidney disease, unspecified: Secondary | ICD-10-CM | POA: Insufficient documentation

## 2023-12-14 DIAGNOSIS — Z79899 Other long term (current) drug therapy: Secondary | ICD-10-CM | POA: Diagnosis not present

## 2023-12-14 DIAGNOSIS — S82839A Other fracture of upper and lower end of unspecified fibula, initial encounter for closed fracture: Secondary | ICD-10-CM | POA: Diagnosis not present

## 2023-12-14 DIAGNOSIS — S82402A Unspecified fracture of shaft of left fibula, initial encounter for closed fracture: Secondary | ICD-10-CM | POA: Diagnosis not present

## 2023-12-14 DIAGNOSIS — I129 Hypertensive chronic kidney disease with stage 1 through stage 4 chronic kidney disease, or unspecified chronic kidney disease: Secondary | ICD-10-CM | POA: Diagnosis not present

## 2023-12-14 DIAGNOSIS — M79605 Pain in left leg: Secondary | ICD-10-CM | POA: Diagnosis not present

## 2023-12-14 DIAGNOSIS — M25562 Pain in left knee: Secondary | ICD-10-CM | POA: Diagnosis not present

## 2023-12-14 DIAGNOSIS — Z7982 Long term (current) use of aspirin: Secondary | ICD-10-CM | POA: Diagnosis not present

## 2023-12-14 DIAGNOSIS — Z794 Long term (current) use of insulin: Secondary | ICD-10-CM | POA: Insufficient documentation

## 2023-12-14 DIAGNOSIS — M7989 Other specified soft tissue disorders: Secondary | ICD-10-CM | POA: Diagnosis not present

## 2023-12-14 NOTE — Discharge Instructions (Addendum)
Please go to the ER to rule out a blood clot in your leg

## 2023-12-14 NOTE — ED Notes (Signed)
 Patient is being discharged from the Urgent Care and sent to the Emergency Department via POV . Per Ramonita Burow, NP, patient is in need of higher level of care due to needing higher level of care. Patient is aware and verbalizes understanding of plan of care.  Vitals:   12/14/23 1556  BP: (!) 186/79  Pulse: 79  Resp: 16  Temp: 98.9 F (37.2 C)  SpO2: 95%

## 2023-12-14 NOTE — ED Provider Notes (Signed)
 [facility]    CSN: [id number] Arrival date & time: 12/14/23  1459      History   Chief Complaint Chief Complaint  Patient presents with   Fall    HPI James Roberson is a 78 y.o. male with a past medical history of diabetes with neuropathy, hypertension, hyperlipidemia, chronic kidney disease who presents for knee and lower leg pain after fall.  Patient reports a week ago while getting out of the car at a gas station he fell directly onto both knees.  States his left knee since then has been causing him more pain.  He reports he had a lot of bruising that extended down the front of his lower leg.  States the pain was initially at the knee but now is migrated down to his calf and anterior lower leg.  He does endorse swelling bilaterally which he states is normal for him but does believe the left is starting to worsen.  Denies chest pain, shortness of breath, orthopnea.  Reports history of some type of heart issue but chart review does not show history of CHF.  No history of DVT.  He is able to bear weight with pain.  Patient is worried about a blood clot in his leg.  No other injuries or concerns at this time.   Fall    Past Medical History:  Diagnosis Date   Diabetes mellitus without complication (HCC)    Hyperlipidemia    Hypertension    Intervertebral disk disease    cervical   Obesity    Psoriasis    Tremor     Patient Active Problem List   Diagnosis Date Noted   Elevated coronary artery calcium  score 06/08/2021   Leg abscess 07/22/2018   Cellulitis of right lower extremity 07/17/2018   Type 2 diabetes mellitus with obesity (HCC) 07/17/2018   CKD (chronic kidney disease) stage 3, GFR 30-59 ml/min (HCC) 11/11/2014   Cellulitis 10/30/2014   Diabetes mellitus type 2, controlled (HCC) 10/30/2014   Hyperlipidemia 10/30/2014   Renal failure (ARF), acute on chronic (HCC) 10/30/2014   Sepsis (HCC) 10/30/2014   ULCER, LEG 09/25/2008   DIZZINESS 09/25/2008    DIABETES MELLITUS, TYPE II 09/23/2008   HYPERLIPIDEMIA 09/23/2008   GOUT 09/23/2008   Essential hypertension 09/23/2008   PSORIASIS, HX OF 09/23/2008    Past Surgical History:  Procedure Laterality Date   BACK SURGERY     I & D EXTREMITY Right 07/20/2018   Procedure: IRRIGATION AND DEBRIDEMENT  OF LEG;  Surgeon: Wes Hamman, MD;  Location: [facility] OR;  Service: Orthopedics;  Laterality: Right;   LEG SURGERY     LUMBAR LAMINECTOMY  1989       Home Medications    Prior to Admission medications   Medication Sig Start Date End Date Taking? Authorizing Provider  amLODipine  (NORVASC ) 5 MG tablet TAKE 1 TABLET EVERY DAY 10/05/23   Avanell Leigh, MD  aspirin (ASPIRIN 81) 81 MG chewable tablet 1 tablet 04/06/21   [provider]  atorvastatin  (LIPITOR) 80 MG tablet Take 80 mg by mouth daily.    [provider]  Blood Glucose Monitoring Suppl (TRUE METRIX METER) w/Device KIT See admin instructions.    [provider]  buPROPion  (WELLBUTRIN  XL) 150 MG 24 hr tablet Take 150 mg by mouth every morning.    [provider]  gabapentin  (NEURONTIN ) 300 MG capsule Take 600 mg by mouth at bedtime.    [provider]  insulin   aspart (NOVOLOG  FLEXPEN) 100 UNIT/ML FlexPen 2-10 units 3 times per day on sliding scale Subcutaneous 30 days 11/14/23     insulin  glargine, 1 Unit Dial , (TOUJEO  SOLOSTAR) 300 UNIT/ML Solostar Pen Inject 30 Units into the skin daily. 11/14/23     Insulin  Pen Needle (BD PEN NEEDLE NANO 2ND GEN) 32G X 4 MM MISC one injection per day for 90 days 01/31/23   [provider]  lisinopril  (ZESTRIL ) 40 MG tablet Take 40 mg by mouth daily.    [provider]  metFORMIN (GLUCOPHAGE-XR) 500 MG 24 hr tablet Take 1,000 mg by mouth 2 (two) times daily.    [provider]  sertraline  (ZOLOFT ) 50 MG tablet Take 0.5 tablet by mouth daily for 4 days, THEN Take 1 tablet (50 mg total) by mouth in the morning. 03/17/23       Family  History Family History  Problem Relation Age of Onset   CAD Father     Social History Social History   Tobacco Use   Smoking status: Former    Current packs/day: 0.00    Types: Cigarettes    Quit date: 07/23/1966    Years since quitting: 57.4   Smokeless tobacco: Never  Vaping Use   Vaping status: Never Used  Substance Use Topics   Alcohol use: Not Currently    Comment: 1 a quarter   Drug use: Never     Allergies   Avelox abc [moxifloxacin], Tape, Ambien  cr [zolpidem  tartrate er], Jardiance [empagliflozin], Mounjaro [tirzepatide], Trulicity [dulaglutide], Zolpidem , and Other   Review of Systems Review of Systems  Musculoskeletal:        Left knee and lower leg pain     Physical Exam Triage Vital Signs ED Triage Vitals  Encounter Vitals Group     BP 12/14/23 1556 (!) 186/79     Systolic BP Percentile --      Diastolic BP Percentile --      Pulse Rate 12/14/23 1556 79     Resp 12/14/23 1556 16     Temp 12/14/23 1556 98.9 F (37.2 C)     Temp Source 12/14/23 1556 Oral     SpO2 12/14/23 1556 95 %     Weight 12/14/23 1557 190 lb (86.2 kg)     Height 12/14/23 1557 5' 8.5" (1.74 m)     Head Circumference --      Peak Flow --      Pain Score 12/14/23 1557 6     Pain Loc --      Pain Education --      Exclude from Growth Chart --    No data found.  Updated Vital Signs BP (!) 186/79 (BP Location: Left Arm)   Pulse 79   Temp 98.9 F (37.2 C) (Oral)   Resp 16   Ht 5' 8.5" (1.74 m)   Wt 190 lb (86.2 kg)   SpO2 95%   BMI 28.47 kg/m   Visual Acuity Right Eye Distance:   Left Eye Distance:   Bilateral Distance:    Right Eye Near:   Left Eye Near:    Bilateral Near:     Physical Exam Vitals and nursing note reviewed.  Constitutional:      General: He is not in acute distress.    Appearance: Normal appearance. He is not ill-appearing.  HENT:     Head: Normocephalic and atraumatic.  Eyes:     Pupils: Pupils are equal, round, and reactive to light.   Cardiovascular:  Rate and Rhythm: Normal rate.  Pulmonary:     Effort: Pulmonary effort is normal.  Musculoskeletal:     Left knee: Swelling present. No deformity, effusion, erythema, ecchymosis or lacerations. Normal range of motion. Tenderness present over the patellar tendon. No medial joint line or lateral joint line tenderness. Normal patellar mobility.     Left lower leg: Tenderness present. 2+ Pitting Edema present.     Left ankle: No ecchymosis or lacerations. No tenderness. Normal range of motion.     Comments: There is +2 pitting edema bilateral lower extremities slightly worse on left.  There is tenderness to palpation starting at the patellar tendon that extends down the anterior as well as posterior lower leg.  Significant tenderness along the calf.  There is no redness, warmth.  Left calf measures 40.25 cm, right measures 39 cm  Skin:    General: Skin is warm and dry.  Neurological:     General: No focal deficit present.     Mental Status: He is alert and oriented to person, place, and time.  Psychiatric:        Mood and Affect: Mood normal.        Behavior: Behavior normal.      UC Treatments / Results  Labs (all labs ordered are listed, but only abnormal results are displayed) Labs Reviewed - No data to display  EKG   Radiology DG Tibia/Fibula Left Result Date: 12/14/2023 CLINICAL DATA:  Lower leg pain after falling 1 week ago. EXAM: LEFT TIBIA AND FIBULA - 2 VIEW COMPARISON:  None Available. FINDINGS: The bones appear adequately mineralized. No definite acute fracture or dislocation. There are tiny ossific densities adjacent to the fibular tip which are nonspecific and potentially small avulsion fractures. There is distal soft tissue swelling which appears greatest laterally. Scattered vascular calcifications are noted. No significant joint space narrowing at the knee or ankle. IMPRESSION: Possible tiny avulsion fractures from the distal fibula. Correlate with  point tenderness. No other evidence of acute fracture or dislocation. Electronically Signed   By: Elmon Hagedorn M.D.   On: 12/14/2023 16:36    Procedures Procedures (including critical care time)  Medications Ordered in UC Medications - No data to display  Initial Impression / Assessment and Plan / UC Course  I have reviewed the triage vital signs and the nursing notes.  Pertinent labs & imaging results that were available during my care of the patient were reviewed by me and considered in my medical decision making (see chart for details).     I reviewed exam and symptoms with patient.  X-ray shows possible small avulsion of distal fibula.  Patient has no tenderness to the area.  His pain is primarily posterior calf and anterior lower leg.  He is concerned about a DVT.  Given that he does have an injury with his new onset calf pain I feel it is reasonable for him to go without a DVT.  He will go to the ER to have this done.  He declined a walking boot, fiberglass splint for his ankle.  I did discuss with him following up with orthopedics as well as his PCP in 1 week.  Patient verbalized understanding these instructions. Final Clinical Impressions(s) / UC Diagnoses   Final diagnoses:  Acute pain of left knee  Pain in left lower leg  Avulsion fracture of distal fibula     Discharge Instructions      Please go to the ER to rule out a blood  clot in your leg   ED Prescriptions   None    PDMP not reviewed this encounter.   Alleen Arbour, NP 12/14/23 [phone]

## 2023-12-14 NOTE — Discharge Instructions (Signed)
 You were seen for left leg pain. We determined that you do not have a DVT in the left leg. Your left leg xray from urgent care did show tiny fractures in the distal fibula. We recommend calling Dr. Joen Muskrat office (he specializes in foot and ankle injuries) tomorrow to set up an appointment for further evaluation. Given your bilateral leg swelling we recommend following up with your primary care provider to help determine if you are in need of diuretics, a change in blood pressure medications, or further workup to help determine why you are retaining more fluids.   Should you experience sudden chest pain, shortness of breath, fever, or worsening pain in the leg that prevents you from walking please seek medical assistance as you may be experiencing a medical emergency.

## 2023-12-14 NOTE — ED Provider Notes (Signed)
 [place] [facility]MENT AT [facility] Provider Note   CSN: [id number] Arrival date & time: 12/14/23  1717     History  Chief Complaint  Patient presents with   Leg Pain    James Roberson is a 78 y.o. male.  [name] is a 60 M with a past medical history of HTN, HLD, T2DM with neuropathy, palmoplantar pustulosis, and CKD who is here for left leg pain after a fall. He is accompanied by his wife. Patient reports falling about a week ago, he was sitting in his car and rolled out while attempting to get out. Landed on both of his knees. Experienced bilateral knee pain initially but that improved over time. Now pain seems to be mostly behind the left calf. Worsens with movement. Tylenol  provided some relief. Has a history of bilateral extremity edema. Not on diuretics. No previous history of surgery on left lower extremity, DVT, or PE. Denies chest pain, shortness of breath, or palpitations. He was seen at an urgent care earlier today and they recommended that he come to the ED for further evaluation due to concerns of lower extremity swelling.    Leg Pain      Home Medications Prior to Admission medications   Medication Sig Start Date End Date Taking? Authorizing Provider  amLODipine  (NORVASC ) 5 MG tablet TAKE 1 TABLET EVERY DAY 10/05/23   Avanell Leigh, MD  aspirin (ASPIRIN 81) 81 MG chewable tablet 1 tablet 04/06/21   [provider]  atorvastatin  (LIPITOR) 80 MG tablet Take 80 mg by mouth daily.    [provider]  Blood Glucose Monitoring Suppl (TRUE METRIX METER) w/Device KIT See admin instructions.    [provider]  buPROPion  (WELLBUTRIN  XL) 150 MG 24 hr tablet Take 150 mg by mouth every morning.    [provider]  gabapentin  (NEURONTIN ) 300 MG capsule Take 600 mg by mouth at bedtime.    [provider]  insulin  aspart (NOVOLOG  FLEXPEN) 100 UNIT/ML FlexPen 2-10 units 3 times per day on sliding scale  Subcutaneous 30 days 11/14/23     insulin  glargine, 1 Unit Dial , (TOUJEO  SOLOSTAR) 300 UNIT/ML Solostar Pen Inject 30 Units into the skin daily. 11/14/23     Insulin  Pen Needle (BD PEN NEEDLE NANO 2ND GEN) 32G X 4 MM MISC one injection per day for 90 days 01/31/23   [provider]  lisinopril  (ZESTRIL ) 40 MG tablet Take 40 mg by mouth daily.    [provider]  metFORMIN (GLUCOPHAGE-XR) 500 MG 24 hr tablet Take 1,000 mg by mouth 2 (two) times daily.    [provider]  sertraline  (ZOLOFT ) 50 MG tablet Take 0.5 tablet by mouth daily for 4 days, THEN Take 1 tablet (50 mg total) by mouth in the morning. 03/17/23         Allergies    Avelox abc [moxifloxacin], Tape, Ambien  cr [zolpidem  tartrate er], Jardiance [empagliflozin], Mounjaro [tirzepatide], Trulicity [dulaglutide], Zolpidem , and Other    Review of Systems   Review of Systems  Physical Exam Updated Vital Signs BP (!) 167/84 (BP Location: Left Arm)   Pulse 76   Temp 97.7 F (36.5 C) (Oral)   Resp 16   SpO2 100%  Physical Exam HENT:     Head: Normocephalic and atraumatic.  Pulmonary:     Effort: Pulmonary effort is normal.  Abdominal:     General: Bowel sounds are normal.     Palpations: Abdomen is soft.  Musculoskeletal:  General: Normal range of motion.     Comments: Patient presents with bilateral lower extremity 2+ pitting edema from dorsal surface of the foot to below the knee, left side does seem slightly more swollen than right. Both  lower extremities tender to palpation. Pedal and popliteal pulses bilaterally intact. Sensation intact throughout left lower extremity. No obvious bruising or deformity. Known palmoplantar pustulosis on lower extremities without surrounding erythema, warmth, or discharge. Left knee does not appear erythematous or tender.  Skin:    General: Skin is warm.  Neurological:     General: No focal deficit present.     Mental Status: He is alert.  Psychiatric:         Mood and Affect: Mood normal.     ED Results / Procedures / Treatments   Labs (all labs ordered are listed, but only abnormal results are displayed) Labs Reviewed - No data to display  EKG None  Radiology US  Venous Img Lower  Left (DVT Study) Result Date: 12/14/2023 CLINICAL DATA:  Left lower extremity pain, swelling EXAM: LEFT LOWER EXTREMITY VENOUS DOPPLER ULTRASOUND TECHNIQUE: Gray-scale sonography with compression, as well as color and duplex ultrasound, were performed to evaluate the deep venous system(s) from the level of the common femoral vein through the popliteal and proximal calf veins. COMPARISON:  None Available. FINDINGS: VENOUS Normal compressibility of the common femoral, superficial femoral, and popliteal veins, as well as the visualized calf veins. Visualized portions of profunda femoral vein and great saphenous vein unremarkable. No filling defects to suggest DVT on grayscale or color Doppler imaging. Doppler waveforms show normal direction of venous flow, normal respiratory plasticity and response to augmentation. Limited views of the contralateral common femoral vein are unremarkable. OTHER Probable left knee joint effusion. Limitations: none IMPRESSION: No evidence of left lower extremity DVT. Electronically Signed   By: Janeece Mechanic M.D.   On: 12/14/2023 18:47   DG Tibia/Fibula Left Result Date: 12/14/2023 CLINICAL DATA:  Lower leg pain after falling 1 week ago. EXAM: LEFT TIBIA AND FIBULA - 2 VIEW COMPARISON:  None Available. FINDINGS: The bones appear adequately mineralized. No definite acute fracture or dislocation. There are tiny ossific densities adjacent to the fibular tip which are nonspecific and potentially small avulsion fractures. There is distal soft tissue swelling which appears greatest laterally. Scattered vascular calcifications are noted. No significant joint space narrowing at the knee or ankle. IMPRESSION: Possible tiny avulsion fractures from the distal  fibula. Correlate with point tenderness. No other evidence of acute fracture or dislocation. Electronically Signed   By: Elmon Hagedorn M.D.   On: 12/14/2023 16:36    Procedures Procedures    Medications Ordered in ED Medications - No data to display  ED Course/ Medical Decision Making/ A&P Clinical Course as of 12/14/23 1936  Thu Dec 14, 2023  1913 He is sent in from urgent care for evaluation of left calf pain.  It has been going on about a week and a half.  He did have some trauma when he fell and struck his knees bilaterally.  He had x-rays done at urgent care that show possible distal fibular fracture.  He does not seem to have any pain there.  He is good good distal pulses.  He is get some chronic skin condition that he feels is at his baseline and is not worried is infected.  Ultrasound does not show any DVT or Baker's cyst.  Recommended symptomatic treatment and outpatient follow-up with PCP and Ortho. [MB]  Clinical Course User Index [MB] Tonya Fredrickson, MD                                 Medical Decision Making Patient does not appear in acute distress, is afebrile and saturating 100% on room air. Patient declined additional pain medication while here as pain improves with rest. Differential for left lower extremity pain includes DVT, baker's cyst, knee effusion/septic joint, cellulitis, fracture, dislocation, and claudication. Xray of left lower extremity obtained at urgent care earlier today showed possible tiny avulsion fractures from the distal fibula. Lower extremity doppler completed here was negative for DVT. Physical exam not concerning for baker's cyst, joint effusion, cellulitis, or claudication. Discussed need to follow up orthopedic surgeon (Dr. Cherl Corner) for further evaluation of fractures observed in the distal fibula. Also recommended need to follow up with PCP for worsening lower extremity swelling as it may require further outpatient workup and medication  management. Will recommend continued Tylenol  therapy, rest, ice, compression, and elevation for now. Patient is agreeable to discharge home, return precautions provided.          Final Clinical Impression(s) / ED Diagnoses Final diagnoses:  Left leg pain    Rx / DC Orders ED Discharge Orders     None         Arellano Zameza, Richar Dunklee, MD 12/14/23 1936    Tonya Fredrickson, MD 12/15/23 1029

## 2023-12-14 NOTE — ED Triage Notes (Signed)
 Pt had trip and fall 1 week  ago onto bilateral knees.  Pain has gradually improved but his left lower leg is very sore.  Seen at [facility] and sent to R/O blood clot.

## 2023-12-14 NOTE — ED Triage Notes (Signed)
 Patient here today with c/o left lower leg pain X 1 week from falling onto his knees. Patient states that he was getting out of his car to pump gas and fell onto his knees.

## 2023-12-15 ENCOUNTER — Other Ambulatory Visit: Payer: Self-pay

## 2023-12-15 ENCOUNTER — Encounter (HOSPITAL_COMMUNITY): Payer: Self-pay

## 2023-12-15 ENCOUNTER — Emergency Department (HOSPITAL_COMMUNITY)
Admission: EM | Admit: 2023-12-15 | Discharge: 2023-12-16 | Disposition: A | Discharge status: Home / Self Care | Attending: Emergency Medicine | Admitting: Emergency Medicine

## 2023-12-15 DIAGNOSIS — W19XXXA Unspecified fall, initial encounter: Secondary | ICD-10-CM | POA: Diagnosis not present

## 2023-12-15 DIAGNOSIS — Z7982 Long term (current) use of aspirin: Secondary | ICD-10-CM | POA: Insufficient documentation

## 2023-12-15 DIAGNOSIS — M79662 Pain in left lower leg: Secondary | ICD-10-CM | POA: Diagnosis not present

## 2023-12-15 DIAGNOSIS — I10 Essential (primary) hypertension: Secondary | ICD-10-CM | POA: Insufficient documentation

## 2023-12-15 DIAGNOSIS — M79605 Pain in left leg: Secondary | ICD-10-CM | POA: Insufficient documentation

## 2023-12-15 DIAGNOSIS — Z7984 Long term (current) use of oral hypoglycemic drugs: Secondary | ICD-10-CM | POA: Diagnosis not present

## 2023-12-15 DIAGNOSIS — Z79899 Other long term (current) drug therapy: Secondary | ICD-10-CM | POA: Insufficient documentation

## 2023-12-15 DIAGNOSIS — Z794 Long term (current) use of insulin: Secondary | ICD-10-CM | POA: Diagnosis not present

## 2023-12-15 DIAGNOSIS — E119 Type 2 diabetes mellitus without complications: Secondary | ICD-10-CM | POA: Insufficient documentation

## 2023-12-15 NOTE — ED Triage Notes (Signed)
 Pt reports multiple falls in past couple of weeks landing on bilateral knees. Hx of cellulitis in legs. Pt reports increased swelling in LLE with substantial pain. Pt checked for DVT in LLE yesterday and was told he did not have any. Pt ambulating independently with steady gait. Pt reports balance issues leading to falls. Pt awake and alert, A&Ox4. Denies headache. (-) thinners.

## 2023-12-16 MED ORDER — ACETAMINOPHEN 500 MG PO TABS
1000.0000 mg | ORAL_TABLET | Freq: Once | ORAL | Status: AC
  Administered 2023-12-16: 1000 mg via ORAL
  Filled 2023-12-16: qty 2

## 2023-12-16 MED ORDER — DOXYCYCLINE HYCLATE 100 MG PO CAPS: 100.0000 mg | ORAL_CAPSULE | Freq: Two times a day (BID) | ORAL | 0 refills | Status: DC

## 2023-12-16 MED ORDER — DOXYCYCLINE HYCLATE 100 MG PO TABS
100.0000 mg | ORAL_TABLET | Freq: Once | ORAL | Status: AC
  Administered 2023-12-16: 100 mg via ORAL
  Filled 2023-12-16: qty 1

## 2023-12-16 MED ORDER — IBUPROFEN 200 MG PO TABS
400.0000 mg | ORAL_TABLET | Freq: Once | ORAL | Status: AC
  Administered 2023-12-16: 400 mg via ORAL
  Filled 2023-12-16: qty 2

## 2023-12-16 NOTE — ED Provider Notes (Signed)
 [place] EMERGENCY DEPARTMENT AT [facility] Provider Note   CSN: [id number] Arrival date & time: 12/15/23  1918     History  Chief Complaint  Patient presents with   Leg Swelling   Leg Injury         James Roberson is a 78 y.o. male.  The history is provided by the patient.  Leg Pain Location:  Leg Time since incident: days. Lower extremity injury: fell several days ago.   Leg location:  L lower leg Pain details:    Radiates to:  Does not radiate   Severity:  Moderate   Timing:  Constant   Progression:  Unchanged Relieved by:  Nothing Worsened by:  Nothing Ineffective treatments:  None tried Associated symptoms: no back pain and no fever   Risk factors: no concern for non-accidental trauma   Patient with DM who fell and has been seen and had Xrays and DVT study both of which were negative but is concerned it is turning into cellulitis as it is still swollen and painful.  No redness.  No fever     Past Medical History:  Diagnosis Date   Diabetes mellitus without complication (HCC)    Hyperlipidemia    Hypertension    Intervertebral disk disease    cervical   Obesity    Psoriasis    Tremor      Home Medications Prior to Admission medications   Medication Sig Start Date End Date Taking? Authorizing Provider  amLODipine  (NORVASC ) 5 MG tablet TAKE 1 TABLET EVERY DAY 10/05/23   Avanell Leigh, MD  aspirin (ASPIRIN 81) 81 MG chewable tablet 1 tablet 04/06/21   [provider]  atorvastatin  (LIPITOR) 80 MG tablet Take 80 mg by mouth daily.    [provider]  Blood Glucose Monitoring Suppl (TRUE METRIX METER) w/Device KIT See admin instructions.    [provider]  buPROPion  (WELLBUTRIN  XL) 150 MG 24 hr tablet Take 150 mg by mouth every morning.    [provider]  gabapentin  (NEURONTIN ) 300 MG capsule Take 600 mg by mouth at bedtime.    [provider]  insulin  aspart (NOVOLOG  FLEXPEN) 100 UNIT/ML  FlexPen 2-10 units 3 times per day on sliding scale Subcutaneous 30 days 11/14/23     insulin  glargine, 1 Unit Dial , (TOUJEO  SOLOSTAR) 300 UNIT/ML Solostar Pen Inject 30 Units into the skin daily. 11/14/23     Insulin  Pen Needle (BD PEN NEEDLE NANO 2ND GEN) 32G X 4 MM MISC one injection per day for 90 days 01/31/23   [provider]  lisinopril  (ZESTRIL ) 40 MG tablet Take 40 mg by mouth daily.    [provider]  metFORMIN (GLUCOPHAGE-XR) 500 MG 24 hr tablet Take 1,000 mg by mouth 2 (two) times daily.    [provider]  sertraline  (ZOLOFT ) 50 MG tablet Take 0.5 tablet by mouth daily for 4 days, THEN Take 1 tablet (50 mg total) by mouth in the morning. 03/17/23         Allergies    Avelox abc [moxifloxacin], Tape, Ambien  cr [zolpidem  tartrate er], Jardiance [empagliflozin], Mounjaro [tirzepatide], Trulicity [dulaglutide], Zolpidem , and Other    Review of Systems   Review of Systems  Constitutional:  Negative for fever.  Gastrointestinal:  Negative for nausea and vomiting.  Musculoskeletal:  Positive for arthralgias. Negative for back pain.  All other systems reviewed and are negative.   Physical Exam Updated Vital Signs BP (!) 173/77 (BP Location: Left  Arm)   Pulse 75   Temp 98.5 F (36.9 C) (Oral)   Resp 18   Ht 5' 8.5" (1.74 m)   Wt 86.2 kg   SpO2 99%   BMI 28.47 kg/m  Physical Exam Vitals and nursing note reviewed.  Constitutional:      General: He is not in acute distress.    Appearance: Normal appearance. He is well-developed. He is not diaphoretic.  HENT:     Head: Normocephalic and atraumatic.     Nose: Nose normal.  Eyes:     Conjunctiva/sclera: Conjunctivae normal.     Pupils: Pupils are equal, round, and reactive to light.  Cardiovascular:     Rate and Rhythm: Normal rate and regular rhythm.     Pulses: Normal pulses.     Heart sounds: Normal heart sounds.  Pulmonary:     Effort: Pulmonary effort is normal.     Breath sounds: Normal breath  sounds. No wheezing or rales.  Abdominal:     General: Bowel sounds are normal.     Palpations: Abdomen is soft.     Tenderness: There is no abdominal tenderness. There is no guarding or rebound.  Musculoskeletal:        General: No signs of injury. Normal range of motion.     Cervical back: Normal range of motion and neck supple.     Left knee: No swelling, deformity, erythema, ecchymosis or lacerations. Normal range of motion. No ACL laxity or PCL laxity.    Comments: Warmth papular  eruption   Skin:    General: Skin is warm and dry.     Capillary Refill: Capillary refill takes less than 2 seconds.     Findings: Erythema present.  Neurological:     General: No focal deficit present.     Mental Status: He is alert and oriented to person, place, and time.     ED Results / Procedures / Treatments   Labs (all labs ordered are listed, but only abnormal results are displayed) Labs Reviewed - No data to display  EKG None  Radiology US  Venous Img Lower  Left (DVT Study) Result Date: 12/14/2023 CLINICAL DATA:  Left lower extremity pain, swelling EXAM: LEFT LOWER EXTREMITY VENOUS DOPPLER ULTRASOUND TECHNIQUE: Gray-scale sonography with compression, as well as color and duplex ultrasound, were performed to evaluate the deep venous system(s) from the level of the common femoral vein through the popliteal and proximal calf veins. COMPARISON:  None Available. FINDINGS: VENOUS Normal compressibility of the common femoral, superficial femoral, and popliteal veins, as well as the visualized calf veins. Visualized portions of profunda femoral vein and great saphenous vein unremarkable. No filling defects to suggest DVT on grayscale or color Doppler imaging. Doppler waveforms show normal direction of venous flow, normal respiratory plasticity and response to augmentation. Limited views of the contralateral common femoral vein are unremarkable. OTHER Probable left knee joint effusion. Limitations: none  IMPRESSION: No evidence of left lower extremity DVT. Electronically Signed   By: Janeece Mechanic M.D.   On: 12/14/2023 18:47   DG Tibia/Fibula Left Result Date: 12/14/2023 CLINICAL DATA:  Lower leg pain after falling 1 week ago. EXAM: LEFT TIBIA AND FIBULA - 2 VIEW COMPARISON:  None Available. FINDINGS: The bones appear adequately mineralized. No definite acute fracture or dislocation. There are tiny ossific densities adjacent to the fibular tip which are nonspecific and potentially small avulsion fractures. There is distal soft tissue swelling which appears greatest laterally. Scattered vascular calcifications are noted. No  significant joint space narrowing at the knee or ankle. IMPRESSION: Possible tiny avulsion fractures from the distal fibula. Correlate with point tenderness. No other evidence of acute fracture or dislocation. Electronically Signed   By: Elmon Hagedorn M.D.   On: 12/14/2023 16:36    Procedures Procedures    Medications Ordered in ED Medications  doxycycline  (VIBRA -TABS) tablet 100 mg (100 mg Oral Given 12/16/23 0119)  acetaminophen  (TYLENOL ) tablet 1,000 mg (1,000 mg Oral Given 12/16/23 0123)  ibuprofen (ADVIL) tablet 400 mg (400 mg Oral Given 12/16/23 0123)    ED Course/ Medical Decision Making/ A&P                                 Medical Decision Making Patient concerned for leg pain turning into cellulitis   Amount and/or Complexity of Data Reviewed Independent Historian: spouse    Details: See above  External Data Reviewed: labs and notes.    Details: Previous visits reviewed   Risk OTC drugs. Prescription drug management. Risk Details: Patient without erythema,  no fluctuance but given the history I am happy to prescribe doxycycline .  I have advised follow up with pMD for ongoing care.  Stable for discharge.      Final Clinical Impression(s) / ED Diagnoses Final diagnoses:  Pain of left lower extremity    No signs of systemic illness or infection. The  patient is nontoxic-appearing on exam and vital signs are within normal limits.  I have reviewed the triage vital signs and the nursing notes. Pertinent labs & imaging results that were available during my care of the patient were reviewed by me and considered in my medical decision making (see chart for details). After history, exam, and medical workup I feel the patient has been appropriately medically screened and is safe for discharge home. Pertinent diagnoses were discussed with the patient. Patient was given return precautions.    Rx / DC Orders ED Discharge Orders     None         Jaleen Finch, MD 12/16/23 8119

## 2023-12-20 DIAGNOSIS — I10 Essential (primary) hypertension: Secondary | ICD-10-CM | POA: Diagnosis not present

## 2023-12-20 DIAGNOSIS — R600 Localized edema: Secondary | ICD-10-CM | POA: Diagnosis not present

## 2023-12-20 DIAGNOSIS — L0390 Cellulitis, unspecified: Secondary | ICD-10-CM | POA: Diagnosis not present

## 2024-01-04 DIAGNOSIS — E119 Type 2 diabetes mellitus without complications: Secondary | ICD-10-CM | POA: Diagnosis not present

## 2024-01-05 ENCOUNTER — Other Ambulatory Visit: Payer: Self-pay | Admitting: Cardiovascular Disease

## 2024-02-29 DIAGNOSIS — E1165 Type 2 diabetes mellitus with hyperglycemia: Secondary | ICD-10-CM | POA: Diagnosis not present

## 2024-03-13 DIAGNOSIS — I10 Essential (primary) hypertension: Secondary | ICD-10-CM | POA: Diagnosis not present

## 2024-03-13 DIAGNOSIS — E1165 Type 2 diabetes mellitus with hyperglycemia: Secondary | ICD-10-CM | POA: Diagnosis not present

## 2024-03-13 DIAGNOSIS — E118 Type 2 diabetes mellitus with unspecified complications: Secondary | ICD-10-CM | POA: Diagnosis not present

## 2024-03-15 DIAGNOSIS — Z125 Encounter for screening for malignant neoplasm of prostate: Secondary | ICD-10-CM | POA: Diagnosis not present

## 2024-03-15 DIAGNOSIS — E782 Mixed hyperlipidemia: Secondary | ICD-10-CM | POA: Diagnosis not present

## 2024-03-15 DIAGNOSIS — I10 Essential (primary) hypertension: Secondary | ICD-10-CM | POA: Diagnosis not present

## 2024-03-20 DIAGNOSIS — N2581 Secondary hyperparathyroidism of renal origin: Secondary | ICD-10-CM | POA: Diagnosis not present

## 2024-03-20 DIAGNOSIS — Z0000 Encounter for general adult medical examination without abnormal findings: Secondary | ICD-10-CM | POA: Diagnosis not present

## 2024-03-20 DIAGNOSIS — E1142 Type 2 diabetes mellitus with diabetic polyneuropathy: Secondary | ICD-10-CM | POA: Diagnosis not present

## 2024-03-20 DIAGNOSIS — I2510 Atherosclerotic heart disease of native coronary artery without angina pectoris: Secondary | ICD-10-CM | POA: Diagnosis not present

## 2024-03-20 DIAGNOSIS — I10 Essential (primary) hypertension: Secondary | ICD-10-CM | POA: Diagnosis not present

## 2024-03-20 DIAGNOSIS — D631 Anemia in chronic kidney disease: Secondary | ICD-10-CM | POA: Diagnosis not present

## 2024-03-20 DIAGNOSIS — M1A0710 Idiopathic chronic gout, right ankle and foot, without tophus (tophi): Secondary | ICD-10-CM | POA: Diagnosis not present

## 2024-03-20 DIAGNOSIS — N1831 Chronic kidney disease, stage 3a: Secondary | ICD-10-CM | POA: Diagnosis not present

## 2024-03-20 DIAGNOSIS — F411 Generalized anxiety disorder: Secondary | ICD-10-CM | POA: Diagnosis not present

## 2024-04-04 ENCOUNTER — Other Ambulatory Visit: Payer: Self-pay | Admitting: Cardiovascular Disease

## 2024-04-22 DIAGNOSIS — I2510 Atherosclerotic heart disease of native coronary artery without angina pectoris: Secondary | ICD-10-CM | POA: Diagnosis not present

## 2024-04-23 DIAGNOSIS — I2510 Atherosclerotic heart disease of native coronary artery without angina pectoris: Secondary | ICD-10-CM | POA: Diagnosis not present

## 2024-04-23 DIAGNOSIS — E1165 Type 2 diabetes mellitus with hyperglycemia: Secondary | ICD-10-CM | POA: Diagnosis not present

## 2024-04-23 DIAGNOSIS — I10 Essential (primary) hypertension: Secondary | ICD-10-CM | POA: Diagnosis not present

## 2024-04-23 DIAGNOSIS — Z23 Encounter for immunization: Secondary | ICD-10-CM | POA: Diagnosis not present

## 2024-04-23 DIAGNOSIS — N1832 Chronic kidney disease, stage 3b: Secondary | ICD-10-CM | POA: Diagnosis not present

## 2024-06-12 DIAGNOSIS — E1165 Type 2 diabetes mellitus with hyperglycemia: Secondary | ICD-10-CM | POA: Diagnosis not present

## 2024-06-16 ENCOUNTER — Emergency Department (HOSPITAL_BASED_OUTPATIENT_CLINIC_OR_DEPARTMENT_OTHER): Admission: EM | Admit: 2024-06-16 | Discharge: 2024-06-16 | Disposition: A | Discharge status: Home / Self Care

## 2024-06-16 ENCOUNTER — Emergency Department (HOSPITAL_BASED_OUTPATIENT_CLINIC_OR_DEPARTMENT_OTHER)

## 2024-06-16 ENCOUNTER — Encounter (HOSPITAL_BASED_OUTPATIENT_CLINIC_OR_DEPARTMENT_OTHER): Payer: Self-pay | Admitting: Emergency Medicine

## 2024-06-16 DIAGNOSIS — L03115 Cellulitis of right lower limb: Secondary | ICD-10-CM | POA: Insufficient documentation

## 2024-06-16 DIAGNOSIS — I10 Essential (primary) hypertension: Secondary | ICD-10-CM | POA: Diagnosis not present

## 2024-06-16 DIAGNOSIS — E119 Type 2 diabetes mellitus without complications: Secondary | ICD-10-CM | POA: Insufficient documentation

## 2024-06-16 DIAGNOSIS — Z794 Long term (current) use of insulin: Secondary | ICD-10-CM | POA: Insufficient documentation

## 2024-06-16 DIAGNOSIS — M7989 Other specified soft tissue disorders: Secondary | ICD-10-CM | POA: Diagnosis not present

## 2024-06-16 DIAGNOSIS — M79604 Pain in right leg: Secondary | ICD-10-CM | POA: Diagnosis not present

## 2024-06-16 DIAGNOSIS — Z7982 Long term (current) use of aspirin: Secondary | ICD-10-CM | POA: Diagnosis not present

## 2024-06-16 LAB — COMPREHENSIVE METABOLIC PANEL WITH GFR
ALT: 20 U/L (ref 0–44)
AST: 18 U/L (ref 15–41)
Albumin: 4.4 g/dL (ref 3.5–5.0)
Alkaline Phosphatase: 114 U/L (ref 38–126)
Anion gap: 17 — ABNORMAL HIGH (ref 5–15)
BUN: 34 mg/dL — ABNORMAL HIGH (ref 8–23)
CO2: 19 mmol/L — ABNORMAL LOW (ref 22–32)
Calcium: 9.8 mg/dL (ref 8.9–10.3)
Chloride: 102 mmol/L (ref 98–111)
Creatinine, Ser: 2.26 mg/dL — ABNORMAL HIGH (ref 0.61–1.24)
GFR, Estimated: 29 mL/min — ABNORMAL LOW (ref >60)
Glucose, Bld: 189 mg/dL — ABNORMAL HIGH (ref 70–99)
Potassium: 4.8 mmol/L (ref 3.5–5.1)
Sodium: 138 mmol/L (ref 135–145)
Total Bilirubin: 0.3 mg/dL (ref 0.0–1.2)
Total Protein: 7.4 g/dL (ref 6.5–8.1)

## 2024-06-16 LAB — CBC WITH DIFFERENTIAL/PLATELET
Abs Immature Granulocytes: 0.05 K/uL (ref 0.00–0.07)
Basophils Absolute: 0.1 K/uL (ref 0.0–0.1)
Basophils Relative: 1 %
Eosinophils Absolute: 0.5 K/uL (ref 0.0–0.5)
Eosinophils Relative: 5 %
HCT: 39.6 % (ref 39.0–52.0)
Hemoglobin: 12.9 g/dL — ABNORMAL LOW (ref 13.0–17.0)
Immature Granulocytes: 1 %
Lymphocytes Relative: 26 %
Lymphs Abs: 2.7 K/uL (ref 0.7–4.0)
MCH: 28 pg (ref 26.0–34.0)
MCHC: 32.6 g/dL (ref 30.0–36.0)
MCV: 86.1 fL (ref 80.0–100.0)
Monocytes Absolute: 0.8 K/uL (ref 0.1–1.0)
Monocytes Relative: 8 %
Neutro Abs: 6.2 K/uL (ref 1.7–7.7)
Neutrophils Relative %: 59 %
Platelets: 329 K/uL (ref 150–400)
RBC: 4.6 MIL/uL (ref 4.22–5.81)
RDW: 12.6 % (ref 11.5–15.5)
WBC: 10.2 K/uL (ref 4.0–10.5)
nRBC: 0 % (ref 0.0–0.2)

## 2024-06-16 MED ORDER — SULFAMETHOXAZOLE-TRIMETHOPRIM 800-160 MG PO TABS: 1.0000 | ORAL_TABLET | Freq: Two times a day (BID) | ORAL | 0 refills | Status: AC

## 2024-06-16 NOTE — ED Triage Notes (Signed)
 Pt c/o pain to lower RLE since Wed; reports that it is red, painful and warm; hx of cellulitis

## 2024-06-16 NOTE — ED Notes (Signed)
 Pt in ultrasound

## 2024-06-16 NOTE — Discharge Instructions (Signed)
 Please follow-up with your primary doctor.  He may take over-the-counter medication such as Tylenol  ibuprofen  for pain.  We are prescribing antibiotics, please take as prescribed for the full course even if symptoms improve.  If your symptoms worsen however, please return back to the emergency department for reevaluation.  Increased pain, swelling, redness spreads, leg becomes blue, cold, pale, weakness or any new or worsening symptoms that are concerning to you.

## 2024-06-16 NOTE — ED Provider Notes (Signed)
 [place] [facility] AT [facility] Provider Note   CSN: [id number] Arrival date & time: 06/16/24  1135     Patient presents with: Leg Pain   James Roberson is a 78 y.o. male.   78 year old with right lower extremity pain and redness.  Symptoms since Wednesday.  History of the same.  Reports abscesses and has been drained.  No fevers or chills.  Is able to ambulate, but having some pain.  Taking ibuprofen  and has helped.   Leg Pain      Prior to Admission medications   Medication Sig Start Date End Date Taking? Authorizing Provider  sulfamethoxazole-trimethoprim (BACTRIM DS) 800-160 MG tablet Take 1 tablet by mouth 2 (two) times daily for 7 days. 06/16/24 06/23/24 Yes Klye Besecker, Caron J, DO  amLODipine  (NORVASC ) 5 MG tablet TAKE 1 TABLET EVERY DAY (NEED MD APPOINTMENT FOR REFILLS) 04/04/24   Court Dorn PARAS, MD  aspirin (ASPIRIN 81) 81 MG chewable tablet 1 tablet 04/06/21   [provider]  atorvastatin  (LIPITOR) 80 MG tablet Take 80 mg by mouth daily.    [provider]  Blood Glucose Monitoring Suppl (TRUE METRIX METER) w/Device KIT See admin instructions.    [provider]  buPROPion  (WELLBUTRIN  XL) 150 MG 24 hr tablet Take 150 mg by mouth every morning.    [provider]  doxycycline  (VIBRAMYCIN ) 100 MG capsule Take 1 capsule (100 mg total) by mouth 2 (two) times daily. One po bid x 7 days 12/16/23   Palumbo, April, MD  gabapentin  (NEURONTIN ) 300 MG capsule Take 600 mg by mouth at bedtime.    [provider]  insulin  aspart (NOVOLOG  FLEXPEN) 100 UNIT/ML FlexPen 2-10 units 3 times per day on sliding scale Subcutaneous 30 days 11/14/23     insulin  glargine, 1 Unit Dial , (TOUJEO  SOLOSTAR) 300 UNIT/ML Solostar Pen Inject 30 Units into the skin daily. 11/14/23     Insulin  Pen Needle (BD PEN NEEDLE NANO 2ND GEN) 32G X 4 MM MISC one injection per day for 90 days 01/31/23   [provider]  lisinopril  (ZESTRIL ) 40 MG  tablet Take 40 mg by mouth daily.    [provider]  metFORMIN (GLUCOPHAGE-XR) 500 MG 24 hr tablet Take 1,000 mg by mouth 2 (two) times daily.    [provider]  sertraline  (ZOLOFT ) 50 MG tablet Take 0.5 tablet by mouth daily for 4 days, THEN Take 1 tablet (50 mg total) by mouth in the morning. 03/17/23       Allergies: Avelox abc [moxifloxacin], Tape, Ambien  cr [zolpidem  tartrate er], Jardiance [empagliflozin], Mounjaro [tirzepatide], Trulicity [dulaglutide], Zolpidem , and Other    Review of Systems  Updated Vital Signs BP (!) 157/95   Pulse 95   Temp 98.1 F (36.7 C) (Oral)   Resp 17   Ht 5' 8.5 (1.74 m)   Wt 86.2 kg   SpO2 98%   BMI 28.47 kg/m   Physical Exam Vitals and nursing note reviewed.  Constitutional:      General: He is not in acute distress.    Appearance: He is not toxic-appearing.  Eyes:     Conjunctiva/sclera: Conjunctivae normal.  Cardiovascular:     Rate and Rhythm: Normal rate and regular rhythm.  Pulmonary:     Effort: Pulmonary effort is normal.  Abdominal:     General: There is no distension.     Tenderness: There is no abdominal tenderness. There is no guarding or rebound.  Musculoskeletal:  Comments: Right lower extremity with some swelling compared to the left calf.  There is an 2 to 3 cm area of induration.  No fluctuance.  No crepitus.  Patient ambulatory.  2+ DP pulses bilaterally.  Normal sensation.  Soft compartments.  Skin:    General: Skin is warm and dry.     Capillary Refill: Capillary refill takes less than 2 seconds.  Neurological:     General: No focal deficit present.     Mental Status: He is alert and oriented to person, place, and time.     (all labs ordered are listed, but only abnormal results are displayed) Labs Reviewed  CBC WITH DIFFERENTIAL/PLATELET - Abnormal; Notable for the following components:      Result Value   Hemoglobin 12.9 (*)    All other components within normal limits  COMPREHENSIVE  METABOLIC PANEL WITH GFR - Abnormal; Notable for the following components:   CO2 19 (*)    Glucose, Bld 189 (*)    BUN 34 (*)    Creatinine, Ser 2.26 (*)    GFR, Estimated 29 (*)    Anion gap 17 (*)    All other components within normal limits    EKG: None  Radiology: US  Venous Img Lower Unilateral Right Result Date: 06/16/2024 CLINICAL DATA:  Right lower extremity pain and swelling EXAM: RIGHT LOWER EXTREMITY VENOUS DOPPLER ULTRASOUND TECHNIQUE: Gray-scale sonography with compression, as well as color and duplex ultrasound, were performed to evaluate the deep venous system(s) from the level of the common femoral vein through the popliteal and proximal calf veins. COMPARISON:  None Available. FINDINGS: VENOUS Normal compressibility of the common femoral, superficial femoral, and popliteal veins, as well as the visualized calf veins. Visualized portions of profunda femoral vein and great saphenous vein unremarkable. No filling defects to suggest DVT on grayscale or color Doppler imaging. Doppler waveforms show normal direction of venous flow, normal respiratory plasticity and response to augmentation. Limited views of the contralateral common femoral vein are unremarkable. OTHER None. Limitations: none IMPRESSION: Negative. Electronically Signed   By: Wilkie Lent M.D.   On: 06/16/2024 13:08     Procedures   Medications Ordered in the ED - No data to display                                  Medical Decision Making 78 year old with complex medical history to include diabetes hypertension hyperlipidemia, presenting emergency department with pain and redness to his right lower extremity.  Afebrile, mildly elevated heart rate.  Labs without leukocytosis patient Systemic infection.  He is at baseline renal function.  Ultrasound to evaluate for clot was negative.  Appears to have localized cellulitis.  Bedside ultrasound by myself with possible half centimeter area of fluid; possible  abscess. Offered needle aspiration, patient declined and would like to try antibiotics first.  Will discharge in stable condition with antibiotics.  Patient to follow-up with primary doctor.  Return precautions given.  Amount and/or Complexity of Data Reviewed Independent Historian:     Details: Wife notes he is allergic to fluoroquinolones Labs: ordered.  Risk Prescription drug management.       Final diagnoses:  Cellulitis of right lower extremity    ED Discharge Orders          Ordered    sulfamethoxazole-trimethoprim (BACTRIM DS) 800-160 MG tablet  2 times daily        06/16/24 1358  Neysa Caron PARAS, DO 06/16/24 1527

## 2024-06-19 DIAGNOSIS — E1165 Type 2 diabetes mellitus with hyperglycemia: Secondary | ICD-10-CM | POA: Diagnosis not present

## 2024-06-19 DIAGNOSIS — I10 Essential (primary) hypertension: Secondary | ICD-10-CM | POA: Diagnosis not present

## 2024-06-19 DIAGNOSIS — E782 Mixed hyperlipidemia: Secondary | ICD-10-CM | POA: Diagnosis not present

## 2024-06-19 DIAGNOSIS — N1831 Chronic kidney disease, stage 3a: Secondary | ICD-10-CM | POA: Diagnosis not present

## 2024-06-25 ENCOUNTER — Other Ambulatory Visit (HOSPITAL_BASED_OUTPATIENT_CLINIC_OR_DEPARTMENT_OTHER): Payer: Self-pay

## 2024-06-25 DIAGNOSIS — L02415 Cutaneous abscess of right lower limb: Secondary | ICD-10-CM | POA: Diagnosis not present

## 2024-06-25 DIAGNOSIS — L03115 Cellulitis of right lower limb: Secondary | ICD-10-CM | POA: Diagnosis not present

## 2024-06-25 MED ORDER — CLINDAMYCIN HCL 300 MG PO CAPS
300.0000 mg | ORAL_CAPSULE | Freq: Four times a day (QID) | ORAL | 0 refills | Status: DC
  Filled 2024-06-25: qty 28, 7d supply, fill #0

## 2024-06-27 ENCOUNTER — Emergency Department (HOSPITAL_COMMUNITY)

## 2024-06-27 ENCOUNTER — Emergency Department (HOSPITAL_COMMUNITY)
Admission: EM | Admit: 2024-06-27 | Discharge: 2024-06-27 | Disposition: A | Discharge status: Home / Self Care | Source: Ambulatory Visit | Attending: Emergency Medicine | Admitting: Emergency Medicine

## 2024-06-27 ENCOUNTER — Other Ambulatory Visit: Payer: Self-pay

## 2024-06-27 DIAGNOSIS — Z79899 Other long term (current) drug therapy: Secondary | ICD-10-CM | POA: Diagnosis not present

## 2024-06-27 DIAGNOSIS — I10 Essential (primary) hypertension: Secondary | ICD-10-CM | POA: Insufficient documentation

## 2024-06-27 DIAGNOSIS — Z7984 Long term (current) use of oral hypoglycemic drugs: Secondary | ICD-10-CM | POA: Insufficient documentation

## 2024-06-27 DIAGNOSIS — Z794 Long term (current) use of insulin: Secondary | ICD-10-CM | POA: Diagnosis not present

## 2024-06-27 DIAGNOSIS — L089 Local infection of the skin and subcutaneous tissue, unspecified: Secondary | ICD-10-CM | POA: Diagnosis not present

## 2024-06-27 DIAGNOSIS — E119 Type 2 diabetes mellitus without complications: Secondary | ICD-10-CM | POA: Insufficient documentation

## 2024-06-27 DIAGNOSIS — Z7982 Long term (current) use of aspirin: Secondary | ICD-10-CM | POA: Insufficient documentation

## 2024-06-27 DIAGNOSIS — M7989 Other specified soft tissue disorders: Secondary | ICD-10-CM | POA: Diagnosis present

## 2024-06-27 DIAGNOSIS — L03115 Cellulitis of right lower limb: Secondary | ICD-10-CM | POA: Insufficient documentation

## 2024-06-27 LAB — CBC WITH DIFFERENTIAL/PLATELET
Abs Immature Granulocytes: 0.03 K/uL (ref 0.00–0.07)
Basophils Absolute: 0.1 K/uL (ref 0.0–0.1)
Basophils Relative: 1 %
Eosinophils Absolute: 0.5 K/uL (ref 0.0–0.5)
Eosinophils Relative: 8 %
HCT: 41 % (ref 39.0–52.0)
Hemoglobin: 12.6 g/dL — ABNORMAL LOW (ref 13.0–17.0)
Immature Granulocytes: 1 %
Lymphocytes Relative: 36 %
Lymphs Abs: 2 K/uL (ref 0.7–4.0)
MCH: 27.9 pg (ref 26.0–34.0)
MCHC: 30.7 g/dL (ref 30.0–36.0)
MCV: 90.9 fL (ref 80.0–100.0)
Monocytes Absolute: 0.5 K/uL (ref 0.1–1.0)
Monocytes Relative: 9 %
Neutro Abs: 2.5 K/uL (ref 1.7–7.7)
Neutrophils Relative %: 45 %
Platelets: 395 K/uL (ref 150–400)
RBC: 4.51 MIL/uL (ref 4.22–5.81)
RDW: 12.4 % (ref 11.5–15.5)
WBC: 5.6 K/uL (ref 4.0–10.5)
nRBC: 0 % (ref 0.0–0.2)

## 2024-06-27 LAB — BASIC METABOLIC PANEL WITH GFR
Anion gap: 12 (ref 5–15)
BUN: 29 mg/dL — ABNORMAL HIGH (ref 8–23)
CO2: 18 mmol/L — ABNORMAL LOW (ref 22–32)
Calcium: 9.7 mg/dL (ref 8.9–10.3)
Chloride: 106 mmol/L (ref 98–111)
Creatinine, Ser: 2.13 mg/dL — ABNORMAL HIGH (ref 0.61–1.24)
GFR, Estimated: 31 mL/min — ABNORMAL LOW (ref >60)
Glucose, Bld: 219 mg/dL — ABNORMAL HIGH (ref 70–99)
Potassium: 5.1 mmol/L (ref 3.5–5.1)
Sodium: 136 mmol/L (ref 135–145)

## 2024-06-27 NOTE — ED Provider Notes (Signed)
 [place] EMERGENCY DEPARTMENT AT [facility] Provider Note   CSN: [id number] Arrival date & time: 06/27/24  1402     Patient presents with: Wound Infection   James Roberson is a 78 y.o. male.   The history is provided by the patient.  Patient history of diabetes, hypertension, recurrent lower extremity infections presents with pain redness and drainage from an abscess to his right calf. Patient reports this for started around November 5 He was seen in the ER around November 9 and was placed on oral antibiotics-Bactrim He completed a course and then was seen by his PCP and was placed on clindamycin .  He is currently taking clindamycin  4 times daily.  His PCP did not feel comfortable performing an I&D so they attempted to referrals to outpatient surgeons.  This was unsuccessful, therefore the PCP told him to go to ER Patient reports several days ago the wound started draining spontaneously.  Overall he reports feeling improved.  No fevers or vomiting.  He is still ambulatory.  He reports he has had recurrent infections in the right leg for over 20 years since he traveled to [place]   Past Medical History:  Diagnosis Date   Diabetes mellitus without complication (HCC)    Hyperlipidemia    Hypertension    Intervertebral disk disease    cervical   Obesity    Psoriasis    Tremor     Prior to Admission medications   Medication Sig Start Date End Date Taking? Authorizing Provider  amLODipine  (NORVASC ) 5 MG tablet TAKE 1 TABLET EVERY DAY (NEED MD APPOINTMENT FOR REFILLS) 04/04/24   Court Dorn PARAS, MD  aspirin (ASPIRIN 81) 81 MG chewable tablet 1 tablet 04/06/21   [provider]  atorvastatin  (LIPITOR) 80 MG tablet Take 80 mg by mouth daily.    [provider]  Blood Glucose Monitoring Suppl (TRUE METRIX METER) w/Device KIT See admin instructions.    [provider]  buPROPion  (WELLBUTRIN  XL) 150 MG 24 hr tablet Take 150 mg by mouth every morning.     [provider]  clindamycin  (CLEOCIN ) 300 MG capsule Take 1 capsule (300 mg) by mouth 4 times a day. 06/25/24     gabapentin  (NEURONTIN ) 300 MG capsule Take 600 mg by mouth at bedtime.    [provider]  insulin  aspart (NOVOLOG  FLEXPEN) 100 UNIT/ML FlexPen 2-10 units 3 times per day on sliding scale Subcutaneous 30 days 11/14/23     insulin  glargine, 1 Unit Dial , (TOUJEO  SOLOSTAR) 300 UNIT/ML Solostar Pen Inject 30 Units into the skin daily. 11/14/23     Insulin  Pen Needle (BD PEN NEEDLE NANO 2ND GEN) 32G X 4 MM MISC one injection per day for 90 days 01/31/23   [provider]  lisinopril  (ZESTRIL ) 40 MG tablet Take 40 mg by mouth daily.    [provider]  metFORMIN (GLUCOPHAGE-XR) 500 MG 24 hr tablet Take 1,000 mg by mouth 2 (two) times daily.    [provider]  sertraline  (ZOLOFT ) 50 MG tablet Take 0.5 tablet by mouth daily for 4 days, THEN Take 1 tablet (50 mg total) by mouth in the morning. 03/17/23       Allergies: Avelox abc [moxifloxacin], Tape, Ambien  cr [zolpidem  tartrate er], Jardiance [empagliflozin], Mounjaro [tirzepatide], Trulicity [dulaglutide], Zolpidem , and Other    Review of Systems  Constitutional:  Negative for fever.  Skin:  Positive for wound.    Updated Vital Signs BP (!) 150/82 (BP Location: Right Arm)  Pulse 73   Temp 97.6 F (36.4 C) (Oral)   Resp 20   SpO2 99%   Physical Exam CONSTITUTIONAL: Well developed/well nourished, no distress HEAD: Normocephalic/atraumatic NEURO: Pt is awake/alert/appropriate, moves all extremitiesx4.  No facial droop.   EXTREMITIES: pulses normal/equal, full ROM Distal pulses equal and intact.  Wound noted to the right calf is mildly tender with overlying erythema and induration.  There is no crepitance.  No active drainage.  There is no streaking proximally or distally. SKIN: warm, see photo  (all labs ordered are listed, but only abnormal results are displayed) Labs Reviewed  BASIC  METABOLIC PANEL WITH GFR - Abnormal; Notable for the following components:      Result Value   CO2 18 (*)    Glucose, Bld 219 (*)    BUN 29 (*)    Creatinine, Ser 2.13 (*)    GFR, Estimated 31 (*)    All other components within normal limits  CBC WITH DIFFERENTIAL/PLATELET - Abnormal; Notable for the following components:   Hemoglobin 12.6 (*)    All other components within normal limits    EKG: None  Radiology: DG Tibia/Fibula Right Result Date: 06/27/2024 CLINICAL DATA:  Infection 2 weeks. EXAM: RIGHT TIBIA AND FIBULA - 2 VIEW COMPARISON:  07/17/2018 FINDINGS: There is no evidence of fracture or other focal bone lesions. Soft tissues are unremarkable. IMPRESSION: Negative. Electronically Signed   By: Toribio Agreste M.D.   On: 06/27/2024 15:23     .Ultrasound ED Soft Tissue  Date/Time: 06/27/2024 11:26 PM  Performed by: Midge Golas, MD Authorized by: Midge Golas, MD   Procedure details:    Indications: localization of abscess     Transverse view:  Visualized   Longitudinal view:  Visualized   Images: archived   Location:    Location: lower extremity     Side:  Right Findings:     no abscess present    Medications Ordered in the ED - No data to display                                  Medical Decision Making  Patient presents for continued infection to his right calf has been ongoing for several weeks He is on a second round of antibiotics and currently taking clindamycin  4 times daily.  He reports several days ago the wound started to drain spontaneously and he is feeling better. Overall his labs are at baseline but I did advise him to take his nightly insulin  for his hyperglycemia. I strongly encouraged him to tightly control his glucose  Patient will continue his clindamycin . Bedside ultrasound did not reveal large pocket of fluid to drain Differential includes cellulitis, abscess, necrotizing fasciitis, DVT. There is no crepitance to suggest  necrotizing fasciitis.  I have personally visualized his x-ray there is no acute findings  Patient is safe for discharge home.  He will continue his antibiotics.  Have attempted to make referrals for wound care.  We discussed strict ER return precautions    Final diagnoses:  Cellulitis of right lower extremity    ED Discharge Orders          Ordered    Ambulatory referral to Wound Clinic        06/27/24 2316               Midge Golas, MD 06/27/24 2327

## 2024-06-27 NOTE — Discharge Instructions (Signed)
 Keep your leg elevated.  Keep the wound clean and make sure you are cleaning it each day. Please continue antibiotics.  If the redness, pain, swelling worsen over the next 48 hours please return to the ER

## 2024-06-27 NOTE — ED Provider Triage Note (Signed)
 Emergency Medicine Provider Triage Evaluation Note  James Roberson , a 78 y.o. male  was evaluated in triage.  Pt complains of RLE infection. Currently on clindamycin   Review of Systems  Positive: Infection on RLE Negative: Fever, chills, N/V  Physical Exam  BP (!) 177/84 (BP Location: Right Arm)   Pulse 80   Temp 97.7 F (36.5 C)   Resp 18   SpO2 99%  Gen:   Awake, no distress   Resp:  Normal effort  MSK:   Moves extremities without difficulty  Other:  Erythema and dried drainage noted to right lower extremity wound  Medical Decision Making  Medically screening exam initiated at 2:35 PM.  Appropriate orders placed.  ABDULAHI Roberson was informed that the remainder of the evaluation will be completed by another provider, this initial triage assessment does not replace that evaluation, and the importance of remaining in the ED until their evaluation is complete.  Labs and imaging ordered   James Roberson 06/27/24 [id number]

## 2024-06-27 NOTE — ED Triage Notes (Signed)
 Complains of RLE infection and has bene on two antibiotics and still complaining of pain and inflammation.  Located to right calf.

## 2024-07-03 ENCOUNTER — Encounter: Payer: Self-pay | Admitting: Family

## 2024-07-03 ENCOUNTER — Ambulatory Visit: Admitting: Family

## 2024-07-03 ENCOUNTER — Other Ambulatory Visit (HOSPITAL_BASED_OUTPATIENT_CLINIC_OR_DEPARTMENT_OTHER): Payer: Self-pay

## 2024-07-03 DIAGNOSIS — L03115 Cellulitis of right lower limb: Secondary | ICD-10-CM | POA: Diagnosis not present

## 2024-07-03 MED ORDER — CLINDAMYCIN HCL 300 MG PO CAPS
300.0000 mg | ORAL_CAPSULE | Freq: Four times a day (QID) | ORAL | 0 refills | Status: DC
  Filled 2024-07-03: qty 28, 7d supply, fill #0

## 2024-07-03 NOTE — Progress Notes (Signed)
 Office Visit Note   Patient: James Roberson           Date of Birth: October 06, 1945           MRN: [id number] Visit Date: 07/03/2024              Requested by: Midge Golas, MD [address] [place],  [place] [address] PCP: Clarice Nottingham, MD  Chief Complaint  Patient presents with   Right Leg - Wound Check      HPI: The patient is a 78 year old gentleman who is seen today for evaluation of cellulitis to the right lower extremity which has been ongoing since November 5 of this year.  he has been seen in the emergency department for the same twice on 11/9 as well as 11/20.  Placed on Bactrim  11/8 completed this course follow-up with his primary care provider unfortunately there was not resolution was started on clindamycin  which he completed yesterday.  Reports had a volcano pocket of fluid draining out of the ulcerative area about a week ago  Has had issues with cellulitis and abscess in the past but has been about a decade since the last episode  No fever chills or drainage since.  Assessment & Plan: Visit Diagnoses:  1. Cellulitis of right lower extremity     Plan: Cellulitis appears to be resolving to the right lower extremity will follow him closely he is aware of return precautions.  Follow-Up Instructions: Return in about 2 weeks (around 07/17/2024).   Ortho Exam  Patient is alert, oriented, no adenopathy, well-dressed, normal affect, normal respiratory effort. On examination right lower extremity medially there is a about 3 cm diameter area of erythema with mild warmth resolving cellulitis there is peeling skin there is no induration nor palpable fluctuance    Imaging: No results found. No images are attached to the encounter.  Labs: Lab Results  Component Value Date   HGBA1C 8.0 (H) 11/10/2014   HGBA1C (H) 03/09/2009    6.5 (NOTE) The ADA recommends the following therapeutic goal for glycemic control related to Hgb A1c measurement: Goal of therapy: <6.5  Hgb A1c  Reference: American Diabetes Association: Clinical Practice Recommendations 2010, Diabetes Care, 2010, 33: (Suppl  1).   HGBA1C (H) 09/04/2008    6.5 (NOTE)   The ADA recommends the following therapeutic goal for glycemic   control related to Hgb A1C measurement:   Goal of Therapy:   < 7.0% Hgb A1C   Reference: American Diabetes Association: Clinical Practice   Recommendations 2008, Diabetes Care,  2008, 31:(Suppl 1).   ESRSEDRATE 25 (H) 07/18/2018   ESRSEDRATE 43 (H) 11/10/2014   ESRSEDRATE 68 (H) 10/30/2014   CRP 1.1 (H) 07/18/2018   CRP <0.5 (L) 11/10/2014   CRP 9.9 (H) 10/30/2014   LABURIC 4.9 10/30/2007   REPTSTATUS 07/25/2018 FINAL 07/20/2018   GRAMSTAIN  07/20/2018    ABUNDANT WBC PRESENT, PREDOMINANTLY PMN NO ORGANISMS SEEN    CULT  07/20/2018    RARE STAPHYLOCOCCUS AUREUS NO ANAEROBES ISOLATED Performed at [facility], [address] [place], [place] [address]    [facility] STAPHYLOCOCCUS AUREUS 07/20/2018     Lab Results  Component Value Date   ALBUMIN 4.4 06/16/2024   ALBUMIN 4.6 01/01/2023   ALBUMIN 4.5 03/30/2022    Lab Results  Component Value Date   MG 2.0 10/29/2007   No results found for: VD25OH  No results found for: PREALBUMIN    Latest Ref Rng & Units 06/27/2024  2:41 PM 06/16/2024   11:57 AM 01/01/2023    4:09 PM  CBC EXTENDED  WBC 4.0 - 10.5 K/uL 5.6  10.2  6.0   RBC 4.22 - 5.81 MIL/uL 4.51  4.60  4.35   Hemoglobin 13.0 - 17.0 g/dL 87.3  87.0  87.1   HCT 39.0 - 52.0 % 41.0  39.6  38.9   Platelets 150 - 400 K/uL 395  329  340   NEUT# 1.7 - 7.7 K/uL 2.5  6.2  3.8   Lymph# 0.7 - 4.0 K/uL 2.0  2.7  1.5      There is no height or weight on file to calculate BMI.  Orders:  No orders of the defined types were placed in this encounter.  Meds ordered this encounter  Medications   DISCONTD: clindamycin  (CLEOCIN ) 300 MG capsule    Sig: Take 1 capsule (300 mg) by mouth 4 times a day.    Dispense:  28 capsule    Refill:  0      Procedures: No procedures performed  Clinical Data: No additional findings.  ROS:  All other systems negative, except as noted in the HPI. Review of Systems  Objective: Vital Signs: There were no vitals taken for this visit.  Specialty Comments:  No specialty comments available.  PMFS History: Patient Active Problem List   Diagnosis Date Noted   Elevated coronary artery calcium  score 06/08/2021   Leg abscess 07/22/2018   Cellulitis of right lower extremity 07/17/2018   Type 2 diabetes mellitus with obesity 07/17/2018   CKD (chronic kidney disease) stage 3, GFR 30-59 ml/min (HCC) 11/11/2014   Cellulitis 10/30/2014   Diabetes mellitus type 2, controlled (HCC) 10/30/2014   Hyperlipidemia 10/30/2014   Renal failure (ARF), acute on chronic 10/30/2014   Sepsis (HCC) 10/30/2014   ULCER, LEG 09/25/2008   DIZZINESS 09/25/2008   DIABETES MELLITUS, TYPE II 09/23/2008   HYPERLIPIDEMIA 09/23/2008   GOUT 09/23/2008   Essential hypertension 09/23/2008   PSORIASIS, HX OF 09/23/2008   Past Medical History:  Diagnosis Date   Diabetes mellitus without complication (HCC)    Hyperlipidemia    Hypertension    Intervertebral disk disease    cervical   Obesity    Psoriasis    Tremor     Family History  Problem Relation Age of Onset   CAD Father     Past Surgical History:  Procedure Laterality Date   BACK SURGERY     I & D EXTREMITY Right 07/20/2018   Procedure: IRRIGATION AND DEBRIDEMENT  OF LEG;  Surgeon: Jerri Kay HERO, MD;  Location: [facility] OR;  Service: Orthopedics;  Laterality: Right;   LEG SURGERY     LUMBAR LAMINECTOMY  1989   Social History   Occupational History   Not on file  Tobacco Use   Smoking status: Former    Current packs/day: 0.00    Types: Cigarettes    Quit date: 07/23/1966    Years since quitting: 58.0   Smokeless tobacco: Never  Vaping Use   Vaping status: Never Used  Substance and Sexual Activity   Alcohol use: Not Currently    Comment: 1 a  quarter   Drug use: Never   Sexual activity: Not on file

## 2024-07-10 ENCOUNTER — Other Ambulatory Visit (HOSPITAL_BASED_OUTPATIENT_CLINIC_OR_DEPARTMENT_OTHER): Payer: Self-pay

## 2024-07-10 MED ORDER — TRAMADOL HCL 50 MG PO TABS
50.0000 mg | ORAL_TABLET | ORAL | 0 refills | Status: AC | PRN
  Filled 2024-07-10: qty 15, 3d supply, fill #0

## 2024-07-11 ENCOUNTER — Other Ambulatory Visit (HOSPITAL_BASED_OUTPATIENT_CLINIC_OR_DEPARTMENT_OTHER): Payer: Self-pay

## 2024-07-11 ENCOUNTER — Other Ambulatory Visit: Payer: Self-pay | Admitting: Family

## 2024-07-11 MED ORDER — CLINDAMYCIN HCL 300 MG PO CAPS
300.0000 mg | ORAL_CAPSULE | Freq: Two times a day (BID) | ORAL | 0 refills | Status: DC
  Filled 2024-07-11: qty 14, 7d supply, fill #0

## 2024-07-12 ENCOUNTER — Encounter: Payer: Self-pay | Admitting: Family

## 2024-07-13 ENCOUNTER — Emergency Department (HOSPITAL_COMMUNITY)

## 2024-07-13 ENCOUNTER — Encounter (HOSPITAL_COMMUNITY): Payer: Self-pay | Admitting: Pharmacy Technician

## 2024-07-13 ENCOUNTER — Other Ambulatory Visit: Payer: Self-pay

## 2024-07-13 ENCOUNTER — Emergency Department (HOSPITAL_COMMUNITY)
Admission: EM | Admit: 2024-07-13 | Discharge: 2024-07-13 | Disposition: A | Discharge status: Home / Self Care | Attending: Emergency Medicine | Admitting: Emergency Medicine

## 2024-07-13 DIAGNOSIS — Z79899 Other long term (current) drug therapy: Secondary | ICD-10-CM | POA: Diagnosis not present

## 2024-07-13 DIAGNOSIS — N132 Hydronephrosis with renal and ureteral calculous obstruction: Secondary | ICD-10-CM | POA: Diagnosis not present

## 2024-07-13 DIAGNOSIS — Z7984 Long term (current) use of oral hypoglycemic drugs: Secondary | ICD-10-CM | POA: Diagnosis not present

## 2024-07-13 DIAGNOSIS — I129 Hypertensive chronic kidney disease with stage 1 through stage 4 chronic kidney disease, or unspecified chronic kidney disease: Secondary | ICD-10-CM | POA: Diagnosis not present

## 2024-07-13 DIAGNOSIS — R1031 Right lower quadrant pain: Secondary | ICD-10-CM | POA: Diagnosis not present

## 2024-07-13 DIAGNOSIS — Z7982 Long term (current) use of aspirin: Secondary | ICD-10-CM | POA: Diagnosis not present

## 2024-07-13 DIAGNOSIS — N200 Calculus of kidney: Secondary | ICD-10-CM | POA: Diagnosis not present

## 2024-07-13 DIAGNOSIS — E875 Hyperkalemia: Secondary | ICD-10-CM | POA: Diagnosis not present

## 2024-07-13 DIAGNOSIS — N179 Acute kidney failure, unspecified: Secondary | ICD-10-CM | POA: Diagnosis not present

## 2024-07-13 DIAGNOSIS — N189 Chronic kidney disease, unspecified: Secondary | ICD-10-CM | POA: Diagnosis not present

## 2024-07-13 DIAGNOSIS — E119 Type 2 diabetes mellitus without complications: Secondary | ICD-10-CM | POA: Diagnosis not present

## 2024-07-13 DIAGNOSIS — K8020 Calculus of gallbladder without cholecystitis without obstruction: Secondary | ICD-10-CM | POA: Diagnosis not present

## 2024-07-13 LAB — URINALYSIS, ROUTINE W REFLEX MICROSCOPIC
Bilirubin Urine: NEGATIVE
Glucose, UA: >=500 mg/dL — AB
Ketones, ur: 5 mg/dL — AB
Leukocytes,Ua: NEGATIVE
Nitrite: NEGATIVE
Protein, ur: 30 mg/dL — AB
RBC / HPF: >50 RBC/hpf (ref 0–5)
Specific Gravity, Urine: 1.012 (ref 1.005–1.030)
pH: 5 (ref 5.0–8.0)

## 2024-07-13 LAB — COMPREHENSIVE METABOLIC PANEL WITH GFR
ALT: 68 U/L — ABNORMAL HIGH (ref 0–44)
AST: 31 U/L (ref 15–41)
Albumin: 3.8 g/dL (ref 3.5–5.0)
Alkaline Phosphatase: 96 U/L (ref 38–126)
Anion gap: 11 (ref 5–15)
BUN: 35 mg/dL — ABNORMAL HIGH (ref 8–23)
CO2: 21 mmol/L — ABNORMAL LOW (ref 22–32)
Calcium: 9.7 mg/dL (ref 8.9–10.3)
Chloride: 100 mmol/L (ref 98–111)
Creatinine, Ser: 2.75 mg/dL — ABNORMAL HIGH (ref 0.61–1.24)
GFR, Estimated: 23 mL/min — ABNORMAL LOW (ref >60)
Glucose, Bld: 265 mg/dL — ABNORMAL HIGH (ref 70–99)
Potassium: 5.4 mmol/L — ABNORMAL HIGH (ref 3.5–5.1)
Sodium: 132 mmol/L — ABNORMAL LOW (ref 135–145)
Total Bilirubin: 0.6 mg/dL (ref 0.0–1.2)
Total Protein: 7.8 g/dL (ref 6.5–8.1)

## 2024-07-13 LAB — LIPASE, BLOOD: Lipase: 33 U/L (ref 11–51)

## 2024-07-13 LAB — CBC
HCT: 42 % (ref 39.0–52.0)
Hemoglobin: 13.2 g/dL (ref 13.0–17.0)
MCH: 27.7 pg (ref 26.0–34.0)
MCHC: 31.4 g/dL (ref 30.0–36.0)
MCV: 88.1 fL (ref 80.0–100.0)
Platelets: 369 K/uL (ref 150–400)
RBC: 4.77 MIL/uL (ref 4.22–5.81)
RDW: 12.6 % (ref 11.5–15.5)
WBC: 13.4 K/uL — ABNORMAL HIGH (ref 4.0–10.5)
nRBC: 0 % (ref 0.0–0.2)

## 2024-07-13 MED ORDER — TAMSULOSIN HCL 0.4 MG PO CAPS: 0.4000 mg | ORAL_CAPSULE | Freq: Every day | ORAL | 0 refills | Status: DC

## 2024-07-13 MED ORDER — SODIUM ZIRCONIUM CYCLOSILICATE 10 G PO PACK
10.0000 g | PACK | Freq: Once | ORAL | Status: AC
  Administered 2024-07-13: 10 g via ORAL
  Filled 2024-07-13: qty 1

## 2024-07-13 MED ORDER — FENTANYL CITRATE (PF) 50 MCG/ML IJ SOSY
50.0000 ug | PREFILLED_SYRINGE | Freq: Once | INTRAMUSCULAR | Status: AC
  Administered 2024-07-13: 50 ug via INTRAVENOUS
  Filled 2024-07-13: qty 1

## 2024-07-13 MED ORDER — ONDANSETRON HCL 4 MG/2ML IJ SOLN
4.0000 mg | Freq: Once | INTRAMUSCULAR | Status: AC
  Administered 2024-07-13: 4 mg via INTRAVENOUS
  Filled 2024-07-13: qty 2

## 2024-07-13 MED ORDER — LACTATED RINGERS IV BOLUS
1000.0000 mL | Freq: Once | INTRAVENOUS | Status: AC
  Administered 2024-07-13: 1000 mL via INTRAVENOUS

## 2024-07-13 MED ORDER — TAMSULOSIN HCL 0.4 MG PO CAPS
0.4000 mg | ORAL_CAPSULE | Freq: Once | ORAL | Status: AC
  Administered 2024-07-13: 0.4 mg via ORAL
  Filled 2024-07-13: qty 1

## 2024-07-13 MED ORDER — TAMSULOSIN HCL 0.4 MG PO CAPS: 0.4000 mg | ORAL_CAPSULE | Freq: Every day | ORAL | 0 refills | Status: AC

## 2024-07-13 NOTE — ED Provider Notes (Signed)
 [place] EMERGENCY DEPARTMENT AT [facility] Provider Note   CSN: [id number] Arrival date & time: 07/13/24  1515     Patient presents with: Abdominal Pain   James Roberson is a 78 y.o. male. T2DM, HTN, HLD, obesity, recurrent lower extremity infections just completed another course of antibiotics after total 4 weeks, on Bactrim  presenting with sudden onset right lower quadrant abdominal pain that started this morning.  History per patient.  Endorses woke up this morning around 5 AM, with significant right lower quadrant abdominal pain.  Denies nausea or vomiting.  Denies change in bowel or bladder habits.  Denies pain or burning with urination, denies any any blood in his urine.  Denies blood in stool.  Denies melanotic stools.  Denies recent fever or illnesses.  States the pain has been progressively worsening throughout the day, therefore presenting to the ED.  No medications prior to arrival.  {Add pertinent medical, surgical, social history, OB history to HPI:32947}  Abdominal Pain      Prior to Admission medications   Medication Sig Start Date End Date Taking? Authorizing Provider  amLODipine  (NORVASC ) 5 MG tablet TAKE 1 TABLET EVERY DAY (NEED MD APPOINTMENT FOR REFILLS) 04/04/24   Court Dorn PARAS, MD  aspirin (ASPIRIN 81) 81 MG chewable tablet 1 tablet 04/06/21   [provider]  atorvastatin  (LIPITOR) 80 MG tablet Take 80 mg by mouth daily.    [provider]  Blood Glucose Monitoring Suppl (TRUE METRIX METER) w/Device KIT See admin instructions.    [provider]  buPROPion  (WELLBUTRIN  XL) 150 MG 24 hr tablet Take 150 mg by mouth every morning.    [provider]  clindamycin  (CLEOCIN ) 300 MG capsule Take 1 capsule (300 mg total) by mouth 2 (two) times daily. 07/11/24     gabapentin  (NEURONTIN ) 300 MG capsule Take 600 mg by mouth at bedtime.    [provider]  insulin  aspart (NOVOLOG  FLEXPEN) 100 UNIT/ML FlexPen 2-10 units 3  times per day on sliding scale Subcutaneous 30 days 11/14/23     insulin  glargine, 1 Unit Dial , (TOUJEO  SOLOSTAR) 300 UNIT/ML Solostar Pen Inject 30 Units into the skin daily. 11/14/23     Insulin  Pen Needle (BD PEN NEEDLE NANO 2ND GEN) 32G X 4 MM MISC one injection per day for 90 days 01/31/23   [provider]  lisinopril  (ZESTRIL ) 40 MG tablet Take 40 mg by mouth daily.    [provider]  metFORMIN (GLUCOPHAGE-XR) 500 MG 24 hr tablet Take 1,000 mg by mouth 2 (two) times daily.    [provider]  sertraline  (ZOLOFT ) 50 MG tablet Take 0.5 tablet by mouth daily for 4 days, THEN Take 1 tablet (50 mg total) by mouth in the morning. 03/17/23     traMADol  (ULTRAM ) 50 MG tablet Take 1 tablet (50 mg total) by mouth every 4 (four) to 6 (six)hours as needed. 07/10/24       Allergies: Avelox abc [moxifloxacin], Tape, Ambien  cr [zolpidem  tartrate er], Jardiance [empagliflozin], Mounjaro [tirzepatide], Trulicity [dulaglutide], Zolpidem , and Other    Review of Systems  Gastrointestinal:  Positive for abdominal pain.    Updated Vital Signs BP (!) 194/94 (BP Location: Right Arm)   Pulse 99   Temp 97.6 F (36.4 C)   Resp (!) 24   SpO2 99%   Physical Exam Vitals and nursing note reviewed.  Constitutional:      General: He is not in acute distress.    Appearance: He  is well-developed.  HENT:     Head: Normocephalic and atraumatic.  Eyes:     Conjunctiva/sclera: Conjunctivae normal.  Cardiovascular:     Rate and Rhythm: Normal rate and regular rhythm.     Heart sounds: No murmur heard. Pulmonary:     Effort: Pulmonary effort is normal. No respiratory distress.     Breath sounds: Normal breath sounds.  Abdominal:     General: Abdomen is flat.     Palpations: Abdomen is soft.     Tenderness: There is abdominal tenderness in the right lower quadrant. There is no right CVA tenderness, left CVA tenderness, guarding or rebound. Positive signs include McBurney's sign. Negative  signs include Murphy's sign.  Musculoskeletal:        General: No swelling.     Cervical back: Neck supple.  Skin:    General: Skin is warm and dry.     Capillary Refill: Capillary refill takes less than 2 seconds.  Neurological:     Mental Status: He is alert and oriented to person, place, and time.  Psychiatric:        Mood and Affect: Mood normal.     (all labs ordered are listed, but only abnormal results are displayed) Labs Reviewed  COMPREHENSIVE METABOLIC PANEL WITH GFR - Abnormal; Notable for the following components:      Result Value   Sodium 132 (*)    Potassium 5.4 (*)    CO2 21 (*)    Glucose, Bld 265 (*)    BUN 35 (*)    Creatinine, Ser 2.75 (*)    ALT 68 (*)    GFR, Estimated 23 (*)    All other components within normal limits  CBC - Abnormal; Notable for the following components:   WBC 13.4 (*)    All other components within normal limits  URINALYSIS, ROUTINE W REFLEX MICROSCOPIC - Abnormal; Notable for the following components:   APPearance HAZY (*)    Glucose, UA >=500 (*)    Hgb urine dipstick LARGE (*)    Ketones, ur 5 (*)    Protein, ur 30 (*)    Bacteria, UA RARE (*)    All other components within normal limits  LIPASE, BLOOD    EKG: None  Radiology: No results found.  {Document cardiac monitor, telemetry assessment procedure when appropriate:32947} Procedures   Medications Ordered in the ED - No data to display    {Click here for ABCD2, HEART and other calculators REFRESH Note before signing:1}                              Medical Decision Making Amount and/or Complexity of Data Reviewed Labs: ordered. Radiology: ordered.  Risk Prescription drug management.   ***  {Document critical care time when appropriate  Document review of labs and clinical decision tools ie CHADS2VASC2, etc  Document your independent review of radiology images and any outside records  Document your discussion with family members, caretakers and with  consultants  Document social determinants of health affecting pt's care  Document your decision making why or why not admission, treatments were needed:32947:::1}   Final diagnoses:  None    ED Discharge Orders     None

## 2024-07-13 NOTE — ED Triage Notes (Signed)
 PT c/o RLQ pain that woke him up out of his sleep this morning.  Denies changes in bowel or bladder habits.  Denies nausea.  States has been on antibiotics for 4 weeks for cellulitis in his RLE.

## 2024-07-13 NOTE — Discharge Instructions (Signed)
 Would recommend taking flomax , and drinks lots and lots of fluids at home to help with passing the stone. I would also recommend straining your urine to evaluate for stone passage.

## 2024-07-15 ENCOUNTER — Other Ambulatory Visit (HOSPITAL_BASED_OUTPATIENT_CLINIC_OR_DEPARTMENT_OTHER): Payer: Self-pay

## 2024-07-15 MED ORDER — CLINDAMYCIN HCL 300 MG PO CAPS
300.0000 mg | ORAL_CAPSULE | Freq: Two times a day (BID) | ORAL | 0 refills | Status: AC
  Filled 2024-07-15: qty 14, 7d supply, fill #0

## 2024-07-17 ENCOUNTER — Ambulatory Visit: Admitting: Family

## 2024-07-17 ENCOUNTER — Encounter: Payer: Self-pay | Admitting: Family

## 2024-07-17 ENCOUNTER — Other Ambulatory Visit (HOSPITAL_BASED_OUTPATIENT_CLINIC_OR_DEPARTMENT_OTHER): Payer: Self-pay

## 2024-07-17 DIAGNOSIS — L03115 Cellulitis of right lower limb: Secondary | ICD-10-CM

## 2024-07-17 MED ORDER — MUPIROCIN 2 % EX OINT
1.0000 | TOPICAL_OINTMENT | Freq: Every day | CUTANEOUS | 11 refills | Status: AC
  Filled 2024-07-17: qty 22, 5d supply, fill #0

## 2024-07-17 MED ORDER — DOXYCYCLINE HYCLATE 100 MG PO CAPS
100.0000 mg | ORAL_CAPSULE | Freq: Two times a day (BID) | ORAL | 0 refills | Status: AC
  Filled 2024-07-17: qty 14, 7d supply, fill #0

## 2024-07-17 NOTE — Progress Notes (Unsigned)
 Office Visit Note   Patient: James Roberson           Date of Birth: [birth year]           MRN: [id number] Visit Date: 07/17/2024              Requested by: Clarice Nottingham, MD [address] [place],  [place] [address] PCP: Clarice Nottingham, MD  Chief Complaint  Patient presents with   Right Leg - Wound Check      HPI:   Assessment & Plan: Visit Diagnoses: No diagnosis found.  Plan: ***  Follow-Up Instructions: No follow-ups on file.   Ortho Exam  Patient is alert, oriented, no adenopathy, well-dressed, normal affect, normal respiratory effort. ***    Imaging: No results found. No images are attached to the encounter.  Labs: Lab Results  Component Value Date   HGBA1C 8.0 (H) 11/10/2014   HGBA1C (H) 03/09/2009    6.5 (NOTE) The ADA recommends the following therapeutic goal for glycemic control related to Hgb A1c measurement: Goal of therapy: <6.5 Hgb A1c  Reference: American Diabetes Association: Clinical Practice Recommendations 2010, Diabetes Care, 2010, 33: (Suppl  1).   HGBA1C (H) 09/04/2008    6.5 (NOTE)   The ADA recommends the following therapeutic goal for glycemic   control related to Hgb A1C measurement:   Goal of Therapy:   < 7.0% Hgb A1C   Reference: American Diabetes Association: Clinical Practice   Recommendations 2008, Diabetes Care,  2008, 31:(Suppl 1).   ESRSEDRATE 25 (H) 07/18/2018   ESRSEDRATE 43 (H) 11/10/2014   ESRSEDRATE 68 (H) 10/30/2014   CRP 1.1 (H) 07/18/2018   CRP <0.5 (L) 11/10/2014   CRP 9.9 (H) 10/30/2014   LABURIC 4.9 10/30/2007   REPTSTATUS 07/25/2018 FINAL 07/20/2018   GRAMSTAIN  07/20/2018    ABUNDANT WBC PRESENT, PREDOMINANTLY PMN NO ORGANISMS SEEN    CULT  07/20/2018    RARE STAPHYLOCOCCUS AUREUS NO ANAEROBES ISOLATED Performed at [facility], [address], [place], [place] [address]    [facility] STAPHYLOCOCCUS AUREUS 07/20/2018     Lab Results  Component Value Date   ALBUMIN 3.8 07/13/2024    ALBUMIN 4.4 06/16/2024   ALBUMIN 4.6 01/01/2023    Lab Results  Component Value Date   MG 2.0 10/29/2007   No results found for: VD25OH  No results found for: PREALBUMIN    Latest Ref Rng & Units 07/13/2024    3:26 PM 06/27/2024    2:41 PM 06/16/2024   11:57 AM  CBC EXTENDED  WBC 4.0 - 10.5 K/uL 13.4  5.6  10.2   RBC 4.22 - 5.81 MIL/uL 4.77  4.51  4.60   Hemoglobin 13.0 - 17.0 g/dL 86.7  87.3  87.0   HCT 39.0 - 52.0 % 42.0  41.0  39.6   Platelets 150 - 400 K/uL 369  395  329   NEUT# 1.7 - 7.7 K/uL  2.5  6.2   Lymph# 0.7 - 4.0 K/uL  2.0  2.7      There is no height or weight on file to calculate BMI.  Orders:  No orders of the defined types were placed in this encounter.  No orders of the defined types were placed in this encounter.    Procedures: No procedures performed  Clinical Data: No additional findings.  ROS:  All other systems negative, except as noted in the HPI. Review of Systems  Objective: Vital Signs: There were no vitals  taken for this visit.  Specialty Comments:  No specialty comments available.  PMFS History: Patient Active Problem List   Diagnosis Date Noted   Elevated coronary artery calcium  score 06/08/2021   Leg abscess 07/22/2018   Cellulitis of right lower extremity 07/17/2018   Type 2 diabetes mellitus with obesity 07/17/2018   CKD (chronic kidney disease) stage 3, GFR 30-59 ml/min (HCC) 11/11/2014   Cellulitis 10/30/2014   Diabetes mellitus type 2, controlled (HCC) 10/30/2014   Hyperlipidemia 10/30/2014   Renal failure (ARF), acute on chronic 10/30/2014   Sepsis (HCC) 10/30/2014   ULCER, LEG 09/25/2008   DIZZINESS 09/25/2008   DIABETES MELLITUS, TYPE II 09/23/2008   HYPERLIPIDEMIA 09/23/2008   GOUT 09/23/2008   Essential hypertension 09/23/2008   PSORIASIS, HX OF 09/23/2008   Past Medical History:  Diagnosis Date   Diabetes mellitus without complication (HCC)    Hyperlipidemia    Hypertension     Intervertebral disk disease    cervical   Obesity    Psoriasis    Tremor     Family History  Problem Relation Age of Onset   CAD Father     Past Surgical History:  Procedure Laterality Date   BACK SURGERY     I & D EXTREMITY Right 07/20/2018   Procedure: IRRIGATION AND DEBRIDEMENT  OF LEG;  Surgeon: Jerri Kay HERO, MD;  Location: [facility] OR;  Service: Orthopedics;  Laterality: Right;   LEG SURGERY     LUMBAR LAMINECTOMY  1989   Social History   Occupational History   Not on file  Tobacco Use   Smoking status: Former    Current packs/day: 0.00    Types: Cigarettes    Quit date: 07/23/1966    Years since quitting: 58.0   Smokeless tobacco: Never  Vaping Use   Vaping status: Never Used  Substance and Sexual Activity   Alcohol use: Not Currently    Comment: 1 a quarter   Drug use: Never   Sexual activity: Not on file

## 2024-08-23 ENCOUNTER — Other Ambulatory Visit (HOSPITAL_BASED_OUTPATIENT_CLINIC_OR_DEPARTMENT_OTHER): Payer: Self-pay

## 2024-08-26 ENCOUNTER — Other Ambulatory Visit (HOSPITAL_BASED_OUTPATIENT_CLINIC_OR_DEPARTMENT_OTHER): Payer: Self-pay

## 2024-08-26 ENCOUNTER — Encounter (HOSPITAL_BASED_OUTPATIENT_CLINIC_OR_DEPARTMENT_OTHER): Payer: Self-pay

## 2024-08-26 MED ORDER — BUPROPION HCL ER (XL) 300 MG PO TB24: 300.0000 mg | ORAL_TABLET | Freq: Every morning | ORAL | 3 refills | Status: AC

## 2024-08-26 MED ORDER — SERTRALINE HCL 50 MG PO TABS: 50.0000 mg | ORAL_TABLET | Freq: Every day | ORAL | 3 refills | Status: AC

## 2024-08-26 MED ORDER — ATORVASTATIN CALCIUM 80 MG PO TABS: 80.0000 mg | ORAL_TABLET | Freq: Every day | ORAL | 3 refills | Status: AC

## 2024-08-26 MED ORDER — INSULIN GLARGINE (1 UNIT DIAL) 300 UNIT/ML ~~LOC~~ SOPN: 20.0000 [IU] | PEN_INJECTOR | Freq: Every day | SUBCUTANEOUS | 1 refills | Status: AC

## 2024-08-26 MED ORDER — INSULIN PEN NEEDLE 32G X 4 MM MISC: 3 refills | Status: AC

## 2024-08-26 MED ORDER — INSULIN ASPART 100 UNIT/ML FLEXPEN: 2.0000 [IU] | PEN_INJECTOR | Freq: Three times a day (TID) | SUBCUTANEOUS | 1 refills | Status: AC

## 2024-08-26 MED ORDER — GABAPENTIN 300 MG PO CAPS: ORAL_CAPSULE | ORAL | 3 refills | Status: AC

## 2024-08-26 MED ORDER — DEXCOM G7 SENSOR MISC
3 refills | Status: AC
  Filled 2024-08-27: qty 9, 90d supply, fill #0

## 2024-08-27 ENCOUNTER — Other Ambulatory Visit (HOSPITAL_BASED_OUTPATIENT_CLINIC_OR_DEPARTMENT_OTHER): Payer: Self-pay

## 2024-08-27 ENCOUNTER — Other Ambulatory Visit: Payer: Self-pay

## 2024-09-03 ENCOUNTER — Institutional Professional Consult (permissible substitution): Admitting: Diagnostic Neuroimaging
